# Patient Record
Sex: Female | Born: 1980 | Race: White | Hispanic: No | Marital: Married | Smoking: Never smoker
Health system: Southern US, Community
[De-identification: ages and names within clinical notes are randomized; demographics above are authoritative.]

## PROBLEM LIST (undated history)

## (undated) DIAGNOSIS — K219 Gastro-esophageal reflux disease without esophagitis: Secondary | ICD-10-CM

## (undated) DIAGNOSIS — E162 Hypoglycemia, unspecified: Secondary | ICD-10-CM

## (undated) DIAGNOSIS — R260 Ataxic gait: Secondary | ICD-10-CM

## (undated) DIAGNOSIS — M779 Enthesopathy, unspecified: Secondary | ICD-10-CM

## (undated) DIAGNOSIS — M5020 Other cervical disc displacement, unspecified cervical region: Secondary | ICD-10-CM

## (undated) DIAGNOSIS — G43909 Migraine, unspecified, not intractable, without status migrainosus: Secondary | ICD-10-CM

## (undated) DIAGNOSIS — N200 Calculus of kidney: Secondary | ICD-10-CM

## (undated) HISTORY — DX: Gastro-esophageal reflux disease without esophagitis: K21.9

## (undated) HISTORY — PX: WISDOM TOOTH EXTRACTION: SHX21

## (undated) HISTORY — PX: DILATION AND CURETTAGE OF UTERUS: SHX78

## (undated) HISTORY — DX: Hypoglycemia, unspecified: E16.2

## (undated) HISTORY — DX: Migraine, unspecified, not intractable, without status migrainosus: G43.909

## (undated) HISTORY — PX: ANKLE SURGERY: SHX546

---

## 2011-03-26 ENCOUNTER — Other Ambulatory Visit: Payer: Self-pay | Admitting: Family Medicine

## 2011-03-27 ENCOUNTER — Ambulatory Visit
Admission: RE | Admit: 2011-03-27 | Discharge: 2011-03-27 | Disposition: A | Discharge status: Home / Self Care | Payer: Self-pay | Source: Ambulatory Visit | Attending: Family Medicine | Admitting: Family Medicine

## 2011-03-27 IMAGING — US US ABDOMEN COMPLETE
1 series · 14 of 25 positions shown · non-contrast
Comparison: None.

CLINICAL DATA: Epigastric pain with nausea.

COMPLETE ABDOMINAL ULTRASOUND

[Series 1: us abdomen complete · 0.28mm/px · 14 of 73 slices shown]
[im 1/73]
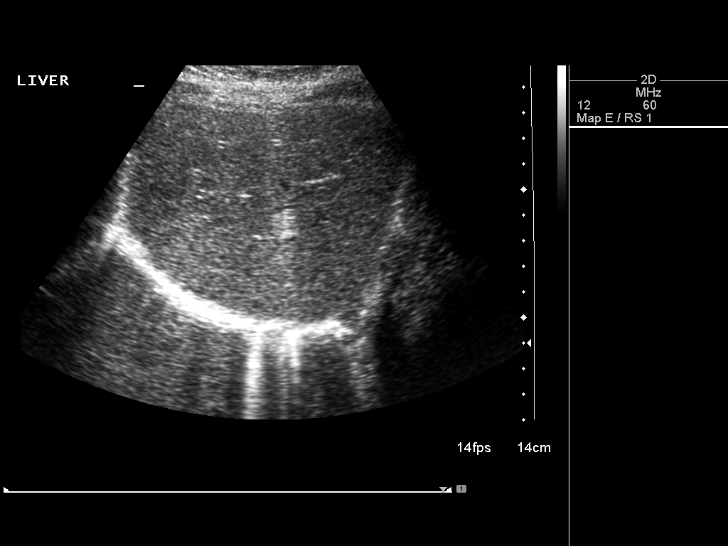
[im 7/73]
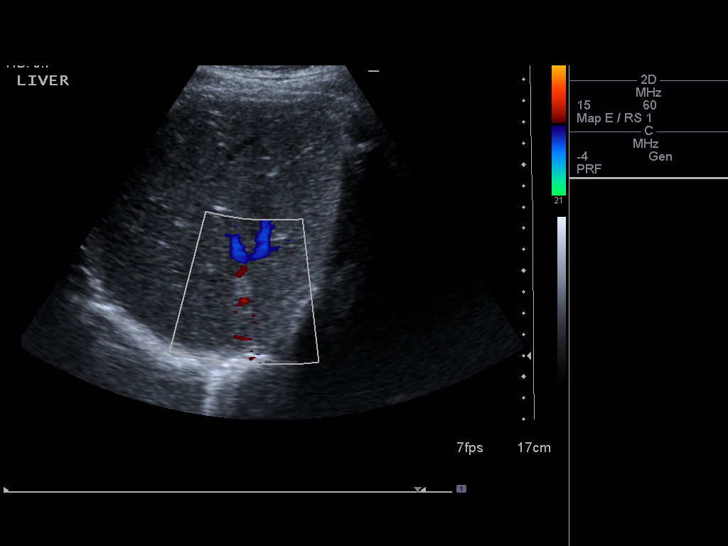
[im 13/73]
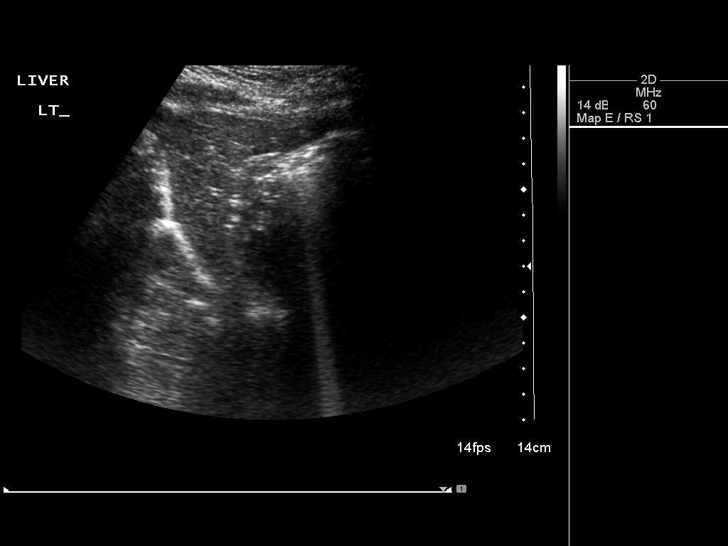
[im 19/73]
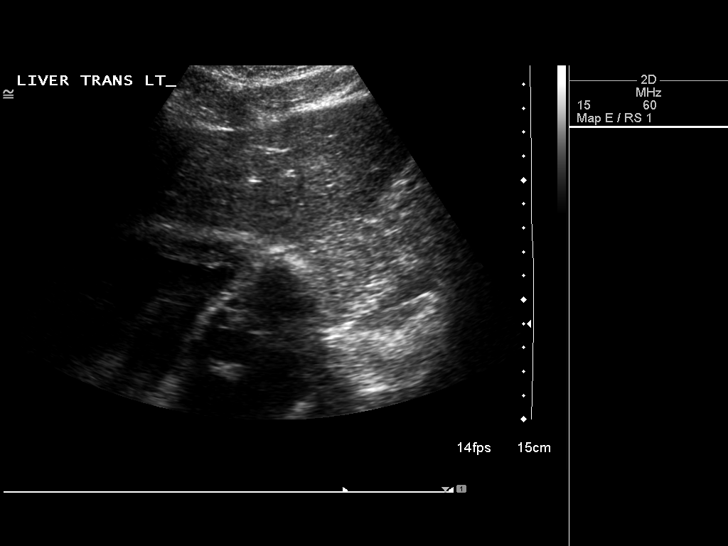
[im 25/73]
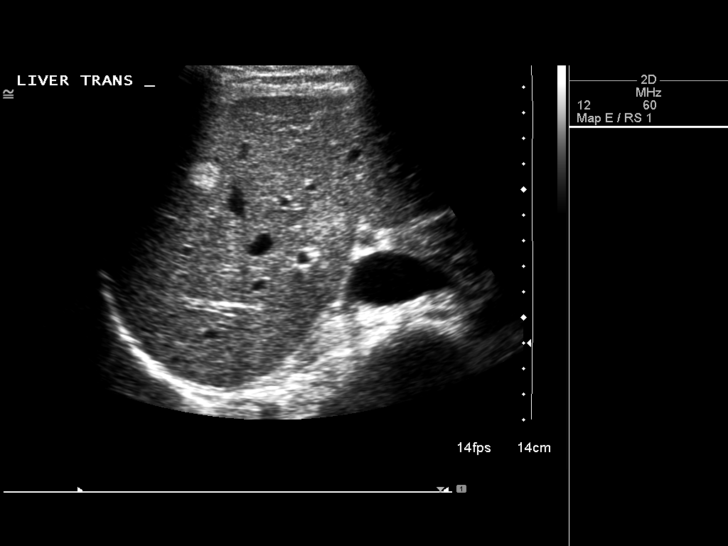
[im 28/73]
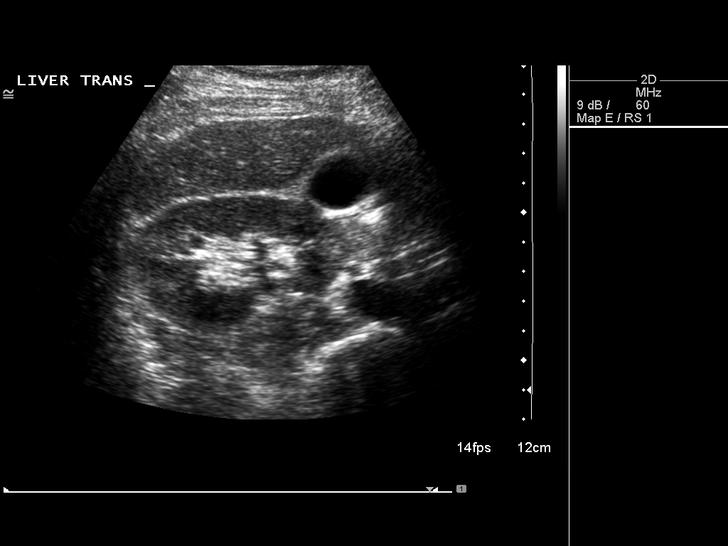
[im 34/73]
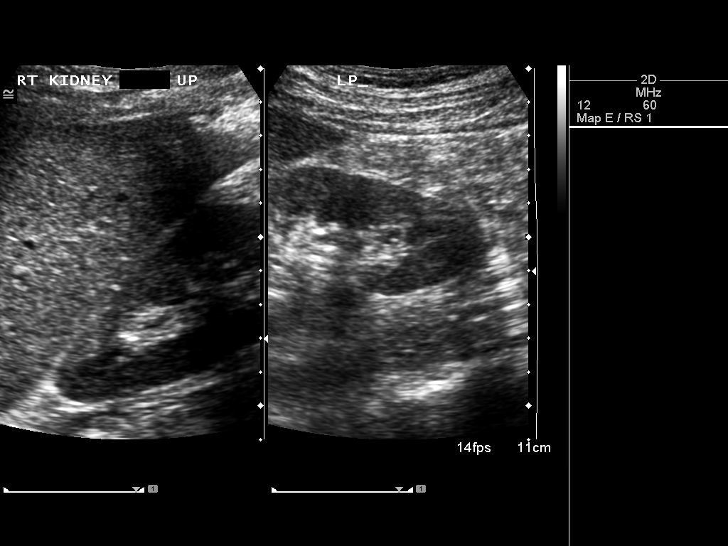
[im 40/73]
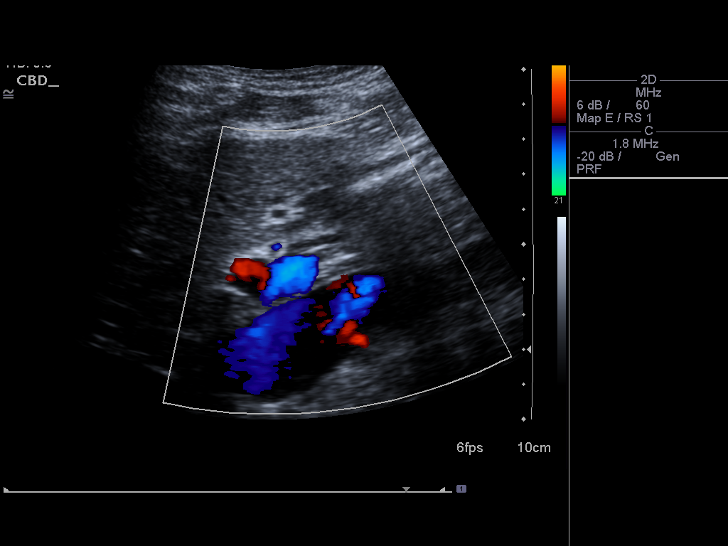
[im 46/73]
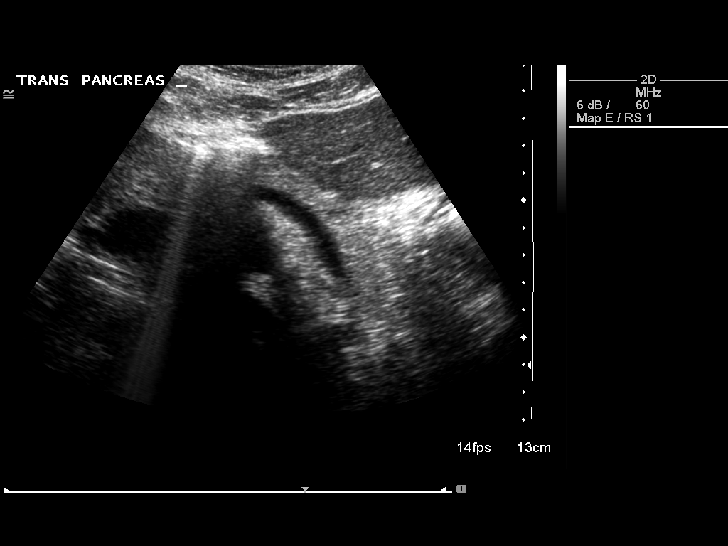
[im 49/73]
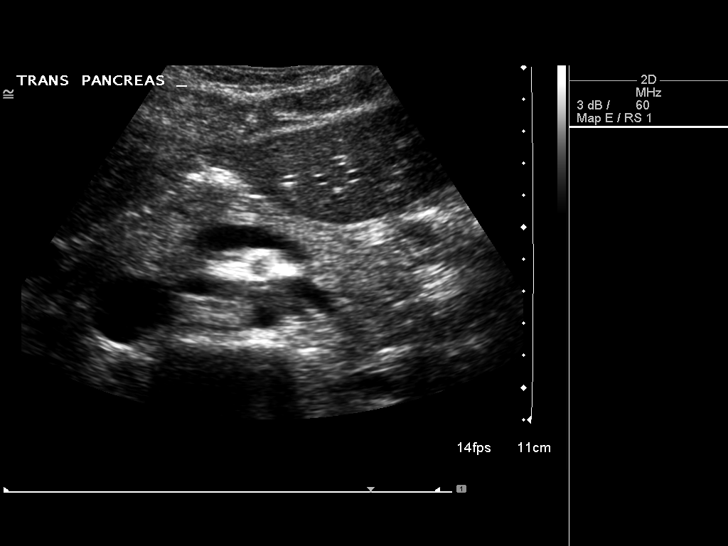
[im 55/73]
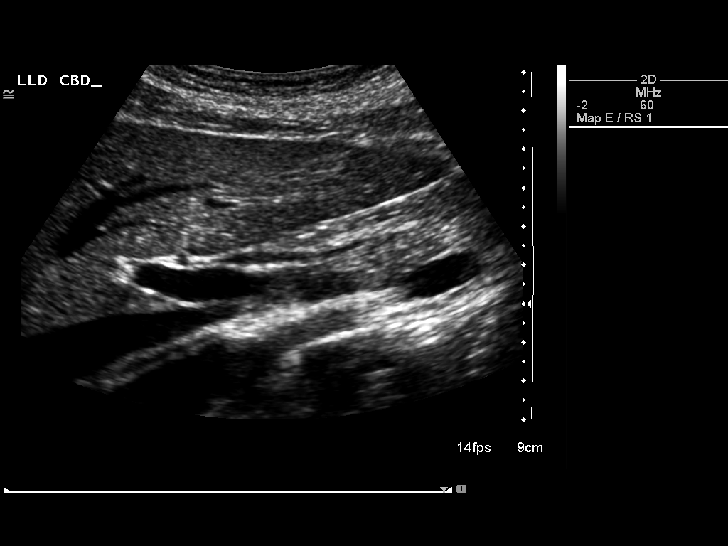
[im 61/73]
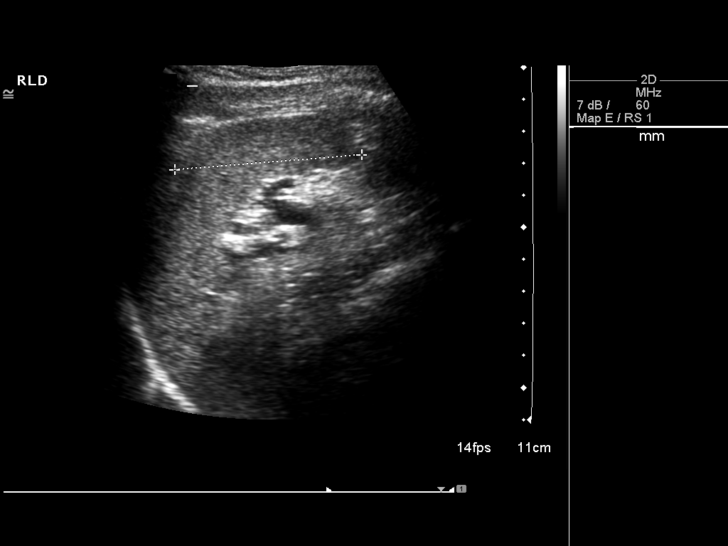
[im 67/73]
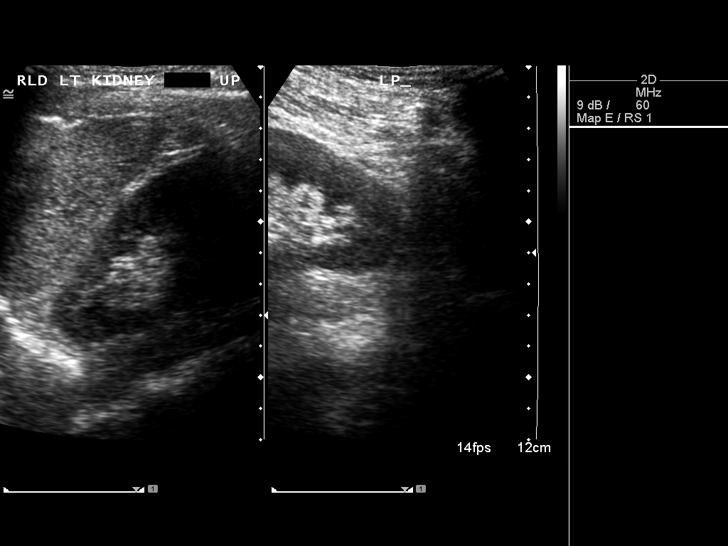
[im 73/73]
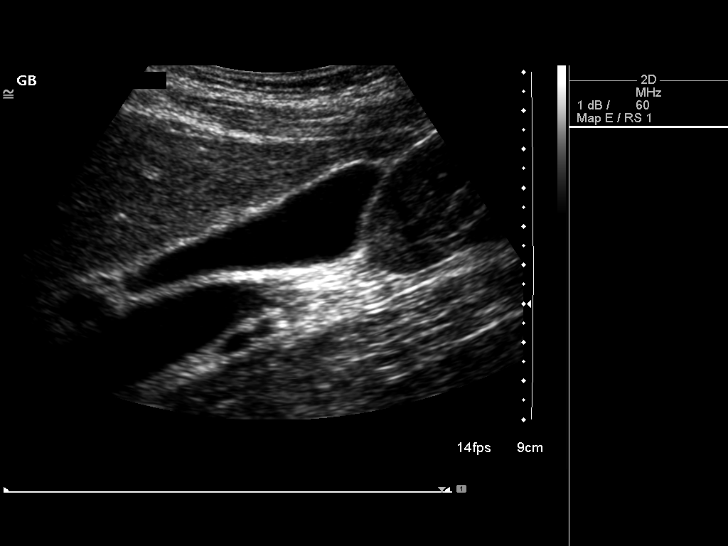

[14 of 25 positions shown; findings below may reference images not displayed]

FINDINGS: Gallbladder:  Negative.

Common bile duct:  Measures 3 mm, within normal limits.

Liver:  Uniform parenchymal echo texture with the exception of two
hyperechoic lesions in the right hepatic lobe, measuring up to
x 2.0 x 2.1 cm.  In the absence of known malignancy, these are
likely hemangiomas.

IVC:  Visualized.

Pancreas:  Negative.

Spleen:  Measures 5.6 cm, negative.

Right Kidney:  Measures 11.2 cm with uniform parenchymal echo
texture.  No hydronephrosis.

Left Kidney:  Measures 11.9 cm with uniform parenchymal echo
texture.  No hydronephrosis.

Abdominal aorta:  No aneurysm identified.
IMPRESSION: No acute findings.

## 2013-08-17 ENCOUNTER — Emergency Department (HOSPITAL_COMMUNITY): Payer: Managed Care, Other (non HMO)

## 2013-08-17 ENCOUNTER — Emergency Department (HOSPITAL_COMMUNITY)
Admission: EM | Admit: 2013-08-17 | Discharge: 2013-08-18 | Disposition: A | Discharge status: Home / Self Care | Payer: Managed Care, Other (non HMO) | Attending: Emergency Medicine | Admitting: Emergency Medicine

## 2013-08-17 ENCOUNTER — Encounter (HOSPITAL_COMMUNITY): Payer: Self-pay | Admitting: Emergency Medicine

## 2013-08-17 DIAGNOSIS — K529 Noninfective gastroenteritis and colitis, unspecified: Secondary | ICD-10-CM

## 2013-08-17 DIAGNOSIS — Z87442 Personal history of urinary calculi: Secondary | ICD-10-CM | POA: Insufficient documentation

## 2013-08-17 DIAGNOSIS — Z3202 Encounter for pregnancy test, result negative: Secondary | ICD-10-CM | POA: Insufficient documentation

## 2013-08-17 DIAGNOSIS — K5289 Other specified noninfective gastroenteritis and colitis: Secondary | ICD-10-CM | POA: Insufficient documentation

## 2013-08-17 DIAGNOSIS — Z79899 Other long term (current) drug therapy: Secondary | ICD-10-CM | POA: Insufficient documentation

## 2013-08-17 HISTORY — DX: Calculus of kidney: N20.0

## 2013-08-17 LAB — HEPATIC FUNCTION PANEL
ALT: 18 U/L (ref 0–35)
AST: 24 U/L (ref 0–37)
Albumin: 4 g/dL (ref 3.5–5.2)
Alkaline Phosphatase: 64 U/L (ref 39–117)
Bilirubin, Direct: 0.2 mg/dL (ref 0.0–0.3)
Indirect Bilirubin: 0.6 mg/dL (ref 0.3–0.9)
Total Bilirubin: 0.8 mg/dL (ref 0.3–1.2)
Total Protein: 7.1 g/dL (ref 6.0–8.3)

## 2013-08-17 LAB — CBC WITH DIFFERENTIAL/PLATELET
Basophils Absolute: 0 10*3/uL (ref 0.0–0.1)
Basophils Relative: 0 % (ref 0–1)
Eosinophils Relative: 0 % (ref 0–5)
HCT: 41 % (ref 36.0–46.0)
MCHC: 34.4 g/dL (ref 30.0–36.0)
MCV: 96.5 fL (ref 78.0–100.0)
Monocytes Absolute: 0.5 10*3/uL (ref 0.1–1.0)
Monocytes Relative: 4 % (ref 3–12)
RDW: 12.3 % (ref 11.5–15.5)

## 2013-08-17 LAB — URINALYSIS, ROUTINE W REFLEX MICROSCOPIC
Bilirubin Urine: NEGATIVE
Glucose, UA: NEGATIVE mg/dL
Hgb urine dipstick: NEGATIVE
Ketones, ur: >80 mg/dL — AB
Leukocytes, UA: NEGATIVE
Protein, ur: NEGATIVE mg/dL
Specific Gravity, Urine: 1.024 (ref 1.005–1.030)

## 2013-08-17 LAB — BASIC METABOLIC PANEL
BUN: 12 mg/dL (ref 6–23)
CO2: 24 mEq/L (ref 19–32)
Calcium: 9.3 mg/dL (ref 8.4–10.5)
Creatinine, Ser: 0.61 mg/dL (ref 0.50–1.10)
GFR calc Af Amer: >90 mL/min (ref >90)

## 2013-08-17 LAB — WET PREP, GENITAL: Trich, Wet Prep: NONE SEEN

## 2013-08-17 LAB — LIPASE, BLOOD: Lipase: 15 U/L (ref 11–59)

## 2013-08-17 LAB — POCT PREGNANCY, URINE: Preg Test, Ur: NEGATIVE

## 2013-08-17 IMAGING — US US ABDOMEN COMPLETE
1 series · 14 of 25 positions shown · non-contrast
Comparison: [DATE]

CLINICAL DATA: Abdominal pain

EXAM:
ULTRASOUND ABDOMEN COMPLETE

[Series 1: us abdomen complete · 0.24mm/px · 14 of 85 slices shown]
[im 1/85]
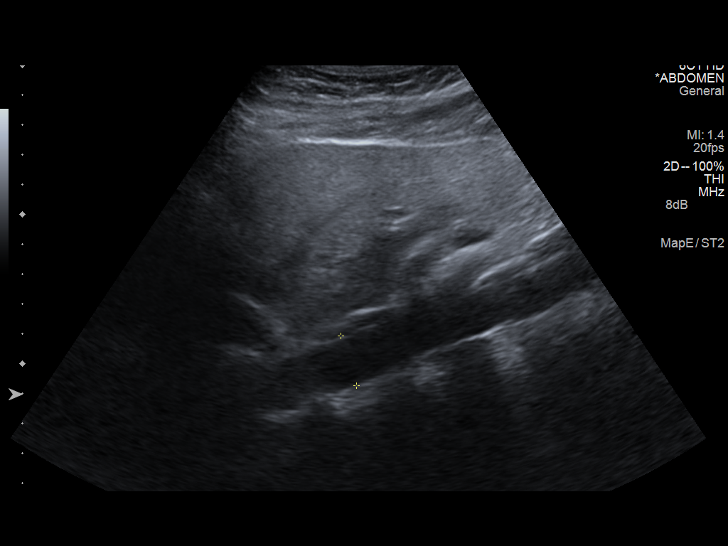
[im 8/85]
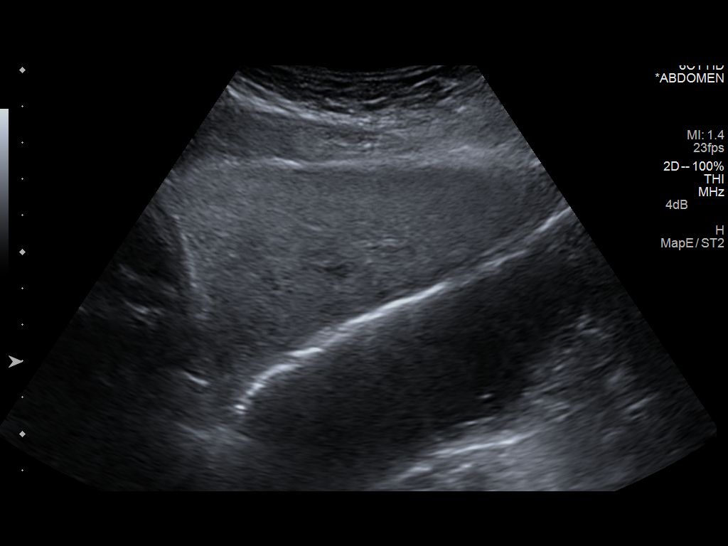
[im 15/85]
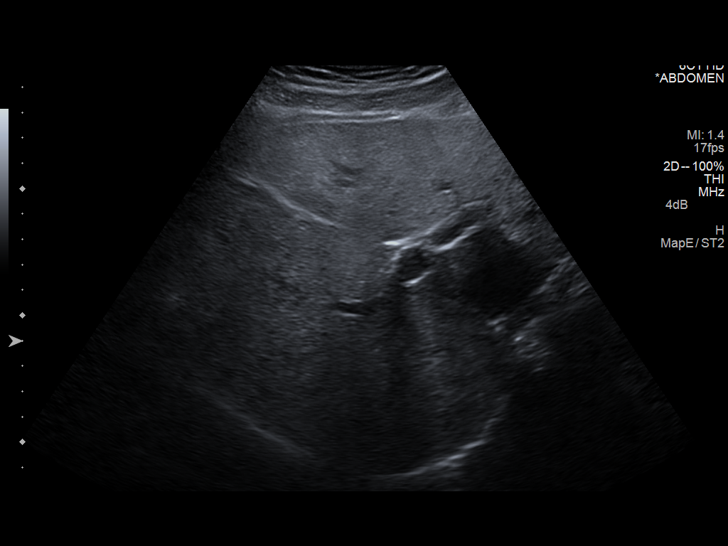
[im 22/85]
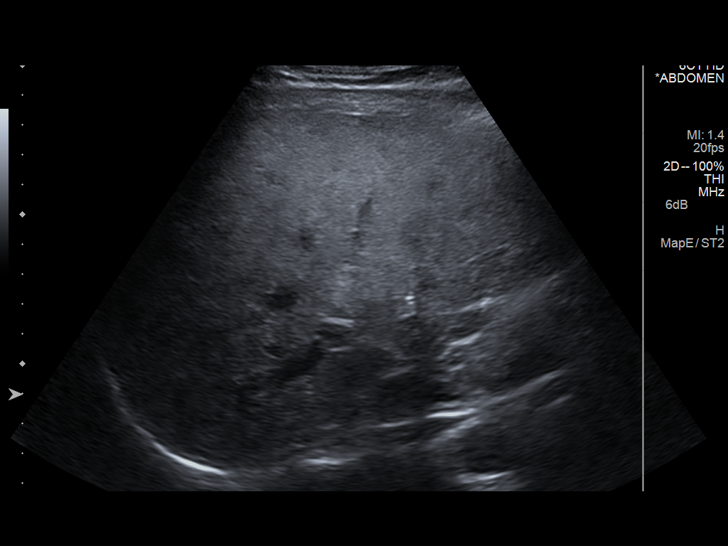
[im 29/85]
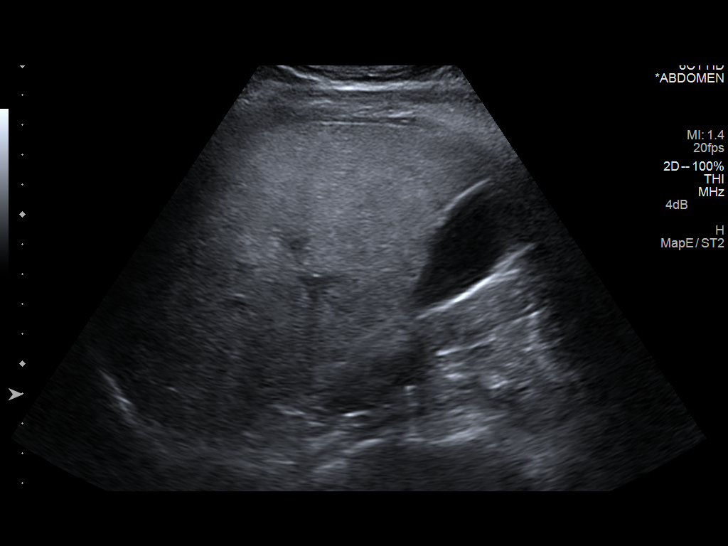
[im 32/85]
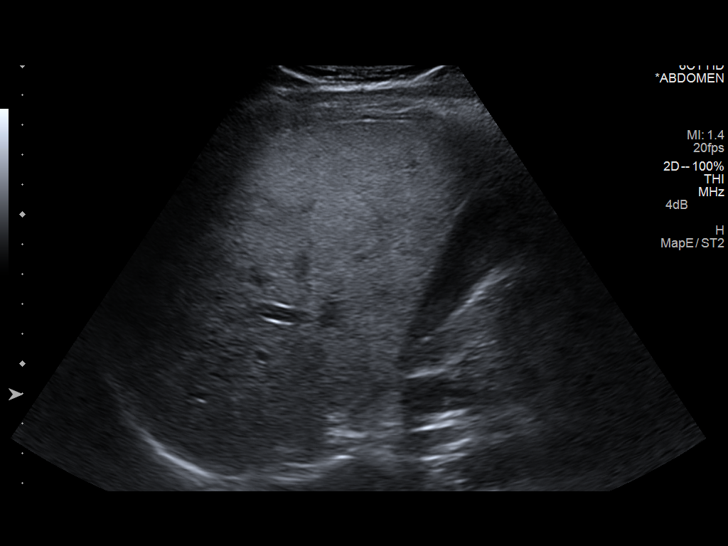
[im 39/85]
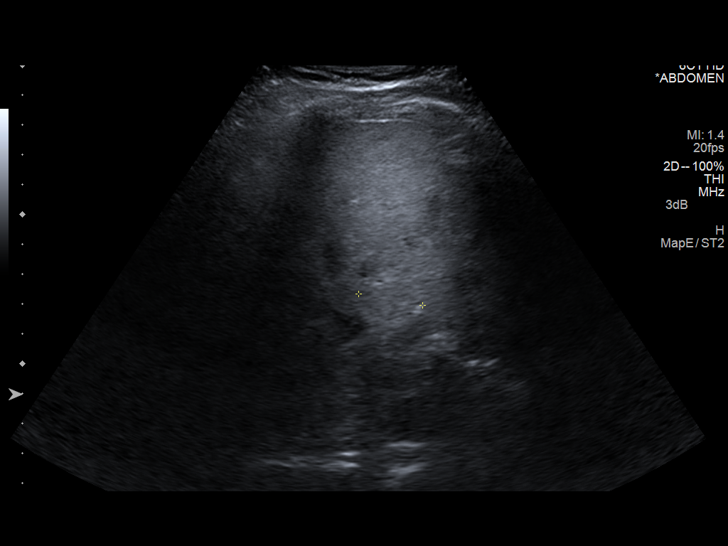
[im 46/85]
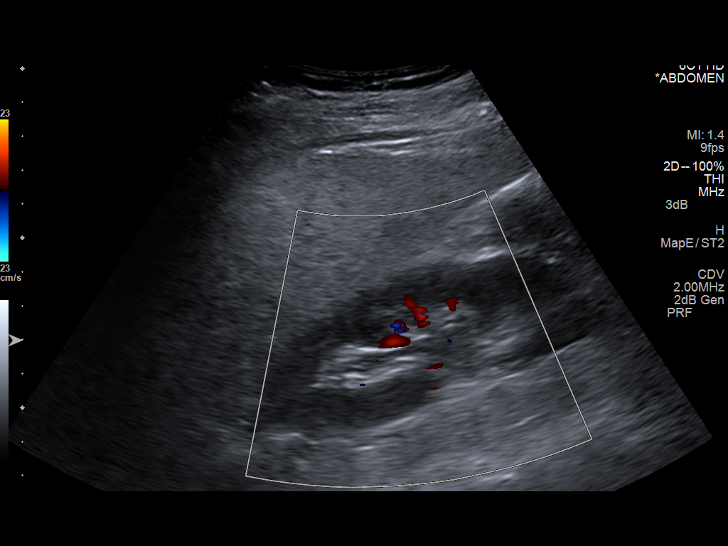
[im 53/85]
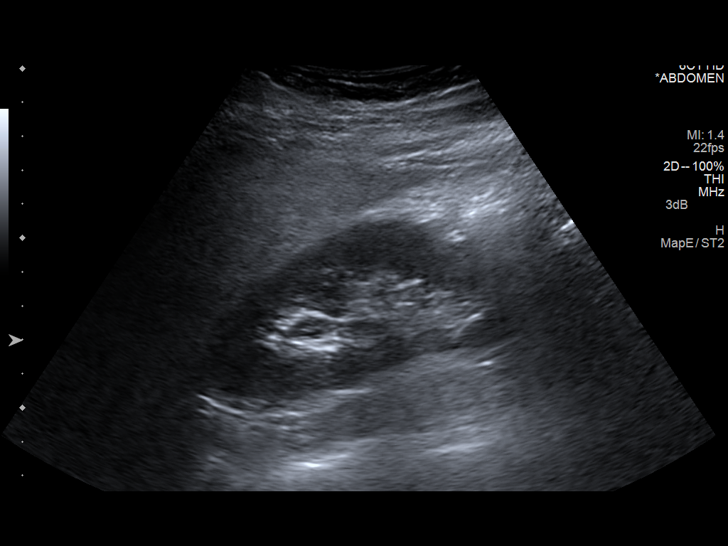
[im 57/85]
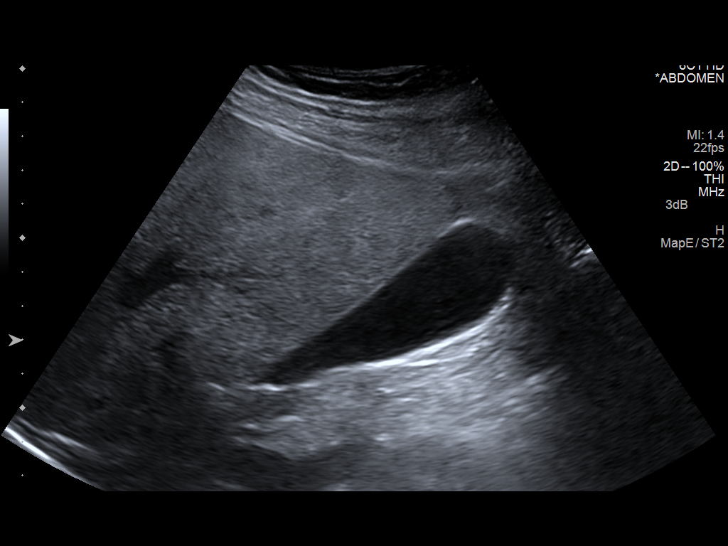
[im 64/85]
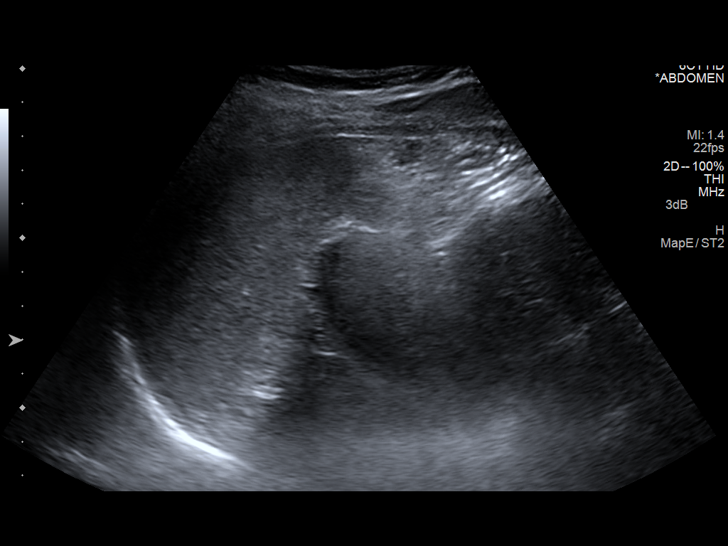
[im 71/85]
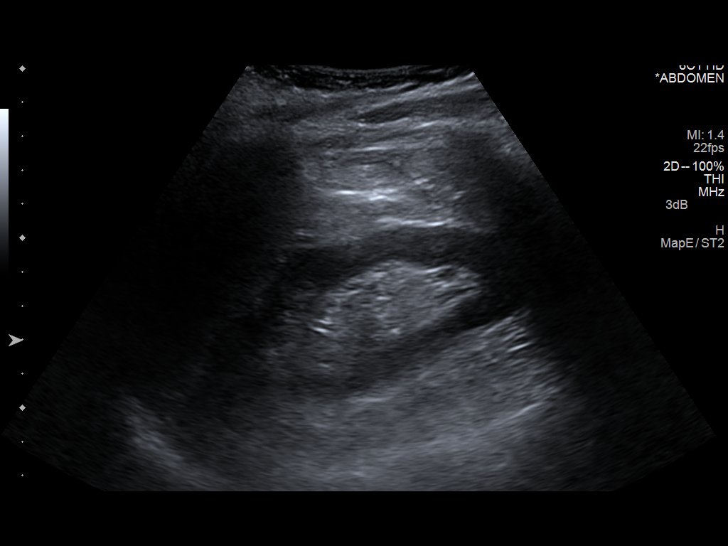
[im 78/85]
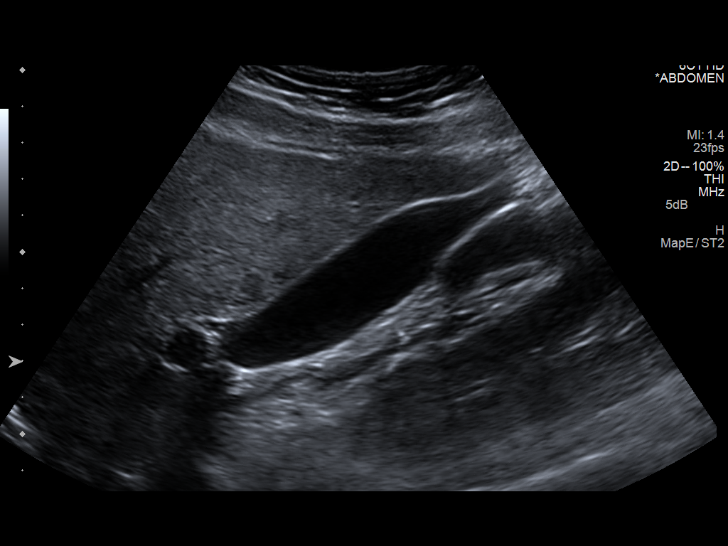
[im 85/85]
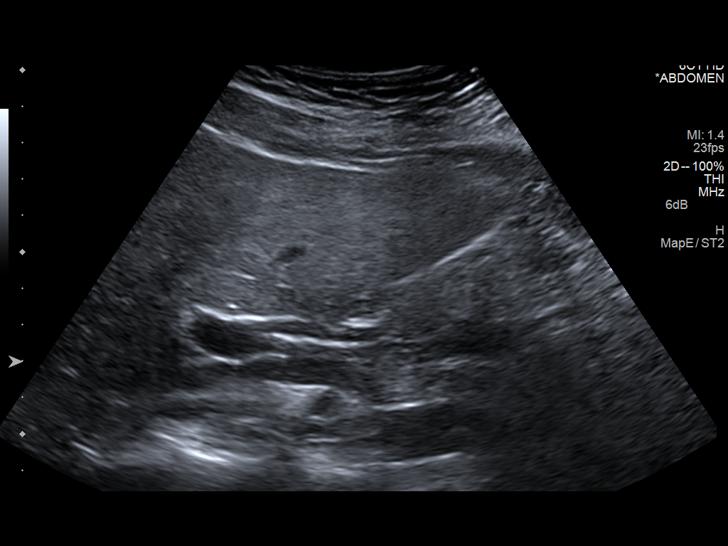

[14 of 25 positions shown; findings below may reference images not displayed]

FINDINGS: Gallbladder

No gallstones or wall thickening visualized. No sonographic Murphy
sign noted.

Common bile duct

Diameter: 4 mm

Liver

There is somewhat heterogeneous attenuation diffusely with a rounded
hyperechoic area measuring about 2 cm centrally in the right lobe.
This area appears similar to the prior study without significant
interval change.

IVC

No abnormality visualized.

Pancreas

Visualized portion unremarkable.

Spleen

Size and appearance within normal limits.

Right Kidney

Length: 10.6 cm Echogenicity within normal limits. No mass or
hydronephrosis visualized.

Left Kidney

Length: 10.8 cm Echogenicity within normal limits. No mass or
hydronephrosis visualized.

Abdominal aorta

No aneurysm visualized.
IMPRESSION: No acute abnormalities. Stable hyperechoic area of right liver
likely a hemangioma given the stability.

## 2013-08-17 IMAGING — CT CT ABD-PELV W/ CM
2 of 3 series · 12 of 36 positions shown, 17 images · IV contrast (OMNIPAQUE 300)
Comparison: Ultrasound [DATE]

CLINICAL DATA: Lower pelvic cramping.

EXAM:
CT ABDOMEN AND PELVIS WITH CONTRAST
TECHNIQUE: Multidetector CT imaging of the abdomen and pelvis was performed
using the standard protocol following bolus administration of
intravenous contrast.
CONTRAST:  100mL OMNIPAQUE IOHEXOL 300 MG/ML  SOLN

[Series 2: abd/pel with · axial · 0.83mm/px · z∈[+1158,+1544]mm · 11 of 89 slices shown, 15 images]
[im 8/89  soft-tissue]
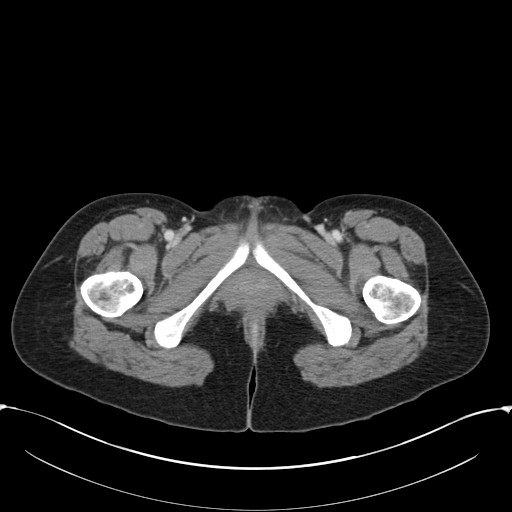
[im 8/89  bone]
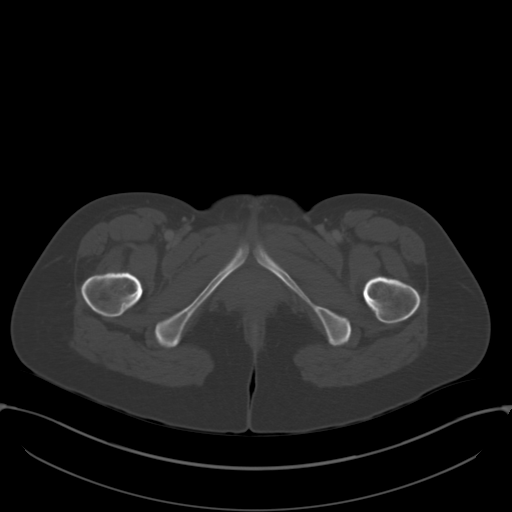
[im 16/89  soft-tissue]
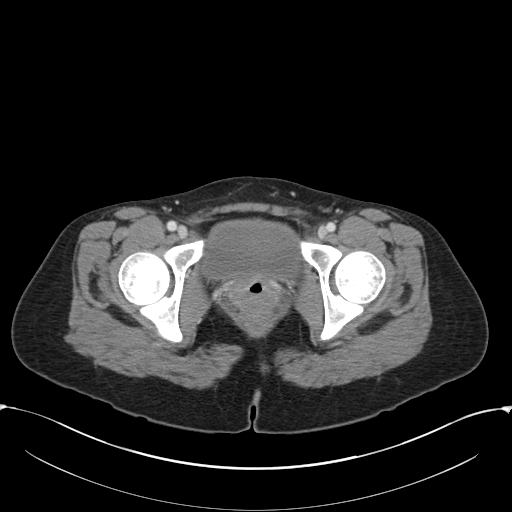
[im 27/89  soft-tissue]
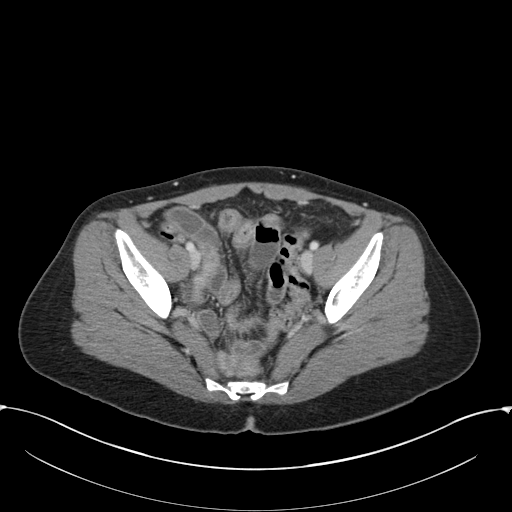
[im 35/89  soft-tissue]
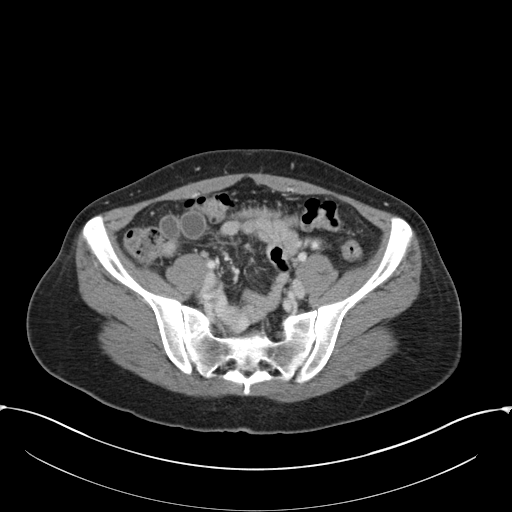
[im 46/89  soft-tissue]
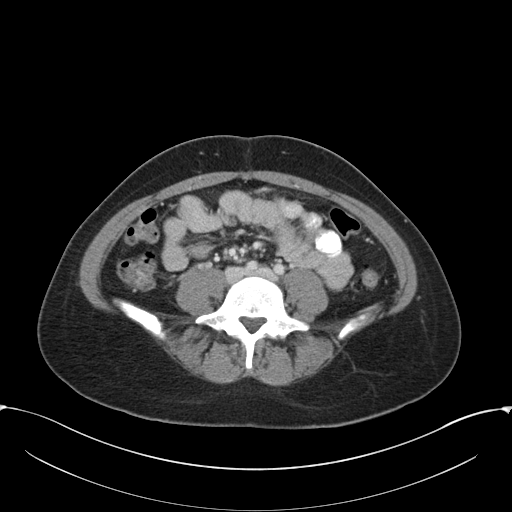
[im 54/89  soft-tissue]
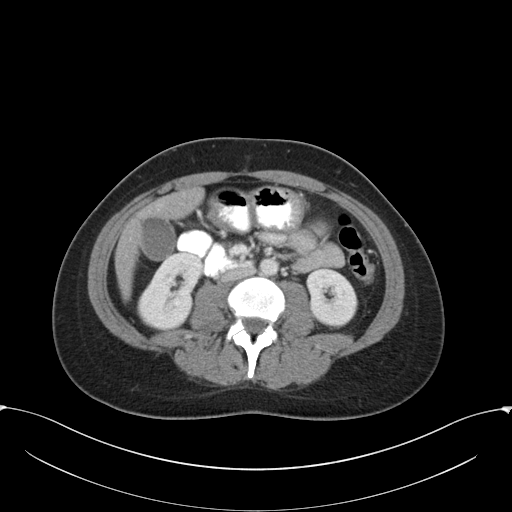
[im 62/89  soft-tissue]
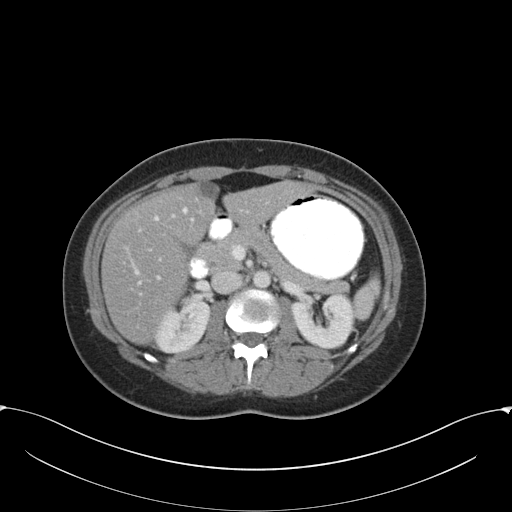
[im 73/89  soft-tissue]
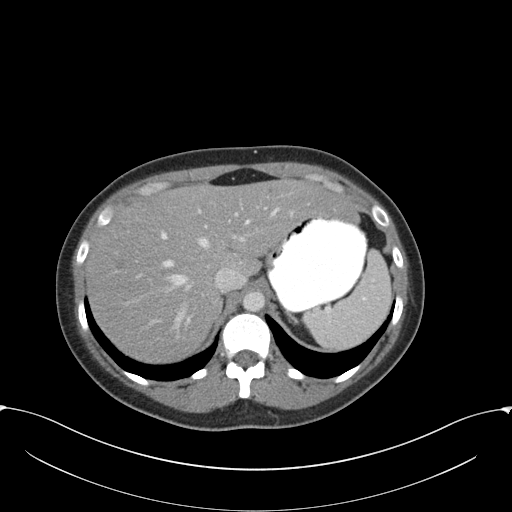
[im 73/89  lung]
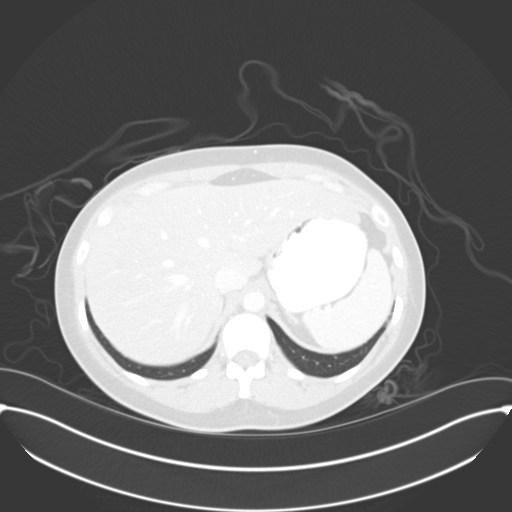
[im 77/89  lung]
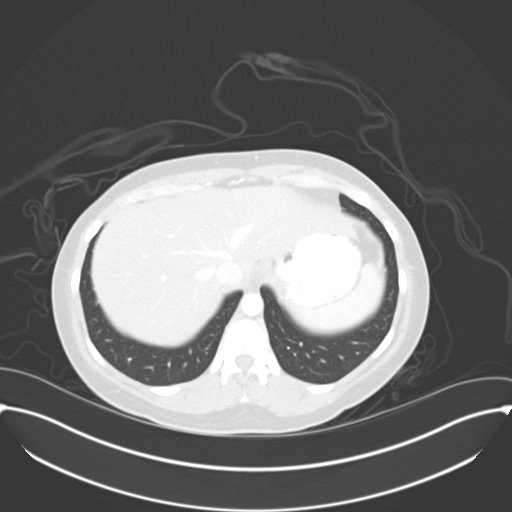
[im 81/89  soft-tissue]
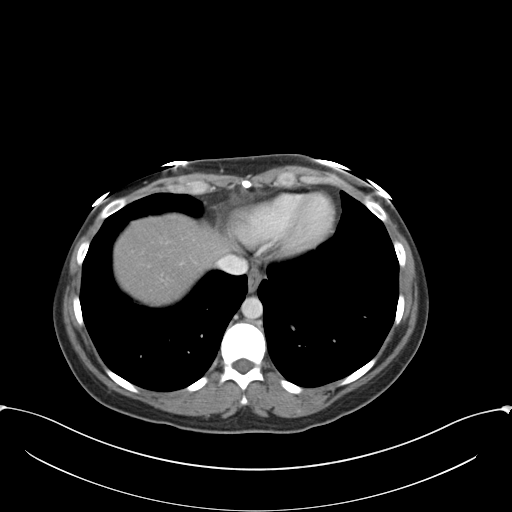
[im 81/89  lung]
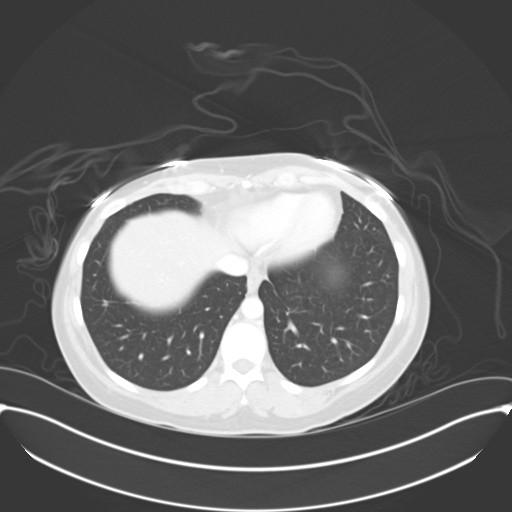
[im 81/89  bone]
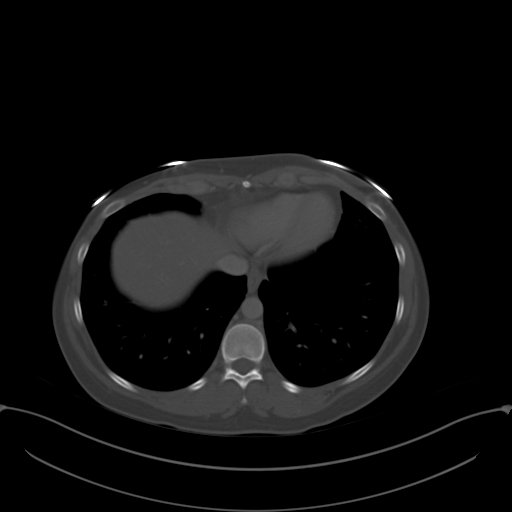
[im 85/89  lung]
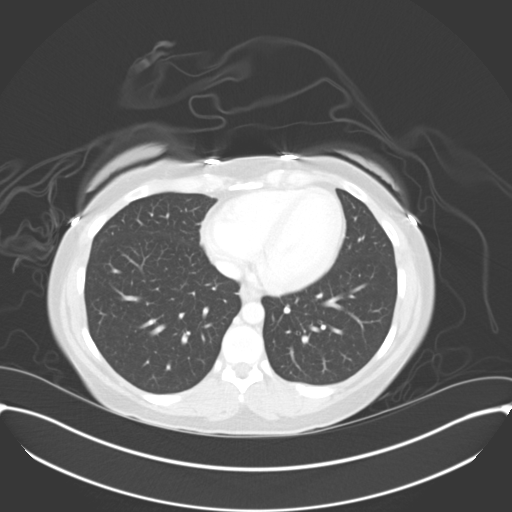

[Series 4: sagittal a/ p · sagittal · 0.54mm/px · 1 of 127 slices shown, 2 images]
[im 43/127  soft-tissue]
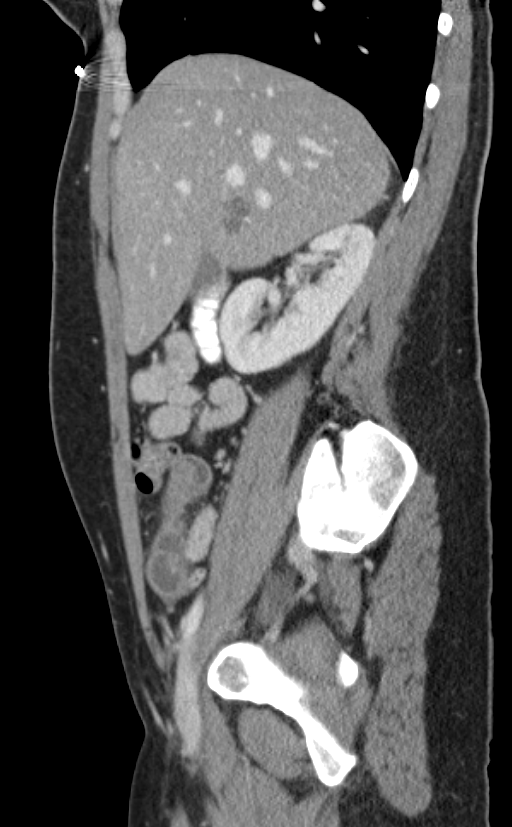
[im 43/127  bone]
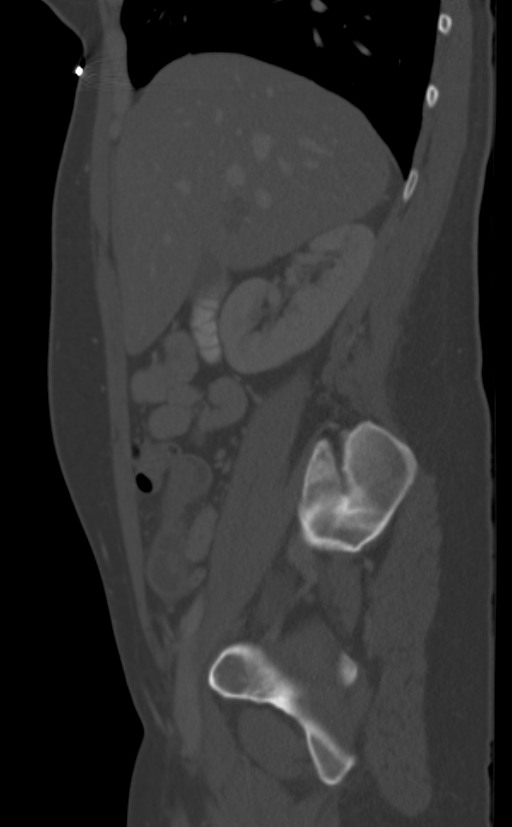

[12 of 36 positions shown; findings below may reference images not displayed]

FINDINGS: Lung bases are clear.  Negative for free air.

Low-attenuation of liver is suggestive for hepatic steatosis. There
is a 2.3 cm low-density structure within the medial right hepatic
lobe. There appears to have internal vascularity. Structure is
echogenic on the recent ultrasound and most likely represents a
cavernous hemangioma. Focal low-density area along the medial left
hepatic lobe may represent focal fat. There are additional subtle
low-density areas in the liver that are nonspecific.

Portal venous system is patent. Normal appearance of the
gallbladder. Normal appearance of the spleen. Normal appearance of
the adrenal glands, kidneys and pancreas. No significant
lymphadenopathy in the abdomen or pelvis. Small amount of free fluid
in the pelvis. The appendix appears normal. There are some
fluid-filled loops of small bowel in the pelvis with wall
enhancement. In addition, few of the small bowel loops demonstrate
mild wall thickening. Findings raise concern for small bowel
enteritis. No gross abnormality to the uterus. Difficult to evaluate
the adnexal structures or ovaries. There may be a small right corpus
luteal cyst. No acute bone abnormality.
IMPRESSION: The small bowel structures in the pelvis are slightly hyperemic and
a few loops demonstrate mild wall thickening. There is also a small
amount of free fluid in the pelvis. Findings could represent
enteritis.

Low-density of the liver is suggestive for hepatic steatosis. There
are low-density structures in the liver which are probably
incidental findings in a patient of this age. Largest structure
measures up to 2.3 cm and suspect this represents a cavernous
hemangioma based on the previous ultrasound findings.

## 2013-08-17 MED ORDER — OXYCODONE-ACETAMINOPHEN 5-325 MG PO TABS
1.0000 | ORAL_TABLET | Freq: Once | ORAL | Status: AC
  Administered 2013-08-17: 1 via ORAL
  Filled 2013-08-17: qty 1

## 2013-08-17 MED ORDER — ONDANSETRON HCL 4 MG/2ML IJ SOLN
4.0000 mg | Freq: Once | INTRAMUSCULAR | Status: AC
  Administered 2013-08-17: 4 mg via INTRAVENOUS
  Filled 2013-08-17: qty 2

## 2013-08-17 MED ORDER — ONDANSETRON HCL 4 MG/2ML IJ SOLN
4.0000 mg | Freq: Once | INTRAMUSCULAR | Status: AC
Start: 2013-08-17 — End: 2013-08-17
  Administered 2013-08-17: 4 mg via INTRAVENOUS
  Filled 2013-08-17: qty 2

## 2013-08-17 MED ORDER — IOHEXOL 300 MG/ML  SOLN
50.0000 mL | Freq: Once | INTRAMUSCULAR | Status: AC | PRN
  Administered 2013-08-17: 50 mL via ORAL

## 2013-08-17 MED ORDER — MORPHINE SULFATE 4 MG/ML IJ SOLN
4.0000 mg | Freq: Once | INTRAMUSCULAR | Status: AC
  Administered 2013-08-17: 4 mg via INTRAVENOUS
  Filled 2013-08-17: qty 1

## 2013-08-17 MED ORDER — IOHEXOL 300 MG/ML  SOLN
100.0000 mL | Freq: Once | INTRAMUSCULAR | Status: AC | PRN
  Administered 2013-08-17: 100 mL via INTRAVENOUS

## 2013-08-17 MED ORDER — HYDROCODONE-ACETAMINOPHEN 5-325 MG PO TABS: 1.0000 | ORAL_TABLET | ORAL | Status: DC | PRN

## 2013-08-17 MED ORDER — SODIUM CHLORIDE 0.9 % IV BOLUS (SEPSIS)
1000.0000 mL | Freq: Once | INTRAVENOUS | Status: AC
  Administered 2013-08-17: 1000 mL via INTRAVENOUS

## 2013-08-17 MED ORDER — ONDANSETRON HCL 4 MG PO TABS: 4.0000 mg | ORAL_TABLET | Freq: Four times a day (QID) | ORAL | Status: DC

## 2013-08-17 NOTE — ED Notes (Signed)
Per pt, woke up with abdominal pain/spasms-went to walk in clinic and they sent her here for eval

## 2013-08-17 NOTE — ED Notes (Signed)
US at bedside

## 2013-08-17 NOTE — ED Provider Notes (Signed)
CSN: [id number]     Arrival date & time 08/17/13  1508 History   First MD Initiated Contact with Patient 08/17/13 1641     Chief Complaint  Patient presents with  . Abdominal Pain   (Consider location/radiation/quality/duration/timing/severity/associated sxs/prior Treatment) HPI  32 year old female with history of kidney stones presents complaining of abdominal pain. Patient reports acute onset of sharp pain to the upper abdomen which wakes up this morning followed by episodes of muscle spasm. Pain radiates across her abdomen, has been persistent, causing her have nausea and has vomited 4 or 5 times. Vomitus is nonbloody, nonbilious. Nothing seems to make the pain better, abdominal spasm seems to make it worse. No specific treatment tried. Continues to endorse nausea. She was seen at the walk-in clinic today and was noted to have a low-grade fever. She was sent to ER for further evaluation. Patient denies headache, sneeze, cough, chest pain, shortness of breath , back pain, shoulder pain, dysuria, hematuria, vaginal bleeding, vaginal discharge, or rash. Her last menstrual period was September 21. Patient reports she has history of kidney stones but this felt different. Patient admits to taking ibuprofen on a regular basis for neck pain. She also admits to drinking 1-2 glasses of wine 4-5 times a week. She denies any history of GERD. Denies any abdominal surgery. Her last bowel movement was this morning and was unremarkable.  Past Medical History  Diagnosis Date  . Kidney stones    Past Surgical History  Procedure Laterality Date  . Ankle surgery     No family history on file. History  Substance Use Topics  . Smoking status: Never Smoker   . Smokeless tobacco: Not on file  . Alcohol Use: Yes   OB History   Grav Para Term Preterm Abortions TAB SAB Ect Mult Living                 Review of Systems  All other systems reviewed and are negative.    Allergies  Review of patient's  allergies indicates no known allergies.  Home Medications   Current Outpatient Rx  Name  Route  Sig  Dispense  Refill  . FLUoxetine (PROZAC) 20 MG capsule   Oral   Take 20 mg by mouth daily.         Marland Kitchen lidocaine (LIDODERM) 5 %   Transdermal   Place 1 patch onto the skin daily. Remove & Discard patch within 12 hours or as directed by MD          BP 134/88  Pulse 102  Temp(Src) 99.4 F (37.4 C) (Oral)  Resp 18  SpO2 100%  LMP 07/25/2013 Physical Exam  Nursing note and vitals reviewed. Constitutional: She appears well-developed and well-nourished. No distress.  HENT:  Head: Normocephalic and atraumatic.  Eyes: Conjunctivae are normal.  Neck: Normal range of motion. Neck supple.  Cardiovascular: Normal rate and regular rhythm.   Pulmonary/Chest: Effort normal and breath sounds normal. She exhibits no tenderness.  Abdominal: Soft. There is tenderness (diffuse abdominal tenderness on palpation without focal point tenderness. No guarding or rebound tenderness. No hernia noted. No overlying skin changes.).  Genitourinary: Vagina normal and uterus normal. There is no rash or lesion on the right labia. There is no rash or lesion on the left labia. Cervix exhibits no motion tenderness and no discharge. Right adnexum displays no mass and no tenderness. Left adnexum displays no mass and no tenderness. No erythema, tenderness or bleeding around the vagina. No vaginal discharge found.  Chaperone present:  Lymphadenopathy:       Right: No inguinal adenopathy present.       Left: No inguinal adenopathy present.    ED Course  Procedures (including critical care time)  5:09 PM Patient with abdominal pain. Pain is nonfocal. Workup initiated.  No significant discomfort on pelvic exam.    10:00 PM Ultrasound shows no acute finding. There is a hyperechoic area of the right liver likely a hemangioma, and not related to the patient's present symptoms. Abdominal and pelvis CT scan were  performed showing evidence of enteritis. Otherwise the appendix was seen and normal in size. Patient does endorse abdominal pain. Ketones is greater than 80 urine however she has received fluids here, she request for fluid to drain. We'll continue to monitor patient and can provide symptom control. I anticipate patient can be discharged. Patient voiced understanding and agrees with plan.  11:24 PM Patient reports feeling better, able to tolerates by mouth. The patient stable for discharge. Her symptoms likely viral. I do not think an antibiotic is necessary at this time. Return precautions discussed. Will discharge patient with antinausea medication and pain medication. No recent travel concerning for ebola virus.    Labs Review Labs Reviewed  WET PREP, GENITAL - Abnormal; Notable for the following:    Yeast Wet Prep HPF POC FEW (*)    Clue Cells Wet Prep HPF POC FEW (*)    WBC, Wet Prep HPF POC FEW (*)    All other components within normal limits  CBC WITH DIFFERENTIAL - Abnormal; Notable for the following:    WBC 11.9 (*)    Neutrophils Relative % 92 (*)    Neutro Abs 10.9 (*)    Lymphocytes Relative 4 (*)    Lymphs Abs 0.5 (*)    All other components within normal limits  URINALYSIS, ROUTINE W REFLEX MICROSCOPIC - Abnormal; Notable for the following:    Ketones, ur >80 (*)    All other components within normal limits  GC/CHLAMYDIA PROBE AMP  BASIC METABOLIC PANEL  LIPASE, BLOOD  HEPATIC FUNCTION PANEL  POCT PREGNANCY, URINE   Imaging Review US Abdomen Complete  08/17/2013   CLINICAL DATA:  Abdominal pain  EXAM: ULTRASOUND ABDOMEN COMPLETE  COMPARISON:  03/27/2011  FINDINGS: Gallbladder  No gallstones or wall thickening visualized. No sonographic Murphy sign noted.  Common bile duct  Diameter: 4 mm  Liver  There is somewhat heterogeneous attenuation diffusely with a rounded hyperechoic area measuring about 2 cm centrally in the right lobe. This area appears similar to the prior  study without significant interval change.  IVC  No abnormality visualized.  Pancreas  Visualized portion unremarkable.  Spleen  Size and appearance within normal limits.  Right Kidney  Length: 10.6 cm Echogenicity within normal limits. No mass or hydronephrosis visualized.  Left Kidney  Length: 10.8 cm Echogenicity within normal limits. No mass or hydronephrosis visualized.  Abdominal aorta  No aneurysm visualized.  IMPRESSION: No acute abnormalities. Stable hyperechoic area of right liver likely a hemangioma given the stability.   Electronically Signed   By: Esperanza Heir M.D.   On: 08/17/2013 19:17   Ct Abdomen Pelvis W Contrast  08/17/2013   CLINICAL DATA:  Lower pelvic cramping.  EXAM: CT ABDOMEN AND PELVIS WITH CONTRAST  TECHNIQUE: Multidetector CT imaging of the abdomen and pelvis was performed using the standard protocol following bolus administration of intravenous contrast.  CONTRAST:  OMNIPAQUE IOHEXOL 300 MG/ML  SOLN  COMPARISON:  Ultrasound  08/17/2013  FINDINGS: Lung bases are clear.  Negative for free air.  Low-attenuation of liver is suggestive for hepatic steatosis. There is a 2.3 cm low-density structure within the medial right hepatic lobe. There appears to have internal vascularity. 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 Focal low-density area along the medial left hepatic lobe may represent focal fat. There are additional subtle low-density areas in the liver that are nonspecific.  Portal venous system is patent. Normal appearance of the gallbladder. Normal appearance of the spleen. Normal appearance of the adrenal glands, kidneys and pancreas. No significant lymphadenopathy in the abdomen or pelvis. Small amount of free fluid in the pelvis. The appendix appears normal. There are some fluid-filled loops of small bowel in the pelvis with wall enhancement. In addition, few of the small bowel loops demonstrate mild wall  thickening. Findings raise concern for small bowel enteritis. No gross abnormality to the uterus. Difficult to evaluate the adnexal structures or ovaries. There may be a small right corpus luteal cyst. No acute bone abnormality.  IMPRESSION: The small bowel structures in the pelvis are slightly hyperemic and a few loops demonstrate mild wall thickening. There is also a small amount of free fluid in the pelvis. Findings could represent enteritis.  Low-density of the liver is suggestive for hepatic steatosis. There are low-density structures in the liver which are probably incidental findings in a patient of this age. 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   Electronically Signed   By: Richarda Overlie M.D.   On: 08/17/2013 21:16    EKG Interpretation   None       MDM   1. Enteritis    BP 134/88  Pulse 102  Temp(Src) 99.4 F (37.4 C) (Oral)  Resp 18  SpO2 100%  LMP 07/25/2013  I have reviewed nursing notes and vital signs. I personally reviewed the imaging tests through PACS system  I reviewed available ER/hospitalization records thought the EMR   =  Fayrene Helper, PA-C 08/17/13 2336

## 2013-08-18 LAB — GC/CHLAMYDIA PROBE AMP: GC Probe RNA: NEGATIVE

## 2013-08-18 NOTE — ED Provider Notes (Signed)
Medical screening examination/treatment/procedure(s) were performed by non-physician practitioner and as supervising physician I was immediately available for consultation/collaboration.   Junius Argyle, MD 08/18/13 1309

## 2014-10-09 ENCOUNTER — Emergency Department (HOSPITAL_COMMUNITY)
Admission: EM | Admit: 2014-10-09 | Discharge: 2014-10-09 | Disposition: A | Discharge status: Home / Self Care | Payer: No Typology Code available for payment source | Attending: Emergency Medicine | Admitting: Emergency Medicine

## 2014-10-09 ENCOUNTER — Encounter (HOSPITAL_COMMUNITY): Payer: Self-pay | Admitting: Emergency Medicine

## 2014-10-09 DIAGNOSIS — Y92410 Unspecified street and highway as the place of occurrence of the external cause: Secondary | ICD-10-CM | POA: Insufficient documentation

## 2014-10-09 DIAGNOSIS — Z79899 Other long term (current) drug therapy: Secondary | ICD-10-CM | POA: Diagnosis not present

## 2014-10-09 DIAGNOSIS — S0990XA Unspecified injury of head, initial encounter: Secondary | ICD-10-CM | POA: Diagnosis not present

## 2014-10-09 DIAGNOSIS — S161XXA Strain of muscle, fascia and tendon at neck level, initial encounter: Secondary | ICD-10-CM | POA: Diagnosis not present

## 2014-10-09 DIAGNOSIS — Y9389 Activity, other specified: Secondary | ICD-10-CM | POA: Insufficient documentation

## 2014-10-09 DIAGNOSIS — Y998 Other external cause status: Secondary | ICD-10-CM | POA: Insufficient documentation

## 2014-10-09 DIAGNOSIS — S3992XA Unspecified injury of lower back, initial encounter: Secondary | ICD-10-CM | POA: Insufficient documentation

## 2014-10-09 DIAGNOSIS — M5441 Lumbago with sciatica, right side: Secondary | ICD-10-CM

## 2014-10-09 DIAGNOSIS — Z8739 Personal history of other diseases of the musculoskeletal system and connective tissue: Secondary | ICD-10-CM | POA: Insufficient documentation

## 2014-10-09 DIAGNOSIS — Z87442 Personal history of urinary calculi: Secondary | ICD-10-CM | POA: Diagnosis not present

## 2014-10-09 DIAGNOSIS — S199XXA Unspecified injury of neck, initial encounter: Secondary | ICD-10-CM | POA: Diagnosis present

## 2014-10-09 HISTORY — DX: Enthesopathy, unspecified: M77.9

## 2014-10-09 HISTORY — DX: Other cervical disc displacement, unspecified cervical region: M50.20

## 2014-10-09 MED ORDER — HYDROCODONE-ACETAMINOPHEN 5-325 MG PO TABS: 2.0000 | ORAL_TABLET | ORAL | Status: DC | PRN

## 2014-10-09 MED ORDER — CYCLOBENZAPRINE HCL 10 MG PO TABS: 10.0000 mg | ORAL_TABLET | Freq: Two times a day (BID) | ORAL | Status: DC | PRN
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
## 2016-03-08 DIAGNOSIS — G4700 Insomnia, unspecified: Secondary | ICD-10-CM | POA: Insufficient documentation

## 2016-03-08 DIAGNOSIS — M542 Cervicalgia: Secondary | ICD-10-CM | POA: Insufficient documentation

## 2016-03-08 DIAGNOSIS — G4750 Parasomnia, unspecified: Secondary | ICD-10-CM | POA: Insufficient documentation

## 2016-11-29 DIAGNOSIS — M5030 Other cervical disc degeneration, unspecified cervical region: Secondary | ICD-10-CM | POA: Insufficient documentation

## 2016-11-29 DIAGNOSIS — E162 Hypoglycemia, unspecified: Secondary | ICD-10-CM | POA: Insufficient documentation

## 2016-11-29 DIAGNOSIS — N200 Calculus of kidney: Secondary | ICD-10-CM | POA: Insufficient documentation

## 2016-11-29 DIAGNOSIS — J329 Chronic sinusitis, unspecified: Secondary | ICD-10-CM | POA: Insufficient documentation

## 2017-10-01 DIAGNOSIS — Z3169 Encounter for other general counseling and advice on procreation: Secondary | ICD-10-CM | POA: Insufficient documentation

## 2017-10-08 LAB — HM PAP SMEAR: HM Pap smear: NEGATIVE

## 2018-02-06 ENCOUNTER — Encounter (HOSPITAL_COMMUNITY): Payer: Self-pay | Admitting: Emergency Medicine

## 2018-02-06 ENCOUNTER — Emergency Department (HOSPITAL_COMMUNITY)
Admission: EM | Admit: 2018-02-06 | Discharge: 2018-02-07 | Disposition: A | Discharge status: Home / Self Care | Payer: 59 | Attending: Emergency Medicine | Admitting: Emergency Medicine

## 2018-02-06 DIAGNOSIS — Z79899 Other long term (current) drug therapy: Secondary | ICD-10-CM | POA: Diagnosis not present

## 2018-02-06 DIAGNOSIS — W540XXA Bitten by dog, initial encounter: Secondary | ICD-10-CM | POA: Diagnosis not present

## 2018-02-06 DIAGNOSIS — Z23 Encounter for immunization: Secondary | ICD-10-CM | POA: Insufficient documentation

## 2018-02-06 DIAGNOSIS — Y929 Unspecified place or not applicable: Secondary | ICD-10-CM | POA: Diagnosis not present

## 2018-02-06 DIAGNOSIS — Y939 Activity, unspecified: Secondary | ICD-10-CM | POA: Diagnosis not present

## 2018-02-06 DIAGNOSIS — T148XXA Other injury of unspecified body region, initial encounter: Secondary | ICD-10-CM

## 2018-02-06 DIAGNOSIS — S51831A Puncture wound without foreign body of right forearm, initial encounter: Secondary | ICD-10-CM | POA: Diagnosis present

## 2018-02-06 DIAGNOSIS — Y999 Unspecified external cause status: Secondary | ICD-10-CM | POA: Insufficient documentation

## 2018-02-06 NOTE — ED Triage Notes (Signed)
Patient reports her dogs were "play fighting" and arm "was in the wrong place." Two puncture wounds to right forearm. Bleeding controlled. Last tetanus >10 years ago.

## 2018-02-07 ENCOUNTER — Emergency Department (HOSPITAL_COMMUNITY): Payer: 59

## 2018-02-07 IMAGING — DX DG FOREARM 2V*R*
2 series · 2 of 2 positions shown · non-contrast
Comparison: None.

CLINICAL DATA: 36 y/o  F; dog bite of the proximal right forearm.

EXAM:
RIGHT FOREARM - 2 VIEW

[forearm ap]
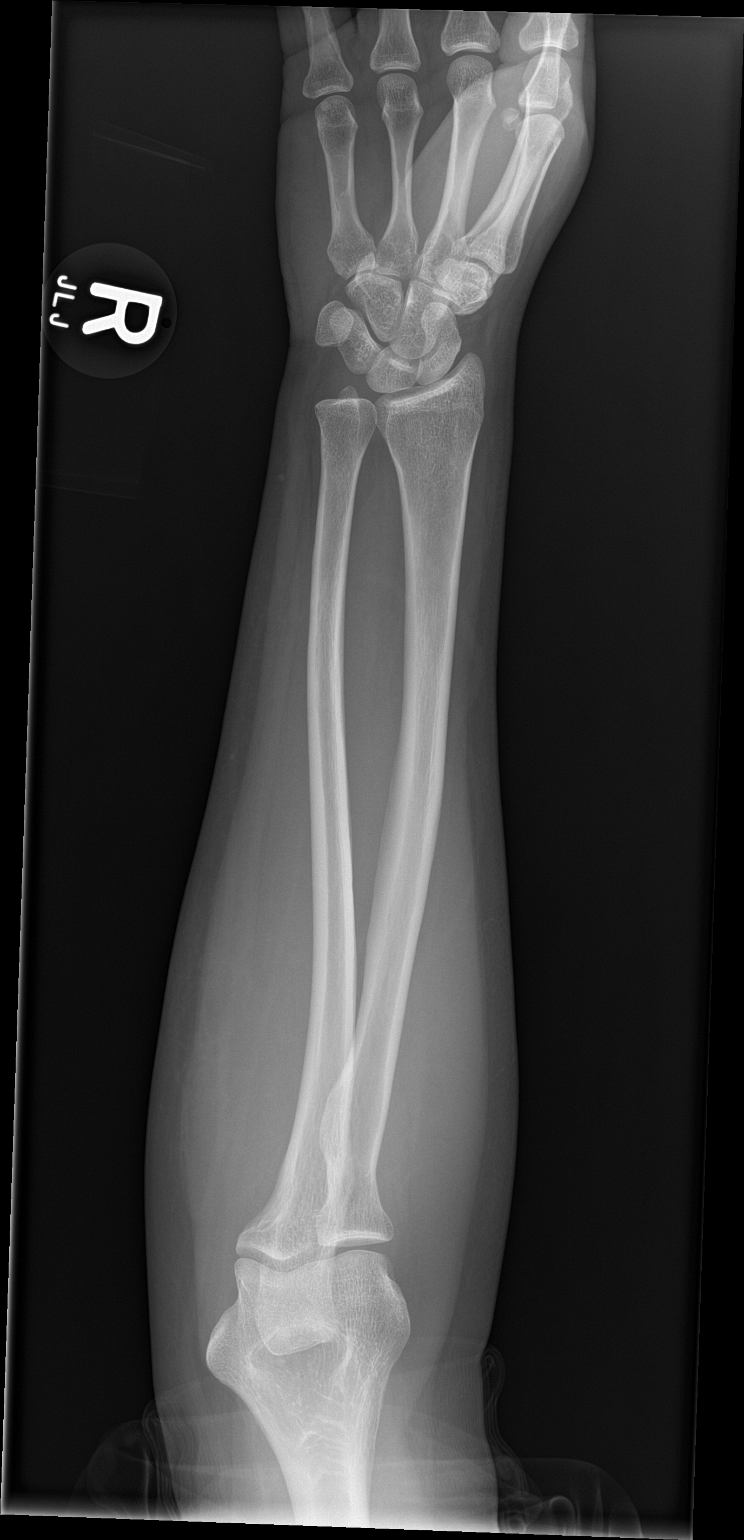

[forearm lat]
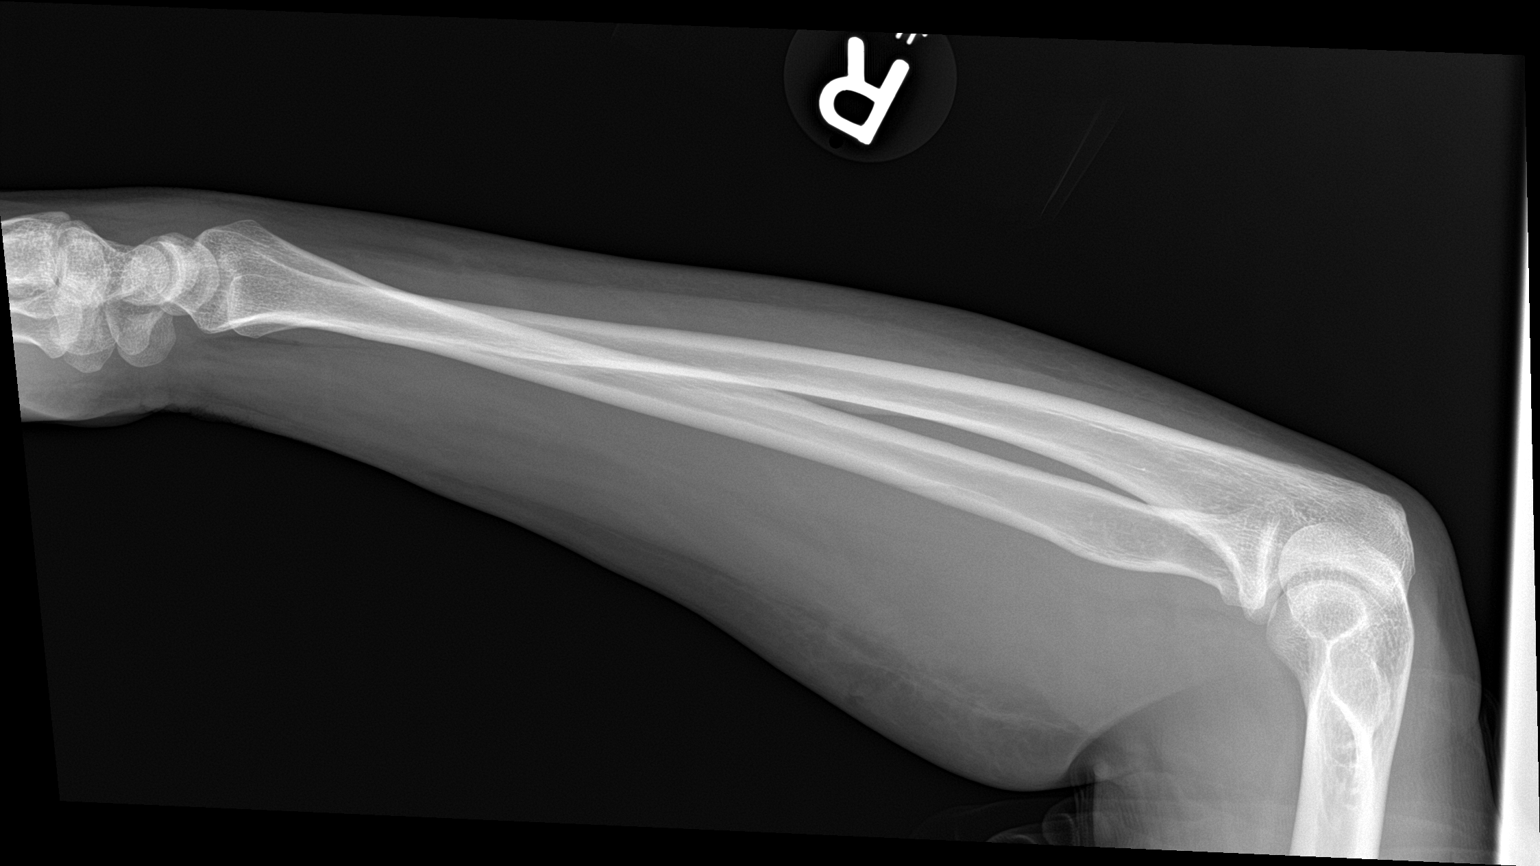

[2 of 2 positions shown; findings below may reference images not displayed]

FINDINGS: There is no evidence of fracture or other focal bone lesions. Edema
of the anterior proximal forearm soft tissues.
IMPRESSION: Edema in the anterior proximal forearm soft tissues. No acute
fracture or dislocation identified.

By: ARISSA M.D.

## 2018-02-07 MED ORDER — AMOXICILLIN-POT CLAVULANATE 875-125 MG PO TABS: 1.0000 | ORAL_TABLET | Freq: Two times a day (BID) | ORAL | 0 refills | Status: DC

## 2018-02-07 MED ORDER — TETANUS-DIPHTH-ACELL PERTUSSIS 5-2.5-18.5 LF-MCG/0.5 IM SUSP
0.5000 mL | Freq: Once | INTRAMUSCULAR | Status: AC
  Administered 2018-02-07: 0.5 mL via INTRAMUSCULAR
  Filled 2018-02-07: qty 0.5

## 2018-02-07 MED ORDER — BACITRACIN ZINC 500 UNIT/GM EX OINT
TOPICAL_OINTMENT | CUTANEOUS | Status: AC
  Filled 2018-02-07: qty 0.9

## 2018-02-07 MED ORDER — AMOXICILLIN-POT CLAVULANATE 875-125 MG PO TABS
1.0000 | ORAL_TABLET | Freq: Once | ORAL | Status: AC
  Administered 2018-02-07: 1 via ORAL
  Filled 2018-02-07: qty 1

## 2018-02-07 MED ORDER — LIDOCAINE-EPINEPHRINE (PF) 2 %-1:200000 IJ SOLN
10.0000 mL | Freq: Once | INTRAMUSCULAR | Status: AC
  Administered 2018-02-07: 10 mL
  Filled 2018-02-07: qty 20

## 2018-02-07 NOTE — ED Notes (Signed)
Pt declined discharge vital signs. She states, "I think I'm fine."

## 2018-02-07 NOTE — Discharge Instructions (Addendum)
You take antibiotics as prescribed.  Watch for worsening signs of infection.  Recheck in 2 days of the wound.  I would also recommend having the stitches out in 5-7 days.  If you develop any worsening signs of infection including streaking, drainage from the site return the ED immediately for further evaluation.  Motrin and Tylenol for pain.  May apply ice to help with the swelling.

## 2018-02-07 NOTE — ED Provider Notes (Addendum)
[facility]-EMERGENCY DEPT Provider Note   CSN: [id number] Arrival date & time: 02/06/18  2113     History   Chief Complaint Chief Complaint  Patient presents with  . Puncture Wound    HPI Jane Hendricks is a 37 y.o. female.  HPI 37 year old female with no pertinent past medical history presents to the ED for concern for dog bite with laceration.  Patient states that her dogs were playing fighting this evening and that she put her hand between them to break the fight up and her dog bit her right forearm.  Patient reports puncture wound to the right forearm with associated laceration.  Patient states that this happened less than 2 hours prior to arrival.  Patient states her last tetanus shot was greater than 10 years ago.  Does report that her dogs are up-to-date on vaccinations.  Patient denies any associated paresthesias or weakness.  Denies any other associated symptoms at this time.  Does report some pain with palpation over the area.  Patient is right-handed. Past Medical History:  Diagnosis Date  . Bone spur of other site    C5-C6  . Herniated disc, cervical   . Kidney stones     There are no active problems to display for this patient.   Past Surgical History:  Procedure Laterality Date  . ANKLE SURGERY    . DILATION AND CURETTAGE OF UTERUS    . WISDOM TOOTH EXTRACTION       OB History   None      Home Medications    Prior to Admission medications   Medication Sig Start Date End Date Taking? Authorizing Provider  amoxicillin-clavulanate (AUGMENTIN) 875-125 MG tablet Take 1 tablet by mouth every 12 (twelve) hours. 02/07/18   Rise MuLeaphart, Winnell Bento T, PA-C  cyclobenzaprine (FLEXERIL) 10 MG tablet Take 1 tablet (10 mg total) by mouth 2 (two) times daily as needed for muscle spasms. 10/09/14   Elson AreasSofia, Leslie K, PA-C  FLUoxetine (PROZAC) 20 MG capsule Take 20 mg by mouth daily. 08/11/13   [provider]  HYDROcodone-acetaminophen  (NORCO/VICODIN) 5-325 MG per tablet Take 2 tablets by mouth every 4 (four) hours as needed. 10/09/14   Elson AreasSofia, Leslie K, PA-C  lidocaine (LIDODERM) 5 % Place 1 patch onto the skin daily. Remove & Discard patch within 12 hours or as directed by MD    [provider]  ondansetron (ZOFRAN) 4 MG tablet Take 1 tablet (4 mg total) by mouth every 6 (six) hours. 08/17/13   Fayrene Helperran, Bowie, PA-C    Family History No family history on file.  Social History Social History   Tobacco Use  . Smoking status: Never Smoker  Substance Use Topics  . Alcohol use: Yes    Comment: occasional  . Drug use: No     Allergies   Patient has no known allergies.   Review of Systems Review of Systems  All other systems reviewed and are negative.    Physical Exam Updated Vital Signs BP (!) 145/102 (BP Location: Left Arm)   Pulse 93   Temp 98.9 F (37.2 C) (Oral)   Resp 18   Wt 69.8 kg (153 lb 14.4 oz)   LMP 01/06/2018   SpO2 97%   BMI 26.01 kg/m   Physical Exam  Constitutional: She appears well-developed and well-nourished. No distress.  HENT:  Head: Normocephalic and atraumatic.  Eyes: Right eye exhibits no discharge. Left eye exhibits no discharge. No scleral icterus.  Neck: Normal range of  motion.  Cardiovascular: Intact distal pulses.  Pulmonary/Chest: No respiratory distress.  Musculoskeletal: Normal range of motion.  Radial pulses are 2+ bilaterally.  Brisk cap refill.  Sensation intact in all dermatomes.  Neurological: She is alert.  Skin: Skin is warm. Capillary refill takes less than 2 seconds. No pallor.  Patient has 1 puncture wound to the right forearm with associated  2 cm laceration over the right forearm.  Adipose tissue is exposed.  The wound is gaping.  Bleeding is minimal.  Mild ecchymosis and edema noted to the area.  Psychiatric: Her behavior is normal. Judgment and thought content normal.  Nursing note and vitals reviewed.      ED Treatments / Results   Labs (all labs ordered are listed, but only abnormal results are displayed) Labs Reviewed - No data to display  EKG None  Radiology Dg Forearm Right  Result Date: 02/07/2018 CLINICAL DATA:  75 y/o  F; dog bite of the proximal right forearm. EXAM: RIGHT FOREARM - 2 VIEW COMPARISON:  None. FINDINGS: There is no evidence of fracture or other focal bone lesions. Edema of the anterior proximal forearm soft tissues. IMPRESSION: Edema in the anterior proximal forearm soft tissues. No acute fracture or dislocation identified. Electronically Signed   By: Mitzi Hansen M.D.   On: 02/07/2018 01:11    Procedures .Marland KitchenLaceration Repair Date/Time: 02/07/2018 9:50 PM Performed by: Rise Mu, PA-C Authorized by: Rise Mu, PA-C   Consent:    Consent obtained:  Verbal   Consent given by:  Patient   Risks discussed:  Infection, need for additional repair, nerve damage, poor wound healing, poor cosmetic result, pain, retained foreign body, tendon damage and vascular damage   Alternatives discussed:  No treatment and delayed treatment Anesthesia (see MAR for exact dosages):    Anesthesia method:  Local infiltration   Local anesthetic:  Lidocaine 1% WITH epi Laceration details:    Location:  Shoulder/arm   Shoulder/arm location:  R lower arm   Length (cm):  2 Repair type:    Repair type:  Simple Pre-procedure details:    Preparation:  Patient was prepped and draped in usual sterile fashion and imaging obtained to evaluate for foreign bodies Exploration:    Hemostasis achieved with:  Direct pressure and epinephrine   Wound extent: areolar tissue violated     Wound extent: no foreign bodies/material noted, no nerve damage noted and no underlying fracture noted     Contaminated: yes   Treatment:    Area cleansed with:  Betadine and saline   Amount of cleaning:  Extensive   Irrigation solution:  Sterile saline   Irrigation volume:  200   Irrigation method:  Pressure  wash   Visualized foreign bodies/material removed: no   Skin repair:    Repair method:  Sutures   Suture size:  3-0   Suture material:  Prolene   Suture technique:  Simple interrupted   Number of sutures:  2 Approximation:    Approximation:  Loose Post-procedure details:    Dressing:  Non-adherent dressing   Patient tolerance of procedure:  Tolerated well, no immediate complications   (including critical care time)  Medications Ordered in ED Medications  lidocaine-EPINEPHrine (XYLOCAINE W/EPI) 2 %-1:200000 (PF) injection 10 mL (10 mLs Infiltration Given by Other 02/07/18 0233)  amoxicillin-clavulanate (AUGMENTIN) 875-125 MG per tablet 1 tablet (1 tablet Oral Given 02/07/18 0042)  Tdap (BOOSTRIX) injection 0.5 mL (0.5 mLs Intramuscular Given 02/07/18 0042)     Initial Impression /  Assessment and Plan / ED Course  I have reviewed the triage vital signs and the nursing notes.  Pertinent labs & imaging results that were available during my care of the patient were reviewed by me and considered in my medical decision making (see chart for details).     Patient is neurovascularly intact.  Tetanus shot was updated in the ED.  She states that her dogs rabies vaccinations are up-to-date.  Patient presents to the ED with laceration and dog bite to the right forearm.  Imaging was obtained that showed some mild edema of the soft tissue of the forearm but no retained foreign bodies or subcu gas.  The wound was extensively cleaned with irrigation and sterile saline.  The laceration was gaping open approximately 2 cm.  Adipose tissue was exposed.  Discussed patient risk of closure for secondary closure.  Given that the wound is gaping with exposed adipose tissue felt that this will need to be reapproximated with loose sutures.  I did discuss that this is a risk for set up for infection.  The wound was very thoroughly irrigated.  It was cleaned extensively.  2 sutures were placed to approximate the wound  very loosely that still allowed for opening.  The puncture wound was left open.  Patient was given Augmentin will be started on Augmentin at home.  I discussed that patient needs follow-up in 2 days for recheck with primary care.  Stitches will need to be removed in 5-7 days.  I discussed return precautions sooner if symptoms worsen.  Also encourage patient to apply ice and take Motrin and Tylenol for pain.  Pt is hemodynamically stable, in NAD, & able to ambulate in the ED. Evaluation does not show pathology that would require ongoing emergent intervention or inpatient treatment. I explained the diagnosis to the patient. Pain has been managed & has no complaints prior to dc. Pt is comfortable with above plan and is stable for discharge at this time. All questions were answered prior to disposition. Strict return precautions for f/u to the ED were discussed. Encouraged follow up with PCP.   Final Clinical Impressions(s) / ED Diagnoses   Final diagnoses:  Puncture wound  Animal bite    ED Discharge Orders        Ordered    amoxicillin-clavulanate (AUGMENTIN) 875-125 MG tablet  Every 12 hours     02/07/18 0225       Rise Mu, PA-C 02/07/18 2150    Rise Mu, PA-C 02/07/18 2152    Shaune Pollack, MD 02/08/18 2217

## 2019-04-27 LAB — BASIC METABOLIC PANEL
BUN: 6 (ref 4–21)
Creatinine: 0.6 (ref 0.5–1.1)
Glucose: 73
Potassium: 4.2 (ref 3.4–5.3)
Sodium: 139 (ref 137–147)

## 2019-04-27 LAB — VITAMIN B12: Vitamin B-12: 274

## 2019-04-27 LAB — COMPREHENSIVE METABOLIC PANEL
Albumin: 4.2 (ref 3.5–5.0)
Calcium: 9 (ref 8.7–10.7)

## 2019-04-27 LAB — CBC AND DIFFERENTIAL
HCT: 36 (ref 36–46)
Hemoglobin: 12 (ref 12.0–16.0)
Platelets: 169 (ref 150–399)
WBC: 3.7

## 2019-04-27 LAB — HEPATIC FUNCTION PANEL
ALT: 74 — AB (ref 7–35)
AST: 211 — AB (ref 13–35)
Alkaline Phosphatase: 74 (ref 25–125)
Bilirubin, Total: 0.6

## 2019-04-27 LAB — CBC: RBC: 3.23 — AB (ref 3.87–5.11)

## 2019-04-27 LAB — TSH: TSH: 1.74 (ref <=5.90)

## 2019-04-27 LAB — VITAMIN D 25 HYDROXY (VIT D DEFICIENCY, FRACTURES): Vit D, 25-Hydroxy: 26

## 2019-04-28 DIAGNOSIS — N926 Irregular menstruation, unspecified: Secondary | ICD-10-CM | POA: Insufficient documentation

## 2019-04-28 DIAGNOSIS — G629 Polyneuropathy, unspecified: Secondary | ICD-10-CM | POA: Insufficient documentation

## 2019-12-12 DIAGNOSIS — S129XXA Fracture of neck, unspecified, initial encounter: Secondary | ICD-10-CM

## 2019-12-12 HISTORY — DX: Fracture of neck, unspecified, initial encounter: S12.9XXA

## 2019-12-13 ENCOUNTER — Emergency Department (HOSPITAL_COMMUNITY): Payer: 59

## 2019-12-13 ENCOUNTER — Emergency Department (HOSPITAL_COMMUNITY)
Admission: EM | Admit: 2019-12-13 | Discharge: 2019-12-13 | Disposition: A | Discharge status: Home / Self Care | Payer: 59 | Attending: Emergency Medicine | Admitting: Emergency Medicine

## 2019-12-13 ENCOUNTER — Other Ambulatory Visit: Payer: Self-pay

## 2019-12-13 ENCOUNTER — Encounter (HOSPITAL_COMMUNITY): Payer: Self-pay | Admitting: Emergency Medicine

## 2019-12-13 DIAGNOSIS — E041 Nontoxic single thyroid nodule: Secondary | ICD-10-CM | POA: Diagnosis not present

## 2019-12-13 DIAGNOSIS — Y939 Activity, unspecified: Secondary | ICD-10-CM | POA: Insufficient documentation

## 2019-12-13 DIAGNOSIS — S060X1A Concussion with loss of consciousness of 30 minutes or less, initial encounter: Secondary | ICD-10-CM

## 2019-12-13 DIAGNOSIS — Y999 Unspecified external cause status: Secondary | ICD-10-CM | POA: Diagnosis not present

## 2019-12-13 DIAGNOSIS — Y929 Unspecified place or not applicable: Secondary | ICD-10-CM | POA: Diagnosis not present

## 2019-12-13 DIAGNOSIS — R519 Headache, unspecified: Secondary | ICD-10-CM | POA: Diagnosis present

## 2019-12-13 DIAGNOSIS — Z79899 Other long term (current) drug therapy: Secondary | ICD-10-CM | POA: Diagnosis not present
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
## 2019-12-13 MED ORDER — ACETAMINOPHEN 500 MG PO TABS
1000.0000 mg | ORAL_TABLET | Freq: Once | ORAL | Status: AC
  Administered 2019-12-13: 1000 mg via ORAL
  Filled 2019-12-13: qty 2

## 2019-12-13 NOTE — ED Notes (Signed)
Pt was discharged from the ED. Pt read and understood discharge paperwork. Pt had vital signs completed. E-signature not available. Pt conscious, breathing, and A&Ox4. No distress noted. Pt speaking in complete sentences. Pt ambulated out of the ED with a smooth and steady gait.  
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
## 2019-12-13 NOTE — Discharge Instructions (Addendum)
Please contact either the neurologist or the doctor listed to be seen in follow-up for concussion.  Your CT scan had an incidental finding including a thyroid nodule.  Please let your primary care doctor know about this.  He will likely need a nonemergent ultrasound of your thyroid for further evaluation.  Please take Tylenol or ibuprofen for pain.  Please get plenty of rest.  Avoid screen time.
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
## 2019-12-14 ENCOUNTER — Telehealth: Payer: Self-pay

## 2019-12-14 NOTE — Telephone Encounter (Signed)
Patient scheduled tomorrow.
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
## 2019-12-15 ENCOUNTER — Other Ambulatory Visit: Payer: Self-pay

## 2019-12-15 ENCOUNTER — Ambulatory Visit: Payer: 59 | Admitting: Family Medicine

## 2019-12-15 ENCOUNTER — Encounter: Payer: Self-pay | Admitting: Family Medicine

## 2019-12-15 ENCOUNTER — Telehealth: Payer: Self-pay

## 2019-12-15 VITALS — BP 112/88 | HR 85 | Ht 64.5 in | Wt 129.6 lb

## 2019-12-15 DIAGNOSIS — S060X9A Concussion with loss of consciousness of unspecified duration, initial encounter: Secondary | ICD-10-CM

## 2019-12-15 DIAGNOSIS — G3281 Cerebellar ataxia in diseases classified elsewhere: Secondary | ICD-10-CM | POA: Diagnosis not present

## 2019-12-15 DIAGNOSIS — R258 Other abnormal involuntary movements: Secondary | ICD-10-CM | POA: Diagnosis not present

## 2019-12-15 DIAGNOSIS — R270 Ataxia, unspecified: Secondary | ICD-10-CM

## 2019-12-15 MED ORDER — AMBULATORY NON FORMULARY MEDICATION: 0 refills | Status: AC

## 2019-12-15 MED ORDER — HYDROCODONE-ACETAMINOPHEN 5-325 MG PO TABS: 1.0000 | ORAL_TABLET | Freq: Four times a day (QID) | ORAL | 0 refills | Status: DC | PRN

## 2019-12-15 MED ORDER — PREDNISONE 50 MG PO TABS
50.0000 mg | ORAL_TABLET | Freq: Every day | ORAL | 0 refills | Status: DC
Start: 2019-12-15 — End: 2020-01-03

## 2019-12-15 MED ORDER — CLONAZEPAM 0.5 MG PO TABS: 0.5000 mg | ORAL_TABLET | Freq: Two times a day (BID) | ORAL | 1 refills | Status: DC | PRN

## 2019-12-15 NOTE — Patient Instructions (Addendum)
Thank you for coming in today. Take the prednisone daily for 5 days.  Take klonopin as needed for anxiety or tremor.  Use hydrocodone for severe pain Do not take with klonopin.  Plan for MRI soon.  Recheck with me early next week following MRI.  Return sooner if needed.   Get a walker from [facility].    Ataxia  Ataxia is a condition that causes unsteadiness when walking and standing, poor coordination of body movements, and difficulty keeping a straight (upright) posture. It occurs because of a problem with the part of the brain that controls coordination and stability (cerebellum). Ataxia can develop later in life (acquired ataxia), during your 20s or 30s or even into your 60s or later. This type of ataxia develops when another medical condition, such as a stroke, damages the cerebellum. Ataxia also may be present early in life (non-acquired ataxia). There are two main types of non-acquired ataxia:  Congenital. This type is present at birth.  Hereditary. This type is passed from parent to child. The most common form of hereditary non-acquired ataxia is Friedreich ataxia. What are the causes? Acquired ataxia may be caused by:  Changes in the nervous system (neurodegenerative changes).  Changes throughout the body (systemic disorders).  A lot of exposure to: ? Certain medicines such as phenytoin and lithium. ? Solvents. These are cleaning fluids such as paint thinner, nail polish remover, carpet cleaner, and degreasers.  Alcohol abuse (alcoholism).  Medical conditions, such as: ? Celiac disease. ? Hypothyroidism. ? A lack (deficiency) of vitamin E, vitamin B12, or thiamine. ? Brain tumors. ? Multiple sclerosis. ? Cerebral palsy. ? Stroke. ? Paraneoplastic syndromes. ? Viral infections. ? Head injury. ? Malnutrition. Congenital and hereditary ataxia are caused by problems that are present in genes before birth. What are the signs or symptoms? Signs and symptoms  of ataxia vary depending on the cause. They may include:  Being unsteady.  Walking with the legs wide apart (wide stance) to keep one's balance.  Uncontrolled shaking (tremor).  Poorly coordinated body movements.  Difficulty maintaining an upright posture.  Fatigue.  Changes in speech.  Changes in vision.  Involuntary eye movements (nystagmus).  Difficulty swallowing.  Difficulty writing.  Muscle tightening that you cannot control (muscle spasms). How is this diagnosed? Ataxia may be diagnosed based on:  Your personal and family medical history.  A physical exam.  Imaging tests, such as a CT scan or MRI.  Spinal tap (lumbar puncture). This procedure involves using a needle to take a sample of the fluid around your brain and spinal cord.  Genetic testing. How is this treated? The underlying condition that causes your ataxia needs to be treated. If the cause is a brain tumor, you may need surgery. Treatment also focuses on helping you live with ataxia and improving your quality of life (supportive treatments). This may involve:  Learning ways to improve coordination and move around more carefully (physical therapy).  Learning ways to improve your ability to do daily tasks, such as bathing and feeding yourself (occupational therapy).  Using devices to help you move around, eat, or communicate (assistive devices), such as a walker, modified eating utensils, and communication aids.  Learning ways to improve speech and swallowing (speech therapy). Follow these instructions at home: Preventing falls  Lie down right away if you become very unsteady, dizzy, or nauseous, or if you feel like you are going to faint. Do not get up until all of those feelings pass.  Keep  your home well-lit. Use night-lights as needed.  Remove tripping hazards, such as rugs, cords, and clutter.  Install grab bars by the toilet and in the tub and shower.  Use assistive devices such as a  cane, walker, or wheelchair as needed to keep your balance. General instructions  Do not drink alcohol.  Ask your health care provider what activities are safe for you, and what activities you should avoid.  Take over-the-counter and prescription medicines only as told by your health care provider. Get help right away if you:  Have unsteadiness that suddenly worsens.  Have any of these: ? Severe headaches. ? Chest pain. ? Abdominal pain. ? Weakness or numbness on one side of your body. ? Vision problems. ? Difficulty speaking. ? An irregular heartbeat. ? A very fast pulse.  Feel confused. Summary  Ataxia is a condition that causes unsteadiness when walking and standing, poor coordination of body movements, and difficulty keeping a straight (upright) posture.  Ataxia occurs because of a problem with the part of the brain that controls coordination and stability (cerebellum).  The underlying condition that causes your ataxia needs to be treated. Treatment also focuses on helping you live with ataxia and improving your quality of life (supportive treatments).  Lie down right away if you become very unsteady, dizzy, or nauseous, or if you feel like you are going to faint. This information is not intended to replace advice given to you by your health care provider. Make sure you discuss any questions you have with your health care provider. Document Revised: 10/03/2017 Document Reviewed: 08/22/2017 Elsevier Patient Education  2020 [employer].

## 2019-12-15 NOTE — Telephone Encounter (Signed)
Patients mother called stating that patients MRI is scheduled for this Saturday at [facility] and are wondering if you were wanting this done earlier than that or is this the earliest that they could get. Would like a call back
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
## 2019-12-16 ENCOUNTER — Telehealth: Payer: Self-pay | Admitting: Family Medicine

## 2019-12-16 MED ORDER — ONDANSETRON 4 MG PO TBDP: 4.0000 mg | ORAL_TABLET | Freq: Three times a day (TID) | ORAL | 0 refills | Status: DC | PRN

## 2019-12-16 NOTE — Telephone Encounter (Signed)
Returned mother Anne's call and informed her that the Saturday MRI in [place] is the earliest that [facility] will be able to get her MRI.  Mother shares additional information as follows: 1) States that after [facility] left our office yesterday that her gait improved for a period of time and then she watched TV for approximately 90 min.  Mom says that Jamyrah seemed to "perk up" while watching TV but then noticed that her gait was worse once she stood up from watching TV and walked to her bedroom.  Thurston Hole is wondering if watching TV was too much.  We discuss that perhaps watching for 90 min straight combined w/ all the mental activity she had earlier during her appt might have all been too much so suggestion would be to decrease TV watching into smaller doses perhaps 30 min at a time. 2) Wanted to verify the prednisone dose of 50 mg qd for 5 days was correct and that she didn't need any type of taper?  I verified what was prescribed in Epic.  Please advise if different. 3) Mom wanting to know if there is anything specific movement-wise that Marjean Donna shouldn't do and I advised her to use pain and symptomatology as a guide so if a certain movement or activity is increasing pain or increasing symptoms, then stop and avoid that activity. 4) Mom asked how much of a separation is needed between hydrocodone and klonipin and Dr. Denyse Amass advises a few hours. 5) Would Dr. Denyse Amass be able to prescribe something for nausea to [facility] pharmacy.  Please advise as needed.

## 2019-12-16 NOTE — Telephone Encounter (Signed)
Pt called and requested anti-nausea medicine sent to [facility].

## 2019-12-16 NOTE — Telephone Encounter (Signed)
Agree with plan.  Prescribed zofran.  Basics. Just try to take it easy and rest.

## 2019-12-18 ENCOUNTER — Ambulatory Visit (INDEPENDENT_AMBULATORY_CARE_PROVIDER_SITE_OTHER): Payer: 59

## 2019-12-18 ENCOUNTER — Other Ambulatory Visit: Payer: Self-pay

## 2019-12-18 DIAGNOSIS — R270 Ataxia, unspecified: Secondary | ICD-10-CM

## 2019-12-18 DIAGNOSIS — S060X9A Concussion with loss of consciousness of unspecified duration, initial encounter: Secondary | ICD-10-CM

## 2019-12-18 DIAGNOSIS — M47892 Other spondylosis, cervical region: Secondary | ICD-10-CM | POA: Diagnosis not present

## 2019-12-18 DIAGNOSIS — M4802 Spinal stenosis, cervical region: Secondary | ICD-10-CM | POA: Diagnosis not present

## 2019-12-18 DIAGNOSIS — G3281 Cerebellar ataxia in diseases classified elsewhere: Secondary | ICD-10-CM | POA: Diagnosis not present
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
## 2019-12-20 ENCOUNTER — Encounter: Payer: Self-pay | Admitting: Family Medicine

## 2019-12-20 ENCOUNTER — Ambulatory Visit: Payer: 59 | Admitting: Family Medicine

## 2019-12-20 ENCOUNTER — Other Ambulatory Visit: Payer: Self-pay

## 2019-12-20 VITALS — BP 130/90 | HR 83 | Ht 64.5 in | Wt 136.4 lb

## 2019-12-20 DIAGNOSIS — G3281 Cerebellar ataxia in diseases classified elsewhere: Secondary | ICD-10-CM | POA: Diagnosis not present

## 2019-12-20 DIAGNOSIS — S060X9A Concussion with loss of consciousness of unspecified duration, initial encounter: Secondary | ICD-10-CM

## 2019-12-20 MED ORDER — HYDROCODONE-ACETAMINOPHEN 5-325 MG PO TABS: 1.0000 | ORAL_TABLET | Freq: Four times a day (QID) | ORAL | 0 refills | Status: DC | PRN

## 2019-12-20 MED ORDER — TRAZODONE HCL 50 MG PO TABS: 50.0000 mg | ORAL_TABLET | Freq: Every day | ORAL | 1 refills | Status: DC

## 2019-12-20 NOTE — Progress Notes (Signed)
MRI cervical spine does not show cord compression or spinal cord injury.  There are some mild nerves being pinched possibly affecting the upper extremities but not severe spinal cord injury that I was concerned about.

## 2019-12-20 NOTE — Progress Notes (Signed)
MRI brain does not show any severe changes.  We will discuss in more detail at follow-up.
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
## 2019-12-20 NOTE — Patient Instructions (Addendum)
Thank you for coming in today. Schedule PT for neurorehab and balance.  Recheck with me in 2 weeks.  Let me know if you have any problems.  Use the walker for safety.  Use traozodone for insomnia.

## 2019-12-22 ENCOUNTER — Ambulatory Visit: Payer: 59 | Attending: Family Medicine | Admitting: Physical Therapy

## 2019-12-22 ENCOUNTER — Other Ambulatory Visit: Payer: Self-pay

## 2019-12-22 DIAGNOSIS — R42 Dizziness and giddiness: Secondary | ICD-10-CM | POA: Diagnosis present

## 2019-12-22 DIAGNOSIS — R2689 Other abnormalities of gait and mobility: Secondary | ICD-10-CM | POA: Diagnosis present

## 2019-12-22 DIAGNOSIS — M6281 Muscle weakness (generalized): Secondary | ICD-10-CM

## 2019-12-22 DIAGNOSIS — R260 Ataxic gait: Secondary | ICD-10-CM

## 2019-12-22 DIAGNOSIS — R2681 Unsteadiness on feet: Secondary | ICD-10-CM

## 2019-12-22 DIAGNOSIS — R29818 Other symptoms and signs involving the nervous system: Secondary | ICD-10-CM | POA: Diagnosis present
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
## 2019-12-27 ENCOUNTER — Encounter: Payer: Self-pay | Admitting: Physical Therapy

## 2019-12-27 ENCOUNTER — Other Ambulatory Visit: Payer: Self-pay

## 2019-12-27 ENCOUNTER — Ambulatory Visit: Payer: 59 | Admitting: Physical Therapy

## 2019-12-27 DIAGNOSIS — R42 Dizziness and giddiness: Secondary | ICD-10-CM

## 2019-12-27 DIAGNOSIS — R2689 Other abnormalities of gait and mobility: Secondary | ICD-10-CM

## 2019-12-27 DIAGNOSIS — M6281 Muscle weakness (generalized): Secondary | ICD-10-CM

## 2019-12-27 DIAGNOSIS — R260 Ataxic gait: Secondary | ICD-10-CM

## 2019-12-27 DIAGNOSIS — R2681 Unsteadiness on feet: Secondary | ICD-10-CM

## 2019-12-27 DIAGNOSIS — R29818 Other symptoms and signs involving the nervous system: Secondary | ICD-10-CM

## 2019-12-27 NOTE — Therapy (Signed)
[facility] [address] [place], [place], [address] Phone: [phone]   Fax:  [phone]  Physical Therapy Treatment  Patient Details  Name: Jane Hendricks MRN: [id number] Date of Birth: [birth year] Referring Provider (PT): Clementeen Graham, MD   Encounter Date: 12/27/2019  PT End of Session - 12/27/19 2054    Visit Number  2    Number of Visits  33    Date for PT Re-Evaluation  03/21/20    Authorization Type  [employer] - $40 copay - VL: 60 (PT/OT/ST combined)    Authorization - Visit Number  2    Authorization - Number of Visits  60    PT Start Time  1629    PT Stop Time  1715    PT Time Calculation (min)  46 min    Activity Tolerance  Patient tolerated treatment well    Behavior During Therapy  [facility] for tasks assessed/performed       Past Medical History:  Diagnosis Date  . Bone spur of other site    C5-C6  . Herniated disc, cervical   . Kidney stones     Past Surgical History:  Procedure Laterality Date  . ANKLE SURGERY    . DILATION AND CURETTAGE OF UTERUS    . WISDOM TOOTH EXTRACTION      There were no vitals filed for this visit.  Subjective Assessment - 12/27/19 1635    Subjective  Dizziness settled down after eval while riding in the car and has not had another episode since.  Worked this morning and had a headache but no dizziness.  Some stiffness in neck today.    Patient is accompained by:  Family member   both parents   Pertinent History  PMH: hx of herniated disc in cervical spine    Diagnostic tests  MRI 2/13 cervical spine: No marrow edema to suggest acute cervical spine fracture. Cervical spondylosis as outlined. No more than mild spinal canalstenosis at any level. Multilevel neural foraminal narrowinggreatest on the left at C4-C5 and bilaterally at C5-C6(moderate/advanced at these sites).MRI brain: Normal MRI appearance of the brain for age. No evidence of acuteintracranial abnormality     Patient Stated Goals  be able to walk and drive again to be more independent    Currently in Pain?  Yes    Pain Score  5     Pain Location  Head             Vestibular Assessment - 12/27/19 1638      Symptom Behavior   Subjective history of current problem  denies changes in hearing; right after accident pt had audiological hallucinations, denies changes in vision.  Does have photophobia.      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      Oculomotor Exam-Fixation Suppressed    Left Head Impulse  negative    Right Head Impulse  positive  Vestibulo-Ocular Reflex   VOR to Slow Head Movement  Normal    VOR Cancellation  Normal      Positional Testing   Dix-Hallpike  Dix-Hallpike Right;Dix-Hallpike Left    Horizontal Canal Testing  Horizontal Canal Right;Horizontal Canal Left      Dix-Hallpike Right   Dix-Hallpike Right Duration  0    Dix-Hallpike Right Symptoms  No nystagmus      Dix-Hallpike Left   Dix-Hallpike Left Duration  0    Dix-Hallpike Left Symptoms  No nystagmus      Horizontal Canal Right   Horizontal Canal Right Duration  0    Horizontal Canal Right Symptoms  Normal      Horizontal Canal Left   Horizontal Canal Left Duration  0    Horizontal Canal Left Symptoms  Normal      Balancemaster   Balancemaster Comment  MCTSIB: 30 seconds condition 1 with increased use of ankle strategy; conditions 2,3,4 - 3-4 seconds with min-mod A to maintain balance - pt falls posteriorly      Positional Sensitivities   Sit to Supine  No dizziness    Supine to Left  Side  Lightheadedness    Supine to Right Side  Lightheadedness    Supine to Sitting  Lightheadedness    Right Hallpike  No dizziness    Up from Right Hallpike  No dizziness    Up from Left Hallpike  No dizziness    Nose to Right Knee  No dizziness    Right Knee to Sitting  Lightedness    Nose to Left Knee  No dizziness    Left Knee to Sitting  Lightheadedness    Head Turning x 5  Mild dizziness    Head Nodding x 5  Mild dizziness    Pivot Right in Standing  Mild dizziness    Pivot Left in Standing  Mild dizziness    Rolling Right  Lightheadedness    Rolling Left  Lightheadedness                       PT Education - 12/27/19 2053    Education Details  vestibular findings, plan for next session    Person(s) Educated  Patient;Parent(s)    Methods  Explanation    Comprehension  Verbalized understanding       PT Short Term Goals - 12/27/19 2104      PT SHORT TERM GOAL #1   Title  Pt will be independent with initial HEP in order to build upon functional gains made in therapy. ALL STGS DUE 02/03/20    Time  6   due to delay in scheduling   Period  Weeks    Status  New    Target Date  02/03/20      PT SHORT TERM GOAL #2   Title  Pt will undergo further vestibular assessment - goals to be written as appropriate.    Time  6    Period  Weeks    Status  Achieved      PT SHORT TERM GOAL #3   Title  Pt will undergo assessment of TUG to determine fall risk  - goal to be written as appropriate.    Time  6    Period  Weeks    Status  New      PT SHORT TERM GOAL #4   Title  Pt will improve gait speed with RW to at least 2.3 ft/sec in order  to decr fall risk and improve community mobility.    Baseline  1.78 ft/sec with RW.    Time  6    Period  Weeks    Status  New      PT SHORT TERM GOAL #5   Title  Pt will improve B cervical rotation AROM to at least 40 degrees with mild pain in order to improve functional mobility.    Time  6    Period  Weeks    Status  New         PT Long Term Goals - 12/27/19 2105      PT LONG TERM GOAL #1   Title  Pt will be independent with final HEP in order to build upon functional gains made in therapy. ALL LTGS DUE 03/21/20    Time  12    Period  Weeks    Status  New      PT LONG TERM GOAL #2   Title  Pt will ambulate at least 300' with LRAD with supervision over level and unlevel surfaces in order to improve community mobility.    Time  12    Period  Weeks    Status  New      PT LONG TERM GOAL #3   Title  Pt will improve BERG score to at least a 30/56 in order to determine decr fall risk.    Baseline  14/56 on 12/23/19    Time  12    Period  Weeks    Status  New      PT LONG TERM GOAL #4   Title  Pt will improve B cervical rotation AROM to 65 degrees for functional ROM for driving.    Baseline  R rotation 43 degrees, L rotation 25 degrees    Time  12    Period  Weeks    Status  New      PT LONG TERM GOAL #5   Title  TUG goal to be written as appropriate to determine fall risk.    Time  12    Period  Weeks    Status  New      PT LONG TERM GOAL #6   Title  Pt will improve gait speed with LRAD to at least 2.7 ft/sec in order to decr fall risk and improve community mobility.    Time  12    Period  Weeks    Status  New      PT LONG TERM GOAL #7   Title  Pt will demonstrate decreased motion sensitivity as indicated by ability to perform bending down to the floor, performing head turns/nods and body turns without any dizziness; pt will also improve use of vestibular system as indicated by ability to maintain 30 seconds on each condition of the MCTSIB.    Baseline  lightheaded-mild dizziness on MSQ; 4 seconds on conditions 2,3,4; 30 seconds on condition 1 but with increased use of ankle strategy    Time  12    Period  Weeks    Status  New    Target Date  03/21/20            Plan - 12/27/19 2059    Clinical Impression Statement  Treatment session today focused on assessment of vestibular system  following concussion and following episode of severe dizziness last session.  Pt did not present with BPPV.  Pt did demonstrate slightly delayed VOR when tested with HIT, motion sensitivity, disequilibrium and  decreased integration of visual, somatosensory and vestibular systems for balance.  Pt unable to maintain her balance on MCTSIB when eyes were closed and when on compliant surface.  Will initiate vestibular and balance HEP next session.    Personal Factors and Comorbidities  Past/Current Experience;Profession    Examination-Activity Limitations  Bend;Carry;Dressing;Locomotion Level;Squat;Stairs;Lift;Stand;Transfers;Hygiene/Grooming;Bathing    Examination-Participation Restrictions  Cleaning;Community Activity;Driving;Shop;Laundry    Stability/Clinical Decision Making  Evolving/Moderate complexity    Rehab Potential  Good    PT Frequency  Other (comment)   3x/week x 8, 2x/week x 4   PT Duration  12 weeks    PT Treatment/Interventions  ADLs/Self Care Home Management;Canalith Repostioning;DME Instruction;Gait training;Stair training;Functional mobility training;Neuromuscular re-education;Balance training;Therapeutic exercise;Therapeutic activities;Patient/family education;Dry needling;Passive range of motion;Energy conservation;Vestibular;Visual/perceptual remediation/compensation    PT Next Visit Plan  Initiate vestibular HEP; perform TUG for fall risk, gentle stretches/ROM for cervical spine for HEP; balance on compliant surfaces, vision removed.  Gait with decreased UE support    Consulted and Agree with Plan of Care  Patient;Family member/caregiver       Patient will benefit from skilled therapeutic intervention in order to improve the following deficits and impairments:  Abnormal gait, Decreased activity tolerance, Decreased balance, Decreased coordination, Difficulty walking, Decreased strength, Dizziness, Hypomobility, Increased muscle spasms, Pain, Decreased mobility  Visit  Diagnosis: Unsteadiness on feet  Ataxic gait  Other abnormalities of gait and mobility  Muscle weakness (generalized)  Other symptoms and signs involving the nervous system  Dizziness and giddiness     Problem List There are no problems to display for this patient.   Rico Junker, PT, DPT 12/27/19    9:13 PM    [facility] [address] [place], [place], [address] Phone: [phone]   Fax:  [phone]  Name: Jane Hendricks MRN: [id number] Date of Birth: [birth year]

## 2019-12-30 ENCOUNTER — Encounter: Payer: Self-pay | Admitting: Physical Therapy

## 2019-12-30 ENCOUNTER — Other Ambulatory Visit: Payer: Self-pay

## 2019-12-30 ENCOUNTER — Ambulatory Visit: Payer: 59 | Admitting: Physical Therapy

## 2019-12-30 DIAGNOSIS — R29818 Other symptoms and signs involving the nervous system: Secondary | ICD-10-CM

## 2019-12-30 DIAGNOSIS — R2681 Unsteadiness on feet: Secondary | ICD-10-CM

## 2019-12-30 DIAGNOSIS — M6281 Muscle weakness (generalized): Secondary | ICD-10-CM

## 2019-12-30 DIAGNOSIS — R42 Dizziness and giddiness: Secondary | ICD-10-CM

## 2019-12-30 DIAGNOSIS — R260 Ataxic gait: Secondary | ICD-10-CM

## 2019-12-30 DIAGNOSIS — R2689 Other abnormalities of gait and mobility: Secondary | ICD-10-CM

## 2019-12-30 NOTE — Therapy (Signed)
[facility] [address] [place], [place], [address] Phone: [phone]   Fax:  [phone]  Physical Therapy Treatment  Patient Details  Name: Jane Hendricks MRN: [id number] Date of Birth: 07-Aug-1981 Referring Provider (PT): Clementeen Graham, MD   Encounter Date: 12/30/2019  PT End of Session - 12/30/19 1257    Visit Number  3    Number of Visits  33    Date for PT Re-Evaluation  03/21/20    Authorization Type  [employer] - $40 copay - VL: 60 (PT/OT/ST combined)    Authorization - Visit Number  3    Authorization - Number of Visits  60    PT Start Time  0845    PT Stop Time  0935    PT Time Calculation (min)  50 min    Activity Tolerance  Patient tolerated treatment well    Behavior During Therapy  [facility] for tasks assessed/performed       Past Medical History:  Diagnosis Date  . Bone spur of other site    C5-C6  . Herniated disc, cervical   . Kidney stones     Past Surgical History:  Procedure Laterality Date  . ANKLE SURGERY    . DILATION AND CURETTAGE OF UTERUS    . WISDOM TOOTH EXTRACTION      There were no vitals filed for this visit.  Subjective Assessment - 12/30/19 0851    Subjective  No issues after last session, no significant episodes of dizziness.  Work has been okay - has help from a student, computer work has been okay.  Feels exhausted after 7 hours of work.  Had questions about goals and time frame.  Woke up this morning with severe neck pain - had to take tylenol.    Patient is accompained by:  Family member   both parents   Pertinent History  PMH: hx of herniated disc in cervical spine    Diagnostic tests  MRI 2/13 cervical spine: No marrow edema to suggest acute cervical spine fracture. Cervical spondylosis as outlined. No more than mild spinal canalstenosis at any level. Multilevel neural foraminal narrowinggreatest on the left at C4-C5 and bilaterally at C5-C6(moderate/advanced at these  sites).MRI brain: Normal MRI appearance of the brain for age. No evidence of acuteintracranial abnormality    Patient Stated Goals  be able to walk and drive again to be more independent; to play golf and cornhole again    Currently in Pain?  No/denies         [facility] PT Assessment - 12/30/19 0858      Standardized Balance Assessment   Standardized Balance Assessment  Timed Up and Go Test      Timed Up and Go Test   TUG  Normal TUG    Normal TUG (seconds)  20.22    TUG Comments  with RW                    Vestibular Treatment/Exercise - 12/30/19 1253      Vestibular Treatment/Exercise   Vestibular Treatment Provided  Gaze    Gaze Exercises  X1 Viewing Horizontal;X1 Viewing Vertical      X1 Viewing Horizontal   Foot Position  seated     Reps  2    Comments  able to tolerate 30 seconds with mild symptoms      X1 Viewing Vertical   Foot Position  seated    Reps  2    Comments  unable to tolerate 30 seconds - resulted in moderate dizziness and HA; able to tolerate 10 seconds         Balance Exercises - 12/30/19 1253      Balance Exercises: Standing   Standing Eyes Opened  Wide (BOA);Head turns;Solid surface;Other reps (comment)   10 reps, one UE support, increased sway   Sit to Stand  Standard surface;Upper extremity support;Other (comment)    Other Standing Exercises  For sit > stand started with pt placing one hand on RW handle but pt demonstrated increased use of leaning, shoulder elevation and trunk rotation to stand.  Changed to placing one hand forwards on front of walker, keeping UE straight to facilitate increased anterior lean over BOS and cues to push feet into ground to stand.  During sit <> stand pt cued to focus eyes on stable target - top target when performing sit > stand and lower target during stand > sit.  Performed x 5 reps with min A.         Access Code: [email or web address] URL: [email or web address] Date: 12/30/2019 Prepared by:  Bufford Lope  Exercises Seated Gaze Stabilization with Head Rotation - 2 sets - 30 seconds hold - 1x daily - 7x weekly Seated Gaze Stabilization with Head Nod - 2 sets - 10 seconds hold - 1x daily - 7x weekly Sit to Stand with Arm Reach Toward Target - 5 reps - 2 sets - 1x daily - 7x weekly Standing Balance in Corner - 10 reps - 2 sets - 1x daily - 7x weekly   PT Education - 12/30/19 1256    Education Details  falls risk assessment, initiated vestibular/balance HEP    Person(s) Educated  Patient;Spouse    Methods  Explanation;Demonstration;Handout    Comprehension  Verbalized understanding;Returned demonstration       PT Short Term Goals - 12/30/19 1257      PT SHORT TERM GOAL #1   Title  Pt will be independent with initial HEP in order to build upon functional gains made in therapy. ALL STGS DUE 02/03/20    Time  6   due to delay in scheduling   Period  Weeks    Status  New    Target Date  02/03/20      PT SHORT TERM GOAL #2   Title  Pt will undergo further vestibular assessment - goals to be written as appropriate.    Time  6    Period  Weeks    Status  Achieved      PT SHORT TERM GOAL #3   Title  Pt will undergo assessment of TUG to determine fall risk  - goal to be written as appropriate.    Time  6    Period  Weeks    Status  Achieved      PT SHORT TERM GOAL #4   Title  Pt will improve gait speed with RW to at least 2.3 ft/sec in order to decr fall risk and improve community mobility.    Baseline  1.78 ft/sec with RW.    Time  6    Period  Weeks    Status  New      PT SHORT TERM GOAL #5   Title  Pt will improve B cervical rotation AROM to at least 40 degrees with mild pain in order to improve functional mobility.    Time  6    Period  Weeks    Status  New  PT Long Term Goals - 12/30/19 1258      PT LONG TERM GOAL #1   Title  Pt will be independent with final HEP in order to build upon functional gains made in therapy. ALL LTGS DUE 03/21/20    Time   12    Period  Weeks    Status  New      PT LONG TERM GOAL #2   Title  Pt will ambulate at least 300' with LRAD with supervision over level and unlevel surfaces in order to improve community mobility.    Time  12    Period  Weeks    Status  New      PT LONG TERM GOAL #3   Title  Pt will improve BERG score to at least a 30/56 in order to determine decr fall risk.    Baseline  14/56 on 12/23/19    Time  12    Period  Weeks    Status  New      PT LONG TERM GOAL #4   Title  Pt will improve B cervical rotation AROM to 65 degrees for functional ROM for driving.    Baseline  R rotation 43 degrees, L rotation 25 degrees    Time  12    Period  Weeks    Status  New      PT LONG TERM GOAL #5   Title  Pt will decrease TUG time to </= 13.5 seconds without use of AD    Baseline  20.22 seconds with RW    Time  12    Period  Weeks    Status  Revised      PT LONG TERM GOAL #6   Title  Pt will improve gait speed with LRAD to at least 2.7 ft/sec in order to decr fall risk and improve community mobility.    Time  12    Period  Weeks    Status  New      PT LONG TERM GOAL #7   Title  Pt will demonstrate decreased motion sensitivity as indicated by ability to perform bending down to the floor, performing head turns/nods and body turns without any dizziness; pt will also improve use of vestibular system as indicated by ability to maintain 30 seconds on each condition of the MCTSIB.    Baseline  lightheaded-mild dizziness on MSQ; 4 seconds on conditions 2,3,4; 30 seconds on condition 1 but with increased use of ankle strategy    Time  12    Period  Weeks    Status  New      PT LONG TERM GOAL #8   Title  Pt will demonstrate ability to stand without support x 10 minutes to play corn hole and demonstrate ability to hit whiffle golf balls in grass x 10 reps with supervision    Time  12    Period  Weeks    Status  New            Plan - 12/30/19 1258    Clinical Impression Statement   Continued falls risk assessment with pt performing TUG with RW in 20 seconds indicating high falls risk.  Initiated vestibular and balance HEP focusing on gaze stability, use of gaze stability and decreased UE support for sit > stand and standing balance and use of balance reactions during head movements.  Pt demonstrated mild-moderate symptoms but symptoms settled by end of session and pt was able to ambulate out making  intermittent head and body turns to speak to therapist or husband and pt able to release RW for a few seconds and maintain balance without UE support.  Will continue to progress towards LTG.    Personal Factors and Comorbidities  Past/Current Experience;Profession    Examination-Activity Limitations  Bend;Carry;Dressing;Locomotion Level;Squat;Stairs;Lift;Stand;Transfers;Hygiene/Grooming;Bathing    Examination-Participation Restrictions  Cleaning;Community Activity;Driving;Shop;Laundry    Stability/Clinical Decision Making  Evolving/Moderate complexity    Rehab Potential  Good    PT Frequency  Other (comment)   3x/week x 8, 2x/week x 4   PT Duration  12 weeks    PT Treatment/Interventions  ADLs/Self Care Home Management;Canalith Repostioning;DME Instruction;Gait training;Stair training;Functional mobility training;Neuromuscular re-education;Balance training;Therapeutic exercise;Therapeutic activities;Patient/family education;Dry needling;Passive range of motion;Energy conservation;Vestibular;Visual/perceptual remediation/compensation    PT Next Visit Plan  Check HEP - able to increase horizontal x1 viewing to 60 seconds, vertical to 30 seconds?  Progress sit > stand to UE on thighs from elevated surface?  Progress corner balance to no UE support?  gentle stretches/ROM for cervical spine for HEP; balance on compliant surfaces, vision removed.  Gait with decreased UE support.  Wants to be able to play golf and cornhole    Consulted and Agree with Plan of Care  Patient;Family member/caregiver        Patient will benefit from skilled therapeutic intervention in order to improve the following deficits and impairments:  Abnormal gait, Decreased activity tolerance, Decreased balance, Decreased coordination, Difficulty walking, Decreased strength, Dizziness, Hypomobility, Increased muscle spasms, Pain, Decreased mobility  Visit Diagnosis: Unsteadiness on feet  Ataxic gait  Other abnormalities of gait and mobility  Muscle weakness (generalized)  Other symptoms and signs involving the nervous system  Dizziness and giddiness     Problem List There are no problems to display for this patient.   Dierdre Highman, PT, DPT 12/30/19    1:04 PM    [facility] [address] [place], [place], [address] Phone: [phone]   Fax:  [phone]  Name: Jane Hendricks MRN: [id number] Date of Birth: Feb 22, 1981

## 2019-12-30 NOTE — Patient Instructions (Signed)
Access Code: [email or web address] URL: [email or web address] Date: 12/30/2019 Prepared by: Bufford Lope  Exercises Seated Gaze Stabilization with Head Rotation - 2 sets - 30 seconds hold - 1x daily - 7x weekly Seated Gaze Stabilization with Head Nod - 2 sets - 10 seconds hold - 1x daily - 7x weekly Sit to Stand with Arm Reach Toward Target - 5 reps - 2 sets - 1x daily - 7x weekly Standing Balance in Corner - 10 reps - 2 sets - 1x daily - 7x weekly

## 2020-01-03 ENCOUNTER — Ambulatory Visit: Payer: 59 | Admitting: Family Medicine

## 2020-01-03 ENCOUNTER — Other Ambulatory Visit: Payer: Self-pay

## 2020-01-03 VITALS — Ht 64.5 in

## 2020-01-03 DIAGNOSIS — S060X9A Concussion with loss of consciousness of unspecified duration, initial encounter: Secondary | ICD-10-CM | POA: Insufficient documentation

## 2020-01-03 DIAGNOSIS — E041 Nontoxic single thyroid nodule: Secondary | ICD-10-CM

## 2020-01-03 DIAGNOSIS — S060X9D Concussion with loss of consciousness of unspecified duration, subsequent encounter: Secondary | ICD-10-CM

## 2020-01-03 DIAGNOSIS — M542 Cervicalgia: Secondary | ICD-10-CM

## 2020-01-03 DIAGNOSIS — F5101 Primary insomnia: Secondary | ICD-10-CM

## 2020-01-03 MED ORDER — FLUOXETINE HCL 20 MG PO CAPS: 40.0000 mg | ORAL_CAPSULE | Freq: Every day | ORAL | 1 refills | Status: DC

## 2020-01-03 MED ORDER — TRAZODONE HCL 50 MG PO TABS: 50.0000 mg | ORAL_TABLET | Freq: Every day | ORAL | 1 refills | Status: DC

## 2020-01-03 MED ORDER — HYDROCODONE-ACETAMINOPHEN 5-325 MG PO TABS: 1.0000 | ORAL_TABLET | Freq: Four times a day (QID) | ORAL | 0 refills | Status: DC | PRN

## 2020-01-03 NOTE — Patient Instructions (Addendum)
Thank you for coming in today.  We will add some PT specifically for your cervical spine.  Increase prozac to 40mg  daily. Use hydrocodone sparingly.  OK to continue to advance work as tolerated. I am happy to write notes or forms if needed.  OK to drive when PT gives you the go-ahead and you feel safe driving.  Practice a bit first.  Plan for thyroid. You should her about that soon.   Lets plan on recheck 1st week of April.   Fluoxetine capsules or tablets (Depression/Mood Disorders) What is this medicine? FLUOXETINE (floo OX e teen) belongs to a class of drugs known as selective serotonin reuptake inhibitors (SSRIs). It helps to treat mood problems such as depression, obsessive compulsive disorder, and panic attacks. It can also treat certain eating disorders. This medicine may be used for other purposes; ask your health care provider or pharmacist if you have questions. COMMON BRAND NAME(S): Prozac What should I tell my health care provider before I take this medicine? They need to know if you have any of these conditions:  bipolar disorder or a family history of bipolar disorder  bleeding disorders  glaucoma  heart disease  liver disease  low levels of sodium in the blood  seizures  suicidal thoughts, plans, or attempt; a previous suicide attempt by you or a family member  take MAOIs like Carbex, Eldepryl, Marplan, Nardil, and Parnate  take medicines that treat or prevent blood clots  thyroid disease  an unusual or allergic reaction to fluoxetine, other medicines, foods, dyes, or preservatives  pregnant or trying to get pregnant  breast-feeding How should I use this medicine? Take this medicine by mouth with a glass of water. Follow the directions on the prescription label. You can take this medicine with or without food. Take your medicine at regular intervals. Do not take it more often than directed. Do not stop taking this medicine suddenly except upon the  advice of your doctor. Stopping this medicine too quickly may cause serious side effects or your condition may worsen. A special MedGuide will be given to you by the pharmacist with each prescription and refill. Be sure to read this information carefully each time. Talk to your pediatrician regarding the use of this medicine in children. While this drug may be prescribed for children as young as 7 years for selected conditions, precautions do apply. Overdosage: If you think you have taken too much of this medicine contact a poison control center or emergency room at once. NOTE: This medicine is only for you. Do not share this medicine with others. What if I miss a dose? If you miss a dose, skip the missed dose and go back to your regular dosing schedule. Do not take double or extra doses. What may interact with this medicine? Do not take this medicine with any of the following medications:  other medicines containing fluoxetine, like Sarafem or Symbyax  cisapride  dronedarone  linezolid  MAOIs like Carbex, Eldepryl, Marplan, Nardil, and Parnate  methylene blue (injected into a vein)  pimozide  thioridazine This medicine may also interact with the following medications:  alcohol  amphetamines  aspirin and aspirin-like medicines  carbamazepine  certain medicines for depression, anxiety, or psychotic disturbances  certain medicines for migraine headaches like almotriptan, eletriptan, frovatriptan, naratriptan, rizatriptan, sumatriptan, zolmitriptan  digoxin  diuretics  fentanyl  flecainide  furazolidone  isoniazid  lithium  medicines for sleep  medicines that treat or prevent blood clots like warfarin, enoxaparin, and  dalteparin  NSAIDs, medicines for pain and inflammation, like ibuprofen or naproxen  other medicines that prolong the QT interval (an abnormal heart rhythm)  phenytoin  procarbazine  propafenone  rasagiline  ritonavir  supplements like  St. John's wort, kava kava, valerian  tramadol  tryptophan  vinblastine This list may not describe all possible interactions. Give your health care provider a list of all the medicines, herbs, non-prescription drugs, or dietary supplements you use. Also tell them if you smoke, drink alcohol, or use illegal drugs. Some items may interact with your medicine. What should I watch for while using this medicine? Tell your doctor if your symptoms do not get better or if they get worse. Visit your doctor or health care professional for regular checks on your progress. Because it may take several weeks to see the full effects of this medicine, it is important to continue your treatment as prescribed by your doctor. Patients and their families should watch out for new or worsening thoughts of suicide or depression. Also watch out for sudden changes in feelings such as feeling anxious, agitated, panicky, irritable, hostile, aggressive, impulsive, severely restless, overly excited and hyperactive, or not being able to sleep. If this happens, especially at the beginning of treatment or after a change in dose, call your health care professional. Dennis Bast may get drowsy or dizzy. Do not drive, use machinery, or do anything that needs mental alertness until you know how this medicine affects you. Do not stand or sit up quickly, especially if you are an older patient. This reduces the risk of dizzy or fainting spells. Alcohol may interfere with the effect of this medicine. Avoid alcoholic drinks. Your mouth may get dry. Chewing sugarless gum or sucking hard candy, and drinking plenty of water may help. Contact your doctor if the problem does not go away or is severe. This medicine may affect blood sugar levels. If you have diabetes, check with your doctor or health care professional before you change your diet or the dose of your diabetic medicine. What side effects may I notice from receiving this medicine? Side effects  that you should report to your doctor or health care professional as soon as possible:  allergic reactions like skin rash, itching or hives, swelling of the face, lips, or tongue  anxious  black, tarry stools  breathing problems  changes in vision  confusion  elevated mood, decreased need for sleep, racing thoughts, impulsive behavior  eye pain  fast, irregular heartbeat  feeling faint or lightheaded, falls  feeling agitated, angry, or irritable  hallucination, loss of contact with reality  loss of balance or coordination  loss of memory  painful or prolonged erections  restlessness, pacing, inability to keep still  seizures  stiff muscles  suicidal thoughts or other mood changes  trouble sleeping  unusual bleeding or bruising  unusually weak or tired  vomiting Side effects that usually do not require medical attention (report to your doctor or health care professional if they continue or are bothersome):  change in appetite or weight  change in sex drive or performance  diarrhea  dry mouth  headache  increased sweating  nausea  tremors This list may not describe all possible side effects. Call your doctor for medical advice about side effects. You may report side effects to FDA at 1-800-FDA-1088. Where should I keep my medicine? Keep out of the reach of children. Store at room temperature between 15 and 30 degrees C (59 and 86 degrees F). Throw  away any unused medicine after the expiration date. NOTE: This sheet is a summary. It may not cover all possible information. If you have questions about this medicine, talk to your doctor, pharmacist, or health care provider.  2020 [employer]vier/Gold Standard (2018-06-11 11:56:53)
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
## 2020-01-04 ENCOUNTER — Other Ambulatory Visit: Payer: Self-pay

## 2020-01-04 ENCOUNTER — Ambulatory Visit: Payer: 59 | Attending: Family Medicine | Admitting: Physical Therapy

## 2020-01-04 DIAGNOSIS — R260 Ataxic gait: Secondary | ICD-10-CM

## 2020-01-04 DIAGNOSIS — R2689 Other abnormalities of gait and mobility: Secondary | ICD-10-CM

## 2020-01-04 DIAGNOSIS — M6281 Muscle weakness (generalized): Secondary | ICD-10-CM

## 2020-01-04 DIAGNOSIS — R42 Dizziness and giddiness: Secondary | ICD-10-CM

## 2020-01-04 DIAGNOSIS — R2681 Unsteadiness on feet: Secondary | ICD-10-CM

## 2020-01-04 DIAGNOSIS — R29818 Other symptoms and signs involving the nervous system: Secondary | ICD-10-CM

## 2020-01-05 ENCOUNTER — Telehealth: Payer: Self-pay | Admitting: Family Medicine

## 2020-01-05 NOTE — Telephone Encounter (Signed)
Called patient informed of [place] phone number

## 2020-01-05 NOTE — Telephone Encounter (Signed)
Patient called stating that she has not received a call to schedule her ultrasound. She was told to follow up with [place] if they had not called her by Tuesday.  She can be reached on her work number today [phone]).  Please advise.

## 2020-01-05 NOTE — Therapy (Signed)
[facility] [address] [place], [place], [address] Phone: [phone]   Fax:  [phone]  Physical Therapy Treatment  Patient Details  Name: Jane Hendricks MRN: [id number] Date of Birth: 1981-01-27 Referring Provider (PT): Lynne Leader, MD   Encounter Date: 01/04/2020  PT End of Session - 01/05/20 1354    Visit Number  4    Number of Visits  33    Date for PT Re-Evaluation  03/21/20    Authorization Type  [employer] - $40 copay - VL: 60 (PT/OT/ST combined)    Authorization - Visit Number  4    Authorization - Number of Visits  60    PT Start Time  3825    PT Stop Time  1831    PT Time Calculation (min)  44 min    Activity Tolerance  Patient tolerated treatment well    Behavior During Therapy  [facility] for tasks assessed/performed       Past Medical History:  Diagnosis Date  . Bone spur of other site    C5-C6  . Herniated disc, cervical   . Kidney stones     Past Surgical History:  Procedure Laterality Date  . ANKLE SURGERY    . DILATION AND CURETTAGE OF UTERUS    . WISDOM TOOTH EXTRACTION      There were no vitals filed for this visit.  Subjective Assessment - 01/04/20 1755    Subjective  Felt ok after last session, was really sore when she got home. Has been doing her exercises at home. Saw Dr. Georgina Snell yesterday - requesting to get dry needling and wanted pt to get ortho PT for neck pain, but pt already being seen here for both. Goes back to see him on the 6th of April.    Patient is accompained by:  Family member   both parents   Pertinent History  PMH: hx of herniated disc in cervical spine    Diagnostic tests  MRI 2/13 cervical spine: No marrow edema to suggest acute cervical spine fracture. Cervical spondylosis as outlined. No more than mild spinal canalstenosis at any level. Multilevel neural foraminal narrowinggreatest on the left at C4-C5 and bilaterally at C5-C6(moderate/advanced at these sites).MRI brain:  Normal MRI appearance of the brain for age. No evidence of acuteintracranial abnormality    Patient Stated Goals  be able to walk and drive again to be more independent; to play golf and cornhole again    Currently in Pain?  Yes    Pain Score  4     Pain Location  Neck    Pain Descriptors / Indicators  Aching;Sore;Stabbing   stabbing posterior R ear   Aggravating Factors   stabbing up higher on neck, tender near upper traps    Pain Relieving Factors  medication                        Vestibular Treatment/Exercise - 01/04/20 1807      Vestibular Treatment/Exercise   Vestibular Treatment Provided  Gaze      X1 Viewing Horizontal   Foot Position  seated    Reps  3    Comments  30 seconds, mild symptoms, 3/10 dizziness, cues for grounding and keeping feet flat on the ground      X1 Viewing Vertical   Foot Position  seated    Reps  2    Comments  incr in pain in back of neck and both sides,  4/10 dizziness, cues for grounding and keeping feet flat on the ground           01/04/20 0001  Transfers  Transfers Sit to Stand;Stand to Sit  Sit to Stand 5: Supervision;With upper extremity assist;Without upper extremity assist  Sit to Stand Details Verbal cues for sequencing;Verbal cues for technique  Sit to Stand Details (indicate cue type and reason) cues for technique and scooting towards edge of mat and keeping feet closer to body  Stand to Sit 5: Supervision;With upper extremity assist  Comments sit <> stands from higher mat table at a height of 24.5 inches 1 x 8 reps with BUE support from mat table, 1 x 8 reps with placing BUE on thighs and coming to stand  Ambulation/Gait  Ambulation/Gait Yes  Ambulation/Gait Assistance 5: Supervision  Ambulation/Gait Assistance Details pt ambulated back into clinic with RW  Ambulation Distance (Feet) 100 Feet  Assistive device Rolling walker  Gait Pattern Step-through pattern;Decreased dorsiflexion - right;Decreased dorsiflexion  - left;Right foot flat;Left foot flat;Ataxic;Trunk flexed;Narrow base of support;Poor foot clearance - left;Poor foot clearance - right  Ambulation Surface Indoor;Level  Therapeutic Activites   Therapeutic Activities Other Therapeutic Activities  Other Therapeutic Activities discussed pt's recent visit to see Dr. Denyse Amass for her concussion, Dr. Denyse Amass recommending dry needling for neck musculature, therapist educating pt that it is apart of her plan of care and one of our physical therapists here is certified in dry needling, pt has received dry needling in the past for neck pain as well.  Neck Exercises: Seated  Other Seated Exercise attempted gentle seated upper trap stretch, however pt unable to tolerate due to pain  Neck Exercises: Supine  Cervical Isometrics Right rotation;Left rotation  Cervical Isometrics Limitations 1 x 5 seconds B - cues for technique, no increased pain when performing   Other Supine Exercise gentle PROM into L cervical rotation with gentle stretch to R neck musculature, pt reporting no incr in pain and a stretch, 5 x 20 second bouts   Other Supine Exercise performed gentle supine distraction, 3 x 10-15 seconds, pt reporting incr pain behind R ear and feeling sensation down her back       PT Education - 01/05/20 1353    Education Details  continue with HEP, sit to stand technique    Person(s) Educated  Patient    Methods  Explanation;Demonstration    Comprehension  Verbalized understanding;Returned demonstration       PT Short Term Goals - 12/30/19 1257      PT SHORT TERM GOAL #1   Title  Pt will be independent with initial HEP in order to build upon functional gains made in therapy. ALL STGS DUE 02/03/20    Time  6   due to delay in scheduling   Period  Weeks    Status  New    Target Date  02/03/20      PT SHORT TERM GOAL #2   Title  Pt will undergo further vestibular assessment - goals to be written as appropriate.    Time  6    Period  Weeks    Status   Achieved      PT SHORT TERM GOAL #3   Title  Pt will undergo assessment of TUG to determine fall risk  - goal to be written as appropriate.    Time  6    Period  Weeks    Status  Achieved      PT SHORT TERM GOAL #  4   Title  Pt will improve gait speed with RW to at least 2.3 ft/sec in order to decr fall risk and improve community mobility.    Baseline  1.78 ft/sec with RW.    Time  6    Period  Weeks    Status  New      PT SHORT TERM GOAL #5   Title  Pt will improve B cervical rotation AROM to at least 40 degrees with mild pain in order to improve functional mobility.    Time  6    Period  Weeks    Status  New        PT Long Term Goals - 12/30/19 1258      PT LONG TERM GOAL #1   Title  Pt will be independent with final HEP in order to build upon functional gains made in therapy. ALL LTGS DUE 03/21/20    Time  12    Period  Weeks    Status  New      PT LONG TERM GOAL #2   Title  Pt will ambulate at least 300' with LRAD with supervision over level and unlevel surfaces in order to improve community mobility.    Time  12    Period  Weeks    Status  New      PT LONG TERM GOAL #3   Title  Pt will improve BERG score to at least a 30/56 in order to determine decr fall risk.    Baseline  14/56 on 12/23/19    Time  12    Period  Weeks    Status  New      PT LONG TERM GOAL #4   Title  Pt will improve B cervical rotation AROM to 65 degrees for functional ROM for driving.    Baseline  R rotation 43 degrees, L rotation 25 degrees    Time  12    Period  Weeks    Status  New      PT LONG TERM GOAL #5   Title  Pt will decrease TUG time to </= 13.5 seconds without use of AD    Baseline  20.22 seconds with RW    Time  12    Period  Weeks    Status  Revised      PT LONG TERM GOAL #6   Title  Pt will improve gait speed with LRAD to at least 2.7 ft/sec in order to decr fall risk and improve community mobility.    Time  12    Period  Weeks    Status  New      PT LONG TERM GOAL  #7   Title  Pt will demonstrate decreased motion sensitivity as indicated by ability to perform bending down to the floor, performing head turns/nods and body turns without any dizziness; pt will also improve use of vestibular system as indicated by ability to maintain 30 seconds on each condition of the MCTSIB.    Baseline  lightheaded-mild dizziness on MSQ; 4 seconds on conditions 2,3,4; 30 seconds on condition 1 but with increased use of ankle strategy    Time  12    Period  Weeks    Status  New      PT LONG TERM GOAL #8   Title  Pt will demonstrate ability to stand without support x 10 minutes to play corn hole and demonstrate ability to hit whiffle golf balls in grass x 10  reps with supervision    Time  12    Period  Weeks    Status  New            Plan - 01/05/20 2058    Clinical Impression Statement  Reviewed seated VOR x1 exercise from pt's HEP - unable to progress time in seated in today's session. Pt continues with mild symptoms for horizontal x1 for 30 seconds and unable to tolerate for >10 seconds with vertical x1. Able to progress sit <> stands today from an elevated mat surface with BUE support on knees, needing verbal and demonstrative cues for proper technique. Pt able to tolerate gentle PROM and cervical isometrics without an incr in pain. Will continue to progress towards LTGs.    Personal Factors and Comorbidities  Past/Current Experience;Profession    Examination-Activity Limitations  Bend;Carry;Dressing;Locomotion Level;Squat;Stairs;Lift;Stand;Transfers;Hygiene/Grooming;Bathing    Examination-Participation Restrictions  Cleaning;Community Activity;Driving;Shop;Laundry    Stability/Clinical Decision Making  Evolving/Moderate complexity    Rehab Potential  Good    PT Frequency  Other (comment)   3x/week x 8, 2x/week x 4   PT Duration  12 weeks    PT Treatment/Interventions  ADLs/Self Care Home Management;Canalith Repostioning;DME Instruction;Gait training;Stair  training;Functional mobility training;Neuromuscular re-education;Balance training;Therapeutic exercise;Therapeutic activities;Patient/family education;Dry needling;Passive range of motion;Energy conservation;Vestibular;Visual/perceptual remediation/compensation    PT Next Visit Plan  able to increase horizontal x1 viewing to 60 seconds, vertical to 30 seconds?  Progress sit > stand to UE on thighs from elevated surface?  Progress corner balance to no UE support?  gentle stretches/ROM for cervical spine for HEP; balance on compliant surfaces, vision removed.  Gait with decreased UE support.  Wants to be able to play golf and cornhole    Consulted and Agree with Plan of Care  Patient;Family member/caregiver       Patient will benefit from skilled therapeutic intervention in order to improve the following deficits and impairments:  Abnormal gait, Decreased activity tolerance, Decreased balance, Decreased coordination, Difficulty walking, Decreased strength, Dizziness, Hypomobility, Increased muscle spasms, Pain, Decreased mobility  Visit Diagnosis: Unsteadiness on feet  Ataxic gait  Other abnormalities of gait and mobility  Muscle weakness (generalized)  Other symptoms and signs involving the nervous system  Dizziness and giddiness     Problem List Patient Active Problem List   Diagnosis Date Noted  . Concussion with loss of consciousness 01/03/2020  . Thyroid nodule 01/03/2020  . Irregular menses 04/28/2019  . Neuropathy 04/28/2019  . Infertility counseling 10/01/2017  . DDD (degenerative disc disease), cervical 11/29/2016  . Hypoglycemia 11/29/2016  . Kidney stone 11/29/2016  . Sinusitis 11/29/2016  . Insomnia 03/08/2016  . Neck pain 03/08/2016  . 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 [place], [place], [address] Phone: [phone]   Fax:   [phone]  Name: Jane Hendricks MRN: [id number] Date of Birth: [birth year]

## 2020-01-05 NOTE — Telephone Encounter (Signed)
She can contact [facility] at [phone] to schedule this appt.

## 2020-01-06 ENCOUNTER — Other Ambulatory Visit: Payer: Self-pay

## 2020-01-06 ENCOUNTER — Ambulatory Visit: Payer: 59 | Admitting: Physical Therapy

## 2020-01-06 DIAGNOSIS — R29818 Other symptoms and signs involving the nervous system: Secondary | ICD-10-CM

## 2020-01-06 DIAGNOSIS — R2681 Unsteadiness on feet: Secondary | ICD-10-CM

## 2020-01-06 DIAGNOSIS — R260 Ataxic gait: Secondary | ICD-10-CM

## 2020-01-06 DIAGNOSIS — M6281 Muscle weakness (generalized): Secondary | ICD-10-CM

## 2020-01-06 DIAGNOSIS — R2689 Other abnormalities of gait and mobility: Secondary | ICD-10-CM

## 2020-01-06 DIAGNOSIS — R42 Dizziness and giddiness: Secondary | ICD-10-CM

## 2020-01-06 NOTE — Therapy (Signed)
[facility] [address] [place], [place], [address] Phone: [phone]   Fax:  [phone]  Physical Therapy Treatment  Patient Details  Name: Jane Hendricks MRN: [id number] Date of Birth: 05-Feb-1981 Referring Provider (PT): Clementeen Graham, MD   Encounter Date: 01/06/2020  PT End of Session - 01/06/20 1225    Visit Number  5    Number of Visits  33    Date for PT Re-Evaluation  03/21/20    Authorization Type  [employer] - $40 copay - VL: 60 (PT/OT/ST combined)    Authorization - Visit Number  5    Authorization - Number of Visits  60    PT Start Time  0845    PT Stop Time  0931    PT Time Calculation (min)  46 min    Equipment Utilized During Treatment  Gait belt    Activity Tolerance  Patient tolerated treatment well    Behavior During Therapy  WFL for tasks assessed/performed       Past Medical History:  Diagnosis Date  . Bone spur of other site    C5-C6  . Herniated disc, cervical   . Kidney stones     Past Surgical History:  Procedure Laterality Date  . ANKLE SURGERY    . DILATION AND CURETTAGE OF UTERUS    . WISDOM TOOTH EXTRACTION      There were no vitals filed for this visit.  Subjective Assessment - 01/06/20 0847    Subjective  Has been trying to the sit <> stands at home - it was too easy from her bed as it was too high, and the other chairs in her house are too low. Other than when she does her exercises, she hasn't had any dizziness. Almost fell twice yesterday - caught herself on the wall yesterday and then another episode was when she was moving too fast.    Patient is accompained by:  Family member   both parents   Pertinent History  PMH: hx of herniated disc in cervical spine    Diagnostic tests  MRI 2/13 cervical spine: No marrow edema to suggest acute cervical spine fracture. Cervical spondylosis as outlined. No more than mild spinal canalstenosis at any level. Multilevel neural foraminal  narrowinggreatest on the left at C4-C5 and bilaterally at C5-C6(moderate/advanced at these sites).MRI brain: Normal MRI appearance of the brain for age. No evidence of acuteintracranial abnormality    Patient Stated Goals  be able to walk and drive again to be more independent; to play golf and cornhole again    Currently in Pain?  Yes    Pain Score  4    pain is closer to a 6/10 when walking   Pain Location  Neck   and upper back                     [facility] Adult PT Treatment/Exercise - 01/06/20 0001      Transfers   Transfers  Sit to Stand;Stand to Sit    Sit to Stand  5: Supervision    Sit to Stand Details  Verbal cues for sequencing;Verbal cues for technique    Stand to Sit  5: Supervision    Stand to Sit Details (indicate cue type and reason)  Visual cues/gestures for sequencing;Verbal cues for sequencing;Verbal cues for technique    Comments  Pt demonstrating sit <> stand from HEP with placing R hand on front of walker, keeping arm straight, with  pt demonstrating lean to R and trunk rotation after 2-3 reps, tried alternating between having L hand on front of the walker, but pt unable to perform. Instead tried sit <> stands from lower mat table with using BUE to push from side of the mat - pt able to perform 2 x 5 reps with no difficulty with equal weight shift and anterior weight shift, replaced with previous one hand technique to HEP. Performed 2 x 5 sit <> stands from chair with 2 pillows (for a height of 22 inches) with BUE support pushing from thighs, pt able to perform with no difficulty. Took one pillow away and pt could not perform. Added to HEP about sitting on a standard chair with pillows for increased height and using BUE to push from thighs.       Neck Exercises: Seated   Other Seated Exercise  in chair with back support: gentle cervical rotation AROM x5 reps both sides, cued to perform slowly and stop before pain. cervical flexion AROM, to tolerance, performing  slowly and cues for breathing x5 reps - added to HEP          Balance Exercises - 01/06/20 1627      Balance Exercises: Standing   Other Standing Exercises  Standing in corner with no UE support with wide BOS: attempted static standing balance, pt only able to stand for approx. 10-15 seconds before having posterior LOB and bending knees to fall back into the wall. With BUE support and continued wide BOS practiced anterior weight shifting x10 reps slowing bringing weight into toes and then heels, x10 reps slow weight shift R and L. performed static standing balance again - pt now able to demonstrate longer holds, with better weight shift onto toes/balls of foot to maintain balance. Added to HEP to perform weight shifting before corner balance with no UE support         Access Code: [email or web address]  URL: [email or web address]  Date: 01/06/2020  Prepared by: Janann August   Exercises bolded are new/revised additions to HEP:   Exercises Seated Gaze Stabilization with Head Rotation - 2 sets - 30 seconds hold - 1x daily - 7x weekly Seated Gaze Stabilization with Head Nod - 2 sets - 10 seconds hold - 1x daily - 7x weekly Standing Balance in Corner - 10 reps - 2 sets - 1x daily - 7x weekly - prior to performing 10 reps A/P weight shifting, 10 reps M/L weight shifting  Sit to Stand with Hands on Knees - 5 reps - 2 sets - 2x daily - 7x weekly - from higher surface, seated on 2 pillows, using BUE onto thighs  Sit to Stand with Arm Support - 5 reps - 2 sets - 2x daily - 7x weekly -pushing off from mat table at lower surfaces  Seated Cervical Rotation AROM - 4-5 reps - 4 sets - 1x daily - 7x weekly Seated Cervical Sidebending AROM - 4-5 reps - 4 sets - 1x daily - 7x weekly   PT Education - 01/06/20 1225    Education Details  new additons to HEP, sit <> stand technique    Person(s) Educated  Patient    Methods  Explanation;Demonstration;Handout    Comprehension  Verbalized  understanding;Returned demonstration       PT Short Term Goals - 12/30/19 1257      PT SHORT TERM GOAL #1   Title  Pt will be independent with initial HEP in order to build upon functional gains made in  therapy. ALL STGS DUE 02/03/20    Time  6   due to delay in scheduling   Period  Weeks    Status  New    Target Date  02/03/20      PT SHORT TERM GOAL #2   Title  Pt will undergo further vestibular assessment - goals to be written as appropriate.    Time  6    Period  Weeks    Status  Achieved      PT SHORT TERM GOAL #3   Title  Pt will undergo assessment of TUG to determine fall risk  - goal to be written as appropriate.    Time  6    Period  Weeks    Status  Achieved      PT SHORT TERM GOAL #4   Title  Pt will improve gait speed with RW to at least 2.3 ft/sec in order to decr fall risk and improve community mobility.    Baseline  1.78 ft/sec with RW.    Time  6    Period  Weeks    Status  New      PT SHORT TERM GOAL #5   Title  Pt will improve B cervical rotation AROM to at least 40 degrees with mild pain in order to improve functional mobility.    Time  6    Period  Weeks    Status  New        PT Long Term Goals - 12/30/19 1258      PT LONG TERM GOAL #1   Title  Pt will be independent with final HEP in order to build upon functional gains made in therapy. ALL LTGS DUE 03/21/20    Time  12    Period  Weeks    Status  New      PT LONG TERM GOAL #2   Title  Pt will ambulate at least 300' with LRAD with supervision over level and unlevel surfaces in order to improve community mobility.    Time  12    Period  Weeks    Status  New      PT LONG TERM GOAL #3   Title  Pt will improve BERG score to at least a 30/56 in order to determine decr fall risk.    Baseline  14/56 on 12/23/19    Time  12    Period  Weeks    Status  New      PT LONG TERM GOAL #4   Title  Pt will improve B cervical rotation AROM to 65 degrees for functional ROM for driving.    Baseline  R  rotation 43 degrees, L rotation 25 degrees    Time  12    Period  Weeks    Status  New      PT LONG TERM GOAL #5   Title  Pt will decrease TUG time to </= 13.5 seconds without use of AD    Baseline  20.22 seconds with RW    Time  12    Period  Weeks    Status  Revised      PT LONG TERM GOAL #6   Title  Pt will improve gait speed with LRAD to at least 2.7 ft/sec in order to decr fall risk and improve community mobility.    Time  12    Period  Weeks    Status  New      PT LONG  TERM GOAL #7   Title  Pt will demonstrate decreased motion sensitivity as indicated by ability to perform bending down to the floor, performing head turns/nods and body turns without any dizziness; pt will also improve use of vestibular system as indicated by ability to maintain 30 seconds on each condition of the MCTSIB.    Baseline  lightheaded-mild dizziness on MSQ; 4 seconds on conditions 2,3,4; 30 seconds on condition 1 but with increased use of ankle strategy    Time  12    Period  Weeks    Status  New      PT LONG TERM GOAL #8   Title  Pt will demonstrate ability to stand without support x 10 minutes to play corn hole and demonstrate ability to hit whiffle golf balls in grass x 10 reps with supervision    Time  12    Period  Weeks    Status  New            Plan - 01/06/20 1630    Clinical Impression Statement  Revised pt's sit <> stand exercise for HEP - pt demonstrating with one UE support on front of RW with incr trunk lean and use of R arm to press to stand after 2-3 reps. Revised to perform with BUE push off from mat table with much better control and weight shift and not reliant on UE. Performed corner balance with no UE support and wide BOS with pt initially losing balance posteriorly, performed A/P and M/L weight shifting with BUE support, afterwards trialed corner balance again with no UE support with pt able to utilize ankle strategy more to maintain balance. Will continue to progress  towards LTGs.    Personal Factors and Comorbidities  Past/Current Experience;Profession    Examination-Activity Limitations  Bend;Carry;Dressing;Locomotion Level;Squat;Stairs;Lift;Stand;Transfers;Hygiene/Grooming;Bathing    Examination-Participation Restrictions  Cleaning;Community Activity;Driving;Shop;Laundry    Stability/Clinical Decision Making  Evolving/Moderate complexity    Rehab Potential  Good    PT Frequency  Other (comment)   3x/week x 8, 2x/week x 4   PT Duration  12 weeks    PT Treatment/Interventions  ADLs/Self Care Home Management;Canalith Repostioning;DME Instruction;Gait training;Stair training;Functional mobility training;Neuromuscular re-education;Balance training;Therapeutic exercise;Therapeutic activities;Patient/family education;Dry needling;Passive range of motion;Energy conservation;Vestibular;Visual/perceptual remediation/compensation    PT Next Visit Plan  able to increase horizontal x1 viewing to 60 seconds, vertical to 30 seconds?  Progress sit > stand to UE on thighs from elevated surface.  Progress corner balance-static with no UE support, narrow BOS.  gentle stretches/ROM for cervical spine for HEP; balance on compliant surfaces, vision removed.  Gait with decreased UE support.  Wants to be able to play golf and cornhole    Consulted and Agree with Plan of Care  Patient;Family member/caregiver       Patient will benefit from skilled therapeutic intervention in order to improve the following deficits and impairments:  Abnormal gait, Decreased activity tolerance, Decreased balance, Decreased coordination, Difficulty walking, Decreased strength, Dizziness, Hypomobility, Increased muscle spasms, Pain, Decreased mobility  Visit Diagnosis: Unsteadiness on feet  Ataxic gait  Other abnormalities of gait and mobility  Muscle weakness (generalized)  Other symptoms and signs involving the nervous system  Dizziness and giddiness     Problem List Patient Active  Problem List   Diagnosis Date Noted  . Concussion with loss of consciousness 01/03/2020  . Thyroid nodule 01/03/2020  . Irregular menses 04/28/2019  . Neuropathy 04/28/2019  . Infertility counseling 10/01/2017  . DDD (degenerative disc disease), cervical 11/29/2016  .  Hypoglycemia 11/29/2016  . Kidney stone 11/29/2016  . Sinusitis 11/29/2016  . Insomnia 03/08/2016  . Neck pain 03/08/2016  . 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

## 2020-01-06 NOTE — Patient Instructions (Addendum)
Access Code: [email or web address]  URL: [email or web address]  Date: 01/06/2020  Prepared by: Sherlie Ban   Exercises Seated Gaze Stabilization with Head Rotation - 2 sets - 30 seconds hold - 1x daily - 7x weekly Seated Gaze Stabilization with Head Nod - 2 sets - 10 seconds hold - 1x daily - 7x weekly Standing Balance in Corner - 10 reps - 2 sets - 1x daily - 7x weekly Sit to Stand with Hands on Knees - 5 reps - 2 sets - 2x daily - 7x weekly Sit to Stand with Arm Support - 5 reps - 2 sets - 2x daily - 7x weekly Seated Cervical Rotation AROM - 4-5 reps - 4 sets - 1x daily - 7x weekly Seated Cervical Sidebending AROM - 4-5 reps - 4 sets - 1x daily - 7x weekly

## 2020-01-10 ENCOUNTER — Ambulatory Visit: Payer: 59 | Admitting: Physical Therapy

## 2020-01-10 ENCOUNTER — Telehealth: Payer: Self-pay | Admitting: Family Medicine

## 2020-01-10 ENCOUNTER — Other Ambulatory Visit: Payer: Self-pay

## 2020-01-10 DIAGNOSIS — M6281 Muscle weakness (generalized): Secondary | ICD-10-CM

## 2020-01-10 DIAGNOSIS — R42 Dizziness and giddiness: Secondary | ICD-10-CM

## 2020-01-10 DIAGNOSIS — R29818 Other symptoms and signs involving the nervous system: Secondary | ICD-10-CM

## 2020-01-10 DIAGNOSIS — R2689 Other abnormalities of gait and mobility: Secondary | ICD-10-CM

## 2020-01-10 DIAGNOSIS — R2681 Unsteadiness on feet: Secondary | ICD-10-CM

## 2020-01-10 DIAGNOSIS — R260 Ataxic gait: Secondary | ICD-10-CM

## 2020-01-10 NOTE — Telephone Encounter (Signed)
Patient called stating that she has noticed a lot of regression. Her balance and walking as gotten a lot worse. She fell twice this morning, twice yesterday, and once on Friday.  She said that the pain in her neck and shoulders seems to be worse and she is having a lot of pain in her lower back. She believes this could be coming from her neuro exercises. They have been adjusted for this reason but so far she has not had any relief.  She mentioned that she has been only taking the hydrocodone only on her therapy days but the pain was so bad this weekend she had to take one yesterday but it barley helped. She has also been taking Tylenol but this does not touch the pain at all.   She feels like she is going backwards and doesn't know what to do.  Please advise.  She can be reached on her cell phone [phone]).

## 2020-01-10 NOTE — Telephone Encounter (Signed)
Pt scheduled for tomorrow at 3pm  

## 2020-01-10 NOTE — Therapy (Signed)
[facility] [address] [place], [place], [address] Phone: [phone]   Fax:  [phone]  Physical Therapy Treatment  Patient Details  Name: Jane Hendricks MRN: [id number] Date of Birth: 23-Oct-1981 Referring Provider (PT): Clementeen Graham, MD   Encounter Date: 01/10/2020  PT End of Session - 01/10/20 1927    Visit Number  6    Number of Visits  33    Date for PT Re-Evaluation  03/21/20    Authorization Type  [employer] - $40 copay - VL: 60 (PT/OT/ST combined)    Authorization - Visit Number  6    Authorization - Number of Visits  60    PT Start Time  1318    PT Stop Time  1400    PT Time Calculation (min)  42 min    Equipment Utilized During Treatment  Gait belt    Activity Tolerance  Patient tolerated treatment well    Behavior During Therapy  WFL for tasks assessed/performed       Past Medical History:  Diagnosis Date  . Bone spur of other site    C5-C6  . Herniated disc, cervical   . Kidney stones     Past Surgical History:  Procedure Laterality Date  . ANKLE SURGERY    . DILATION AND CURETTAGE OF UTERUS    . WISDOM TOOTH EXTRACTION      There were no vitals filed for this visit.  Subjective Assessment - 01/10/20 1321    Subjective  Has fallen 4 times since she has been since last thursday. Has not been able to do her exercises because she has been in too much pain. Had a fall at night trying to get back from the bathroom - most have happened in the iddle of the night/evening. Her husband states that she has slow falls - "timbering process". Dizziness has been getting better - pain is feeling more muscular vs. headache.    Patient is accompained by:  Family member   both parents   Pertinent History  PMH: hx of herniated disc in cervical spine    Diagnostic tests  MRI 2/13 cervical spine: No marrow edema to suggest acute cervical spine fracture. Cervical spondylosis as outlined. No more than mild spinal  canalstenosis at any level. Multilevel neural foraminal narrowinggreatest on the left at C4-C5 and bilaterally at C5-C6(moderate/advanced at these sites).MRI brain: Normal MRI appearance of the brain for age. No evidence of acuteintracranial abnormality    Patient Stated Goals  be able to walk and drive again to be more independent; to play golf and cornhole again    Currently in Pain?  Yes    Pain Score  3    low back, neck is at a 4-5/10   Pain Relieving Factors  tylenol, heating pad.                       [facility] Adult PT Treatment/Exercise - 01/10/20 1933      Ambulation/Gait   Ambulation/Gait  Yes    Ambulation/Gait Assistance  5: Supervision    Ambulation/Gait Assistance Details  pt ambulates with more narrow BOS and increased inversion at times    Ambulation Distance (Feet)  100 Feet    Assistive device  Rolling walker    Gait Pattern  Step-through pattern;Decreased dorsiflexion - right;Decreased dorsiflexion - left;Right foot flat;Left foot flat;Ataxic;Trunk flexed;Narrow base of support;Poor foot clearance - left;Poor foot clearance - right    Ambulation Surface  Level;Indoor          Balance Exercises - 01/10/20 1933      Balance Exercises: Standing   Standing Eyes Opened  Wide ([place]);Limitations;Solid surface    Standing Eyes Opened Limitations  in corner - no UE support, during 1st rep pt initially losing balance posteriorly and bends knees, needing min A for balance, performed 2 additional reps for approx. 15 seconds with cues for diaphragmatic breathing, with pt able to perform with more control and cues for weight shifting through feet    Standing Eyes Closed  Wide (BOA);Solid surface;4 reps;10 secs;Limitations  With single UE support - cues for breathing throughout, needing min A as pt loses balance posteriorly, reps approx. 10-15 seconds each. Reported dizziness as 3/10 after performing, waited for symptoms to subside before continuing     Sidestepping   Upper extremity support;Other (comment);Limitations    Sidestepping Limitations  down and back in // bars: x4 reps, cues for weight shifting and taking smaller step lengths, performing first with BUE support, then B fingertip support and then single UE support     Marching  Upper extremity assist 1;Upper extremity assist 2;Limitations    Marching Limitations  marching at first with BUE support progressing to single UE support, cues for slowed and controlled and incr hip/knee flexion 2 x 10 reps     Other Standing Exercises  standing in // bars: staggered stance A/P weight shifting x15 reps each, cues for technique, progressing to BUE support to single UE support and then single UE fingertip support, pt needing close min guard     Other Standing Exercises Comments  with BUE support: 1 x 10 reps B stepping over and weight shifting over black foam beam at lowest height and then stepping leg back over, cues for weight shift, pt with increased difficulty shifting weight towards LLE and stepping RLE back over beam, as reps progressed took increased time and effort to perform         PT Education - 01/10/20 1927    Education Details  continue with current HEP    Person(s) Educated  Patient;Spouse    Methods  Explanation    Comprehension  Verbalized understanding       PT Short Term Goals - 12/30/19 1257      PT SHORT TERM GOAL #1   Title  Pt will be independent with initial HEP in order to build upon functional gains made in therapy. ALL STGS DUE 02/03/20    Time  6   due to delay in scheduling   Period  Weeks    Status  New    Target Date  02/03/20      PT SHORT TERM GOAL #2   Title  Pt will undergo further vestibular assessment - goals to be written as appropriate.    Time  6    Period  Weeks    Status  Achieved      PT SHORT TERM GOAL #3   Title  Pt will undergo assessment of TUG to determine fall risk  - goal to be written as appropriate.    Time  6    Period  Weeks    Status   Achieved      PT SHORT TERM GOAL #4   Title  Pt will improve gait speed with RW to at least 2.3 ft/sec in order to decr fall risk and improve community mobility.    Baseline  1.78 ft/sec with RW.  Time  6    Period  Weeks    Status  New      PT SHORT TERM GOAL #5   Title  Pt will improve B cervical rotation AROM to at least 40 degrees with mild pain in order to improve functional mobility.    Time  6    Period  Weeks    Status  New        PT Long Term Goals - 12/30/19 1258      PT LONG TERM GOAL #1   Title  Pt will be independent with final HEP in order to build upon functional gains made in therapy. ALL LTGS DUE 03/21/20    Time  12    Period  Weeks    Status  New      PT LONG TERM GOAL #2   Title  Pt will ambulate at least 300' with LRAD with supervision over level and unlevel surfaces in order to improve community mobility.    Time  12    Period  Weeks    Status  New      PT LONG TERM GOAL #3   Title  Pt will improve BERG score to at least a 30/56 in order to determine decr fall risk.    Baseline  14/56 on 12/23/19    Time  12    Period  Weeks    Status  New      PT LONG TERM GOAL #4   Title  Pt will improve B cervical rotation AROM to 65 degrees for functional ROM for driving.    Baseline  R rotation 43 degrees, L rotation 25 degrees    Time  12    Period  Weeks    Status  New      PT LONG TERM GOAL #5   Title  Pt will decrease TUG time to </= 13.5 seconds without use of AD    Baseline  20.22 seconds with RW    Time  12    Period  Weeks    Status  Revised      PT LONG TERM GOAL #6   Title  Pt will improve gait speed with LRAD to at least 2.7 ft/sec in order to decr fall risk and improve community mobility.    Time  12    Period  Weeks    Status  New      PT LONG TERM GOAL #7   Title  Pt will demonstrate decreased motion sensitivity as indicated by ability to perform bending down to the floor, performing head turns/nods and body turns without any  dizziness; pt will also improve use of vestibular system as indicated by ability to maintain 30 seconds on each condition of the MCTSIB.    Baseline  lightheaded-mild dizziness on MSQ; 4 seconds on conditions 2,3,4; 30 seconds on condition 1 but with increased use of ankle strategy    Time  12    Period  Weeks    Status  New      PT LONG TERM GOAL #8   Title  Pt will demonstrate ability to stand without support x 10 minutes to play corn hole and demonstrate ability to hit whiffle golf balls in grass x 10 reps with supervision    Time  12    Period  Weeks    Status  New            Plan - 01/10/20 1931  Clinical Impression Statement  Focus of today's skilled session was standing balance activities in // bars and in corner - working on decreased UE support for balance. Pt with mild reports of dizziness today (3/10) after performing static balance with eyes closed in corner. When in corner with no UE support and wide BOS and eyes open, pt with difficulty maintaining balance >10-15 seconds, tends to shift weight posteriorly on heels and bend knees, needing min A to help maintain balance. When pt stands at [facility] with no UE support (when checking in at check in desk or at edge of mat table) for approx. 30 seconds, pt demonstrating no LOB when not attention is not fully on balance. Performed corner balance today with eyes closed on level ground, pt needing single UE support on chair in order to perform safely. Will continue to progress towards LTGs.    Personal Factors and Comorbidities  Past/Current Experience;Profession    Examination-Activity Limitations  Bend;Carry;Dressing;Locomotion Level;Squat;Stairs;Lift;Stand;Transfers;Hygiene/Grooming;Bathing    Examination-Participation Restrictions  Cleaning;Community Activity;Driving;Shop;Laundry    Stability/Clinical Decision Making  Evolving/Moderate complexity    Rehab Potential  Good    PT Frequency  Other (comment)   3x/week x 8, 2x/week x 4   PT  Duration  12 weeks    PT Treatment/Interventions  ADLs/Self Care Home Management;Canalith Repostioning;DME Instruction;Gait training;Stair training;Functional mobility training;Neuromuscular re-education;Balance training;Therapeutic exercise;Therapeutic activities;Patient/family education;Dry needling;Passive range of motion;Energy conservation;Vestibular;Visual/perceptual remediation/compensation    PT Next Visit Plan  able to increase horizontal x1 viewing to 60 seconds, vertical to 30 seconds?  Progress sit Progress corner balance-static with no UE support, narrow BOS.  Balance in // bars with decr UE support. gentle stretches/ROM for cervical spine for HEP; balance on compliant surfaces, vision removed.  Gait with decreased UE support.  Wants to be able to play golf and cornhole    Consulted and Agree with Plan of Care  Patient;Family member/caregiver       Patient will benefit from skilled therapeutic intervention in order to improve the following deficits and impairments:  Abnormal gait, Decreased activity tolerance, Decreased balance, Decreased coordination, Difficulty walking, Decreased strength, Dizziness, Hypomobility, Increased muscle spasms, Pain, Decreased mobility  Visit Diagnosis: Ataxic gait  Other abnormalities of gait and mobility  Muscle weakness (generalized)  Other symptoms and signs involving the nervous system  Dizziness and giddiness  Unsteadiness on feet     Problem List Patient Active Problem List   Diagnosis Date Noted  . Concussion with loss of consciousness 01/03/2020  . Thyroid nodule 01/03/2020  . Irregular menses 04/28/2019  . Neuropathy 04/28/2019  . Infertility counseling 10/01/2017  . DDD (degenerative disc disease), cervical 11/29/2016  . Hypoglycemia 11/29/2016  . Kidney stone 11/29/2016  . Sinusitis 11/29/2016  . Insomnia 03/08/2016  . Neck pain 03/08/2016  . 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

## 2020-01-10 NOTE — Telephone Encounter (Signed)
May be reasonable for follow-up.  Recommend recheck in clinic with me this week.  Does not have to be today.  Could be Tuesday Wednesday or Thursday or so.  Clayburn Pert

## 2020-01-11 ENCOUNTER — Encounter: Payer: Self-pay | Admitting: Family Medicine

## 2020-01-11 ENCOUNTER — Ambulatory Visit: Payer: Self-pay | Admitting: Family Medicine

## 2020-01-11 ENCOUNTER — Ambulatory Visit
Admission: RE | Admit: 2020-01-11 | Discharge: 2020-01-11 | Disposition: A | Discharge status: Home / Self Care | Payer: 59 | Source: Ambulatory Visit | Attending: Family Medicine | Admitting: Family Medicine

## 2020-01-11 VITALS — BP 122/80 | HR 74 | Ht 64.5 in | Wt 137.8 lb

## 2020-01-11 DIAGNOSIS — E041 Nontoxic single thyroid nodule: Secondary | ICD-10-CM

## 2020-01-11 DIAGNOSIS — M542 Cervicalgia: Secondary | ICD-10-CM

## 2020-01-11 DIAGNOSIS — S060X9D Concussion with loss of consciousness of unspecified duration, subsequent encounter: Secondary | ICD-10-CM

## 2020-01-11 IMAGING — US US THYROID
1 series · 13 of 25 positions shown · non-contrast
Comparison: None.

CLINICAL DATA: 38-year-old female with a history of thyroid nodule
on recent CT

EXAM:
THYROID ULTRASOUND
TECHNIQUE: Ultrasound examination of the thyroid gland and adjacent soft
tissues was performed.

[Series 1: us thyroid · 0.06mm/px · 13 of 41 slices shown]
[im 1/41]
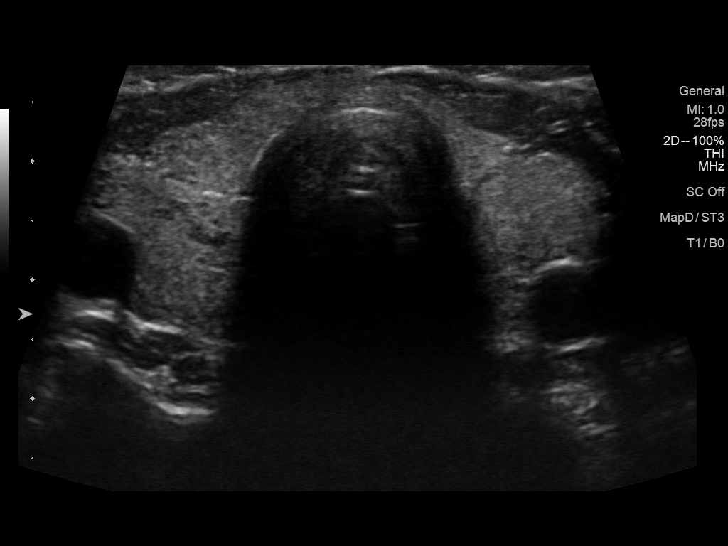
[im 4/41]
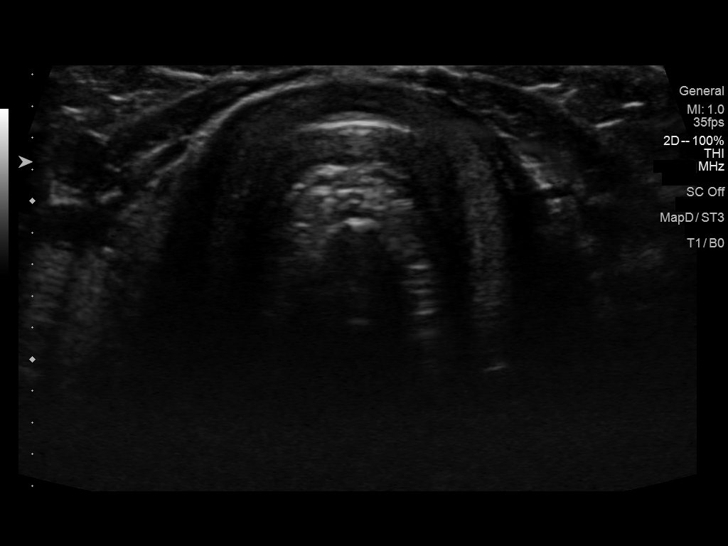
[im 7/41]
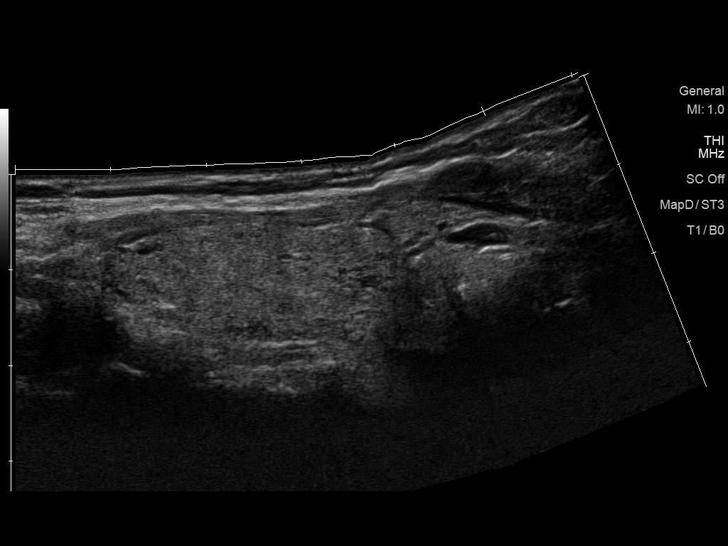
[im 11/41]
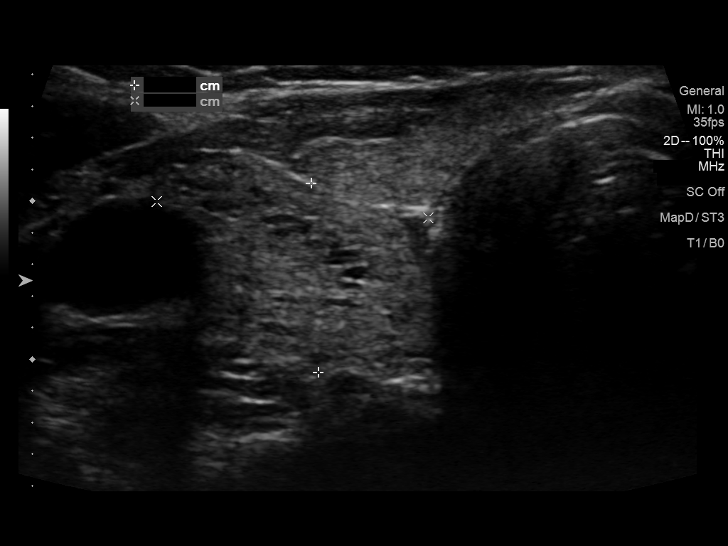
[im 14/41]
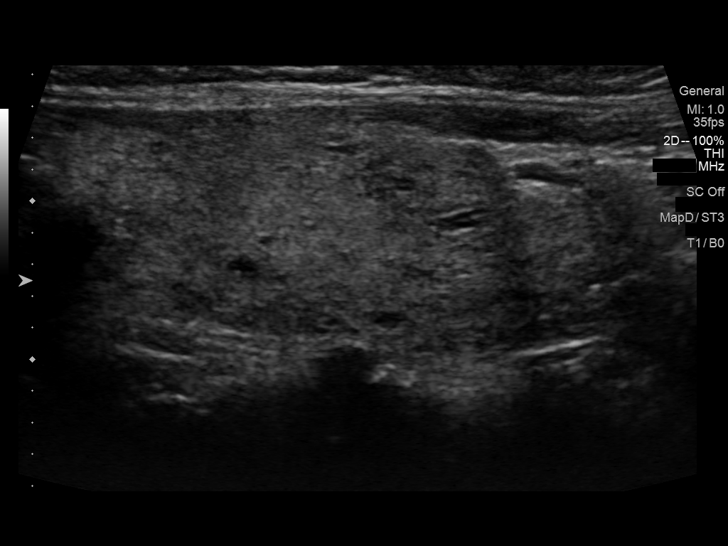
[im 17/41]
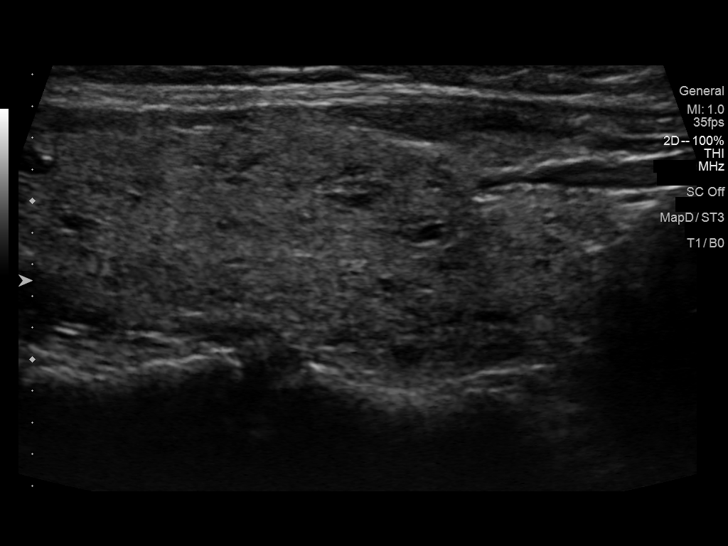
[im 21/41]
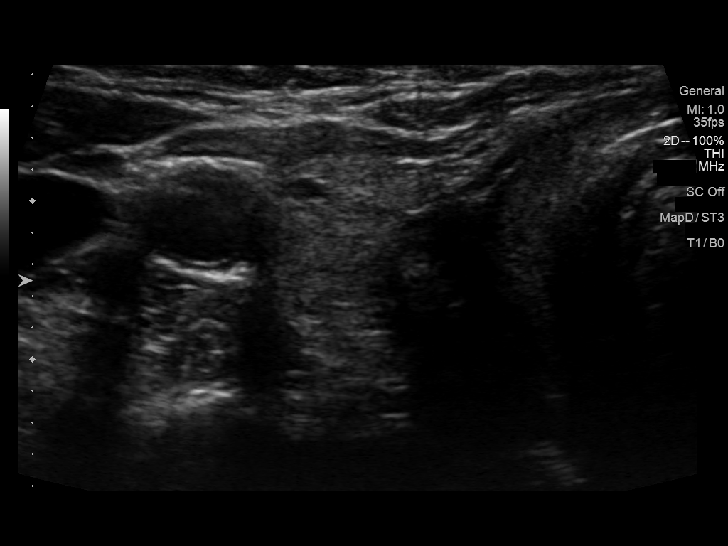
[im 24/41]
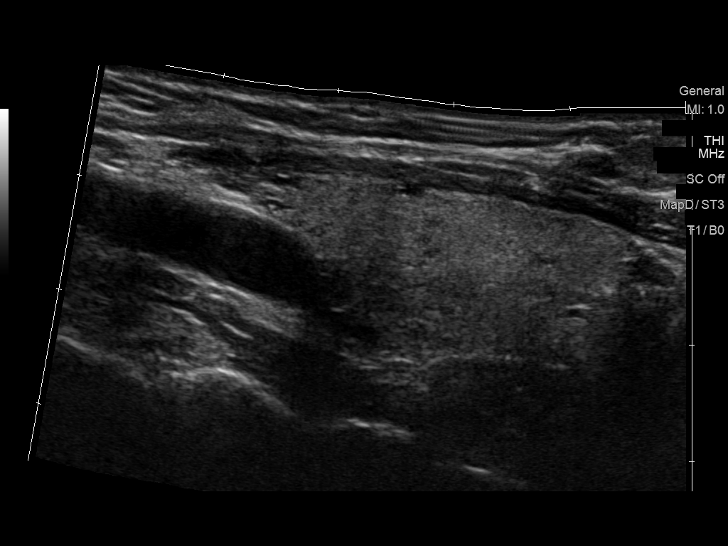
[im 27/41]
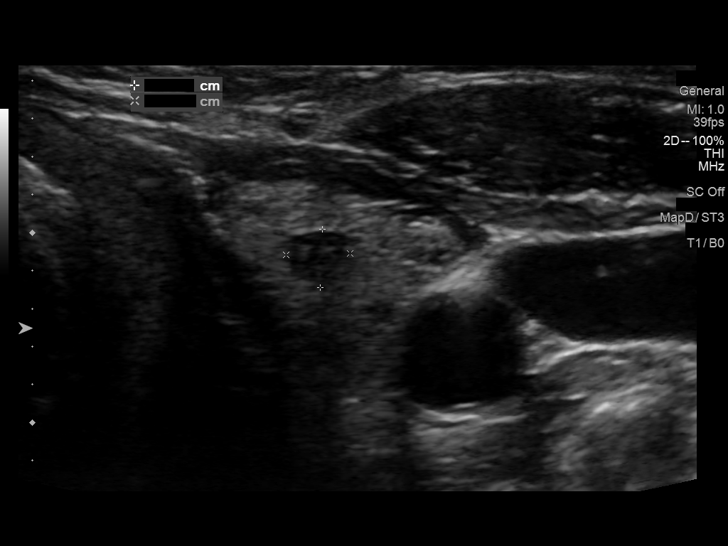
[im 31/41]
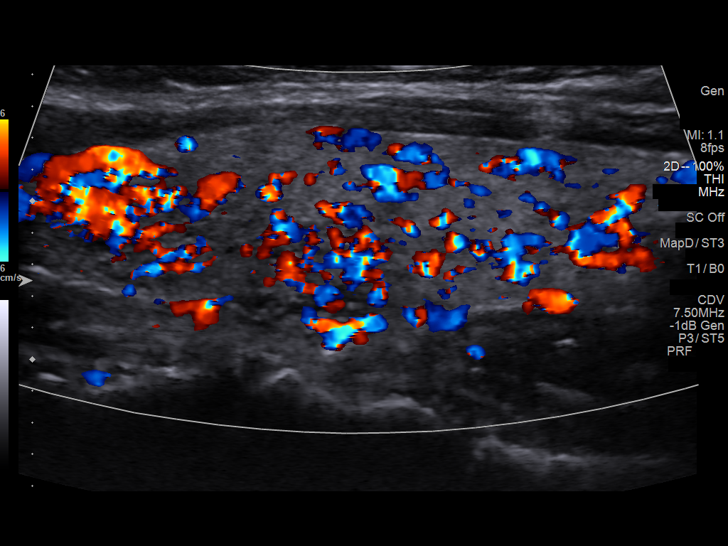
[im 34/41]
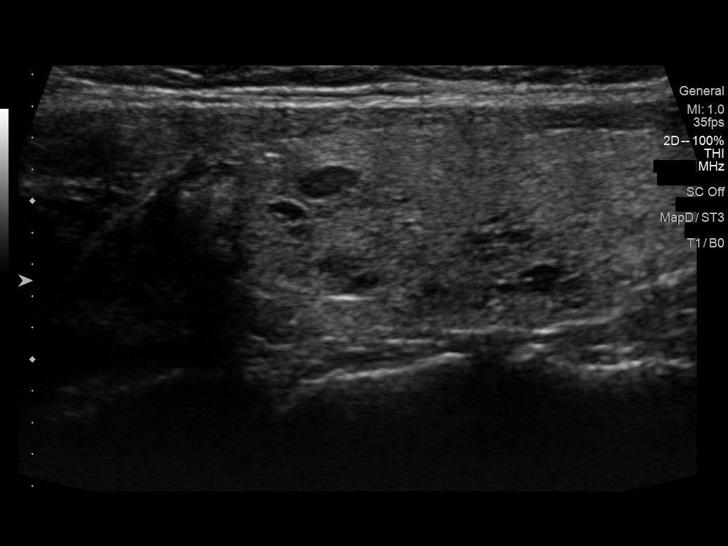
[im 37/41]
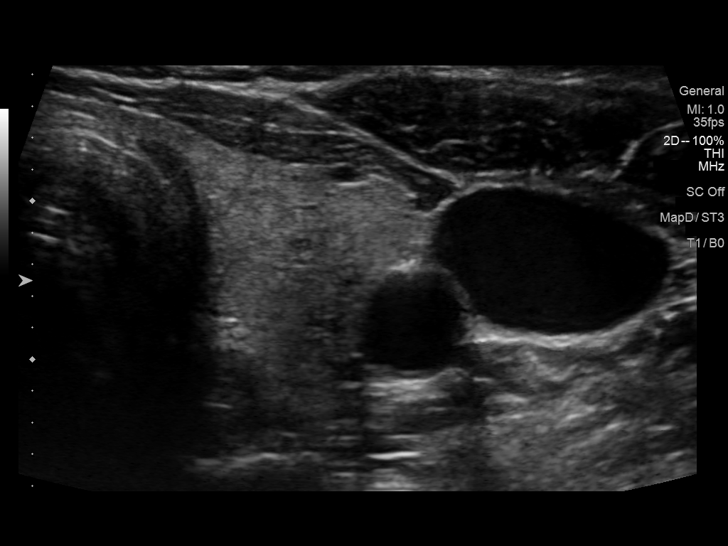
[im 41/41]
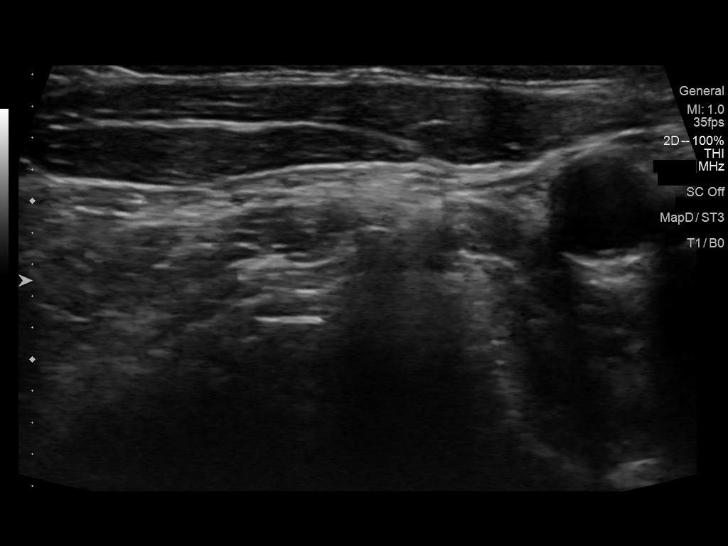

[13 of 25 positions shown; findings below may reference images not displayed]

FINDINGS: Parenchymal Echotexture: Mildly heterogenous

Isthmus: 0.2 cm

Right lobe: 5.5 cm x 1.5 cm x 1.5 cm

Left lobe: 3.3 cm x 1.3 cm x 1.4 cm

_________________________________________________________

Estimated total number of nodules >/= 1 cm: 1

Number of spongiform nodules >/=  2 cm not described below (TR1): 0

Number of mixed cystic and solid nodules >/= 1.5 cm not described
below (TR2): 0

_________________________________________________________

Nodule # 1:

Location: Right; Mid

Maximum size: 3.4 cm; Other 2 dimensions: 1.7 cm x 1.2 cm

Composition: cannot determine (2)

Echogenicity: isoechoic (1)

Shape: not taller-than-wide (0)

Margins: ill-defined (0)

Echogenic foci: none (0)

ACR TI-RADS total points: 3.

ACR TI-RADS risk category: TR3 (3 points).

ACR TI-RADS recommendations:

Nodule meets criteria for biopsy

_________________________________________________________

Nodule # 2:

Location: Left; Superior

Maximum size: 0.7 cm; Other 2 dimensions: 0.3 cm x 0.3 cm

Composition: cystic/almost completely cystic (0)

Echogenicity: anechoic (0)

Shape: not taller-than-wide (0)

Margins: smooth (0)

Echogenic foci: none (0)

ACR TI-RADS total points: 0.

ACR TI-RADS risk category: TR1 (0-1 points).

ACR TI-RADS recommendations:

Cystic nodule does not meet criteria for surveillance or biopsy

_________________________________________________________

No adenopathy
IMPRESSION: Right mid thyroid nodule (labeled 1, 3.4 cm, TR 3) meets criteria
for biopsy, as designated by the newly established ACR TI-RADS
criteria, and referral for biopsy is recommended. At the time of
biopsy, this may not be discernible as a nodule, and in fact may be
a pseudo nodule.

Recommendations follow those established by the new ACR TI-RADS
criteria ([HOSPITAL] [9L];[DATE]).

## 2020-01-11 MED ORDER — MECLIZINE HCL 25 MG PO TABS: 25.0000 mg | ORAL_TABLET | Freq: Three times a day (TID) | ORAL | 3 refills | Status: DC | PRN

## 2020-01-11 NOTE — Patient Instructions (Addendum)
Thank you for coming in today. This will be a long road but I do think it will get better.  Continue the PT exercises.  Try meclizine.  Recheck in 2 weeks.  Try dry needling with PT.  Pay attention to work load. You may be over-doing it.
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
## 2020-01-12 ENCOUNTER — Ambulatory Visit: Payer: 59

## 2020-01-12 ENCOUNTER — Other Ambulatory Visit: Payer: Self-pay

## 2020-01-12 ENCOUNTER — Telehealth: Payer: Self-pay | Admitting: Family Medicine

## 2020-01-12 DIAGNOSIS — M949 Disorder of cartilage, unspecified: Secondary | ICD-10-CM

## 2020-01-12 DIAGNOSIS — G629 Polyneuropathy, unspecified: Secondary | ICD-10-CM

## 2020-01-12 DIAGNOSIS — R2689 Other abnormalities of gait and mobility: Secondary | ICD-10-CM

## 2020-01-12 DIAGNOSIS — S060X9D Concussion with loss of consciousness of unspecified duration, subsequent encounter: Secondary | ICD-10-CM

## 2020-01-12 DIAGNOSIS — M6281 Muscle weakness (generalized): Secondary | ICD-10-CM

## 2020-01-12 DIAGNOSIS — R29898 Other symptoms and signs involving the musculoskeletal system: Secondary | ICD-10-CM

## 2020-01-12 DIAGNOSIS — M899 Disorder of bone, unspecified: Secondary | ICD-10-CM

## 2020-01-12 DIAGNOSIS — E041 Nontoxic single thyroid nodule: Secondary | ICD-10-CM

## 2020-01-12 NOTE — Telephone Encounter (Signed)
Called pt and relayed info about [place] result and fact that more labs have been ordered.  Pt verbalizes understanding and states that she will call to schedule her biopsy and labs.

## 2020-01-12 NOTE — Therapy (Signed)
[facility] [address] [place], [place], [address] Phone: [phone]   Fax:  [phone]  Physical Therapy Treatment  Patient Details  Name: Jane Hendricks MRN: [id number] Date of Birth: [birth year] Referring Provider (PT): Clementeen Graham, MD   Encounter Date: 01/12/2020  PT End of Session - 01/12/20 1703    Visit Number  7    Number of Visits  33    Date for PT Re-Evaluation  03/21/20    Authorization Type  [employer] - $40 copay - VL: 60 (PT/OT/ST combined)    Authorization - Visit Number  7    Authorization - Number of Visits  60    PT Start Time  1700    PT Stop Time  1745    PT Time Calculation (min)  45 min    Equipment Utilized During Treatment  Gait belt    Activity Tolerance  Patient tolerated treatment well    Behavior During Therapy  WFL for tasks assessed/performed       Past Medical History:  Diagnosis Date  . Bone spur of other site    C5-C6  . Herniated disc, cervical   . Kidney stones     Past Surgical History:  Procedure Laterality Date  . ANKLE SURGERY    . DILATION AND CURETTAGE OF UTERUS    . WISDOM TOOTH EXTRACTION      There were no vitals filed for this visit.  Subjective Assessment - 01/12/20 1700    Subjective  Pt reports that she feels like she has been getting worse. She feels like dizziness is about the same but pain in neck and unsteadiness is worse. Pt saw her doctor yesterday due to this and they will follow-up again in 2 weeks. They did prescribe meclizine but she has not picked up yet.    Patient is accompained by:  Family member   both parents   Pertinent History  PMH: hx of herniated disc in cervical spine    Diagnostic tests  MRI 2/13 cervical spine: No marrow edema to suggest acute cervical spine fracture. Cervical spondylosis as outlined. No more than mild spinal canalstenosis at any level. Multilevel neural foraminal narrowinggreatest on the left at C4-C5 and bilaterally  at C5-C6(moderate/advanced at these sites).MRI brain: Normal MRI appearance of the brain for age. No evidence of acuteintracranial abnormality    Patient Stated Goals  be able to walk and drive again to be more independent; to play golf and cornhole again    Currently in Pain?  Yes    Pain Score  6     Pain Location  Neck    Pain Orientation  Left    Pain Descriptors / Indicators  Aching;Sore;Stabbing    Pain Type  Acute pain    Aggravating Factors   turning neck makes worse and does get some headaches as well                       [facility] Adult PT Treatment/Exercise - 01/12/20 1704      Neuro Re-ed    Neuro Re-ed Details   In // bars: gait with 1 UE support 6' x 4, marching gait with bilateral fingertip support 6' x 4 then with only 1 UE support 6' x 4. Alternating toe taps on 4" step x 10 with 1 UE support then lateral step-ups x 5 each leg. Standing on airex with feet apart without UE support 30 sec x 2. Min assist  initially then 1 min without UE support supervision when distracted patient so she wasn't thinking so much about balancing. Marching on airex x 10 bilateral legs with finger tip support and cues to go slow and controlled. Pt has slow cadence and decreased step length with  activities with less UE support. CGA throughout.      Manual Therapy   Manual Therapy  Joint mobilization;Soft tissue mobilization    Manual therapy comments  PT performed STM to left upper traps x 1 min, cervical manual traction 20 sec x 3, suboccipital release 30 sec x 2. Pt reported feeling good with suboccipital release. Grade 3 lateral glides at C3/4 level at end range right cervical rotation on left 4 bouts 20 sec, grade 3 lateral glides at end range left cervical rotation on right at c3/4 2 bouts 20 sec. Pt hypomobile on left.    Joint Mobilization  Pt reports 5/10 neck pain after. Still pain with rotation actively on left.             PT Education - 01/12/20 2106    Education  Details  Pt to continue with current HEP    Person(s) Educated  Patient    Methods  Explanation    Comprehension  Verbalized understanding       PT Short Term Goals - 12/30/19 1257      PT SHORT TERM GOAL #1   Title  Pt will be independent with initial HEP in order to build upon functional gains made in therapy. ALL STGS DUE 02/03/20    Time  6   due to delay in scheduling   Period  Weeks    Status  New    Target Date  02/03/20      PT SHORT TERM GOAL #2   Title  Pt will undergo further vestibular assessment - goals to be written as appropriate.    Time  6    Period  Weeks    Status  Achieved      PT SHORT TERM GOAL #3   Title  Pt will undergo assessment of TUG to determine fall risk  - goal to be written as appropriate.    Time  6    Period  Weeks    Status  Achieved      PT SHORT TERM GOAL #4   Title  Pt will improve gait speed with RW to at least 2.3 ft/sec in order to decr fall risk and improve community mobility.    Baseline  1.78 ft/sec with RW.    Time  6    Period  Weeks    Status  New      PT SHORT TERM GOAL #5   Title  Pt will improve B cervical rotation AROM to at least 40 degrees with mild pain in order to improve functional mobility.    Time  6    Period  Weeks    Status  New        PT Long Term Goals - 12/30/19 1258      PT LONG TERM GOAL #1   Title  Pt will be independent with final HEP in order to build upon functional gains made in therapy. ALL LTGS DUE 03/21/20    Time  12    Period  Weeks    Status  New      PT LONG TERM GOAL #2   Title  Pt will ambulate at least 300' with LRAD with supervision over level  and unlevel surfaces in order to improve community mobility.    Time  12    Period  Weeks    Status  New      PT LONG TERM GOAL #3   Title  Pt will improve BERG score to at least a 30/56 in order to determine decr fall risk.    Baseline  14/56 on 12/23/19    Time  12    Period  Weeks    Status  New      PT LONG TERM GOAL #4   Title   Pt will improve B cervical rotation AROM to 65 degrees for functional ROM for driving.    Baseline  R rotation 43 degrees, L rotation 25 degrees    Time  12    Period  Weeks    Status  New      PT LONG TERM GOAL #5   Title  Pt will decrease TUG time to </= 13.5 seconds without use of AD    Baseline  20.22 seconds with RW    Time  12    Period  Weeks    Status  Revised      PT LONG TERM GOAL #6   Title  Pt will improve gait speed with LRAD to at least 2.7 ft/sec in order to decr fall risk and improve community mobility.    Time  12    Period  Weeks    Status  New      PT LONG TERM GOAL #7   Title  Pt will demonstrate decreased motion sensitivity as indicated by ability to perform bending down to the floor, performing head turns/nods and body turns without any dizziness; pt will also improve use of vestibular system as indicated by ability to maintain 30 seconds on each condition of the MCTSIB.    Baseline  lightheaded-mild dizziness on MSQ; 4 seconds on conditions 2,3,4; 30 seconds on condition 1 but with increased use of ankle strategy    Time  12    Period  Weeks    Status  New      PT LONG TERM GOAL #8   Title  Pt will demonstrate ability to stand without support x 10 minutes to play corn hole and demonstrate ability to hit whiffle golf balls in grass x 10 reps with supervision    Time  12    Period  Weeks    Status  New            Plan - 01/12/20 2106    Clinical Impression Statement  Pt had no increase in neck pain during session today. Only slight improvement after manual technique. Pt denied any dizziness with activities. PT focused more on gait activities in // bars decreasing UE support. Pt slows down and decreases step length with less support. Did better with encouragement. With static balance on airex pt was more stable when PT distracted her.    Personal Factors and Comorbidities  Past/Current Experience;Profession    Examination-Activity Limitations   Bend;Carry;Dressing;Locomotion Level;Squat;Stairs;Lift;Stand;Transfers;Hygiene/Grooming;Bathing    Examination-Participation Restrictions  Cleaning;Community Activity;Driving;Shop;Laundry    Stability/Clinical Decision Making  Evolving/Moderate complexity    Rehab Potential  Good    PT Frequency  Other (comment)   3x/week x 8, 2x/week x 4   PT Duration  12 weeks    PT Treatment/Interventions  ADLs/Self Care Home Management;Canalith Repostioning;DME Instruction;Gait training;Stair training;Functional mobility training;Neuromuscular re-education;Balance training;Therapeutic exercise;Therapeutic activities;Patient/family education;Dry needling;Passive range of motion;Energy conservation;Vestibular;Visual/perceptual remediation/compensation  PT Next Visit Plan  Progress corner balance-static with no UE support, narrow BOS. Find ways to distract patient from focusing too much on balance as does better when distracted. Continue to progress balance in // bars with decr UE support. gentle stretches/ROM for cervical spine for HEP; balance on compliant surfaces, vision removed.  Gait with decreased UE support.  Wants to be able to play golf and cornhole    Consulted and Agree with Plan of Care  Patient;Family member/caregiver       Patient will benefit from skilled therapeutic intervention in order to improve the following deficits and impairments:  Abnormal gait, Decreased activity tolerance, Decreased balance, Decreased coordination, Difficulty walking, Decreased strength, Dizziness, Hypomobility, Increased muscle spasms, Pain, Decreased mobility  Visit Diagnosis: Other abnormalities of gait and mobility  Muscle weakness (generalized)     Problem List Patient Active Problem List   Diagnosis Date Noted  . Concussion with loss of consciousness 01/03/2020  . Thyroid nodule 01/03/2020  . Irregular menses 04/28/2019  . Neuropathy 04/28/2019  . Infertility counseling 10/01/2017  . DDD  (degenerative disc disease), cervical 11/29/2016  . Hypoglycemia 11/29/2016  . Kidney stone 11/29/2016  . Sinusitis 11/29/2016  . Insomnia 03/08/2016  . Neck pain 03/08/2016  . 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 [place], [place], [address] Phone: [phone]   Fax:  [phone]  Name: Jane Hendricks MRN: [id number] Date of Birth: 1981-02-04

## 2020-01-12 NOTE — Progress Notes (Signed)
Radiologist sees the nodules on ultrasound.  They want to biopsy them.  I have ordered a ultrasound-guided thyroid biopsy per radiology. Phone number to call for radiology scheduling is  [phone] Additionally have ordered labs.  Like we discussed yesterday order to [place] her labs to really characterize your thyroid as well as look for other potential causes of leg coordination problems.  You can call to schedule a lab appointment at my office at [phone]

## 2020-01-12 NOTE — Telephone Encounter (Signed)
Thyroid biopsy ordered as per radiology recommendation.  You should hear about the same. I additionally ordered labs to be done in the near future.  You can call my office and schedule an appointment for labs anytime is convenient.  Phone number is [phone]

## 2020-01-13 ENCOUNTER — Other Ambulatory Visit (INDEPENDENT_AMBULATORY_CARE_PROVIDER_SITE_OTHER): Payer: Self-pay

## 2020-01-13 DIAGNOSIS — R29898 Other symptoms and signs involving the musculoskeletal system: Secondary | ICD-10-CM

## 2020-01-13 DIAGNOSIS — G629 Polyneuropathy, unspecified: Secondary | ICD-10-CM

## 2020-01-13 DIAGNOSIS — M949 Disorder of cartilage, unspecified: Secondary | ICD-10-CM

## 2020-01-13 DIAGNOSIS — E041 Nontoxic single thyroid nodule: Secondary | ICD-10-CM

## 2020-01-13 DIAGNOSIS — S060X9D Concussion with loss of consciousness of unspecified duration, subsequent encounter: Secondary | ICD-10-CM

## 2020-01-13 DIAGNOSIS — M899 Disorder of bone, unspecified: Secondary | ICD-10-CM

## 2020-01-13 LAB — CBC
HCT: 30.6 % — ABNORMAL LOW (ref 36.0–46.0)
Hemoglobin: 10.5 g/dL — ABNORMAL LOW (ref 12.0–15.0)
MCHC: 34.2 g/dL (ref 30.0–36.0)
MCV: 107.9 fl — ABNORMAL HIGH (ref 78.0–100.0)
Platelets: 255 10*3/uL (ref 150.0–400.0)
RBC: 2.84 Mil/uL — ABNORMAL LOW (ref 3.87–5.11)
RDW: 14.8 % (ref 11.5–15.5)
WBC: 3.4 10*3/uL — ABNORMAL LOW (ref 4.0–10.5)

## 2020-01-13 LAB — FOLATE: Folate: 2.5 ng/mL — ABNORMAL LOW (ref >5.9)

## 2020-01-13 LAB — TSH: TSH: 1.48 u[IU]/mL (ref 0.35–4.50)

## 2020-01-13 LAB — T3, FREE: T3, Free: 3.7 pg/mL (ref 2.3–4.2)

## 2020-01-13 LAB — VITAMIN B12: Vitamin B-12: 130 pg/mL — ABNORMAL LOW (ref 211–911)

## 2020-01-13 LAB — SEDIMENTATION RATE: Sed Rate: 12 mm/hr (ref 0–20)

## 2020-01-13 LAB — VITAMIN D 25 HYDROXY (VIT D DEFICIENCY, FRACTURES): VITD: 19.19 ng/mL — ABNORMAL LOW (ref 30.00–100.00)

## 2020-01-13 LAB — T4, FREE: Free T4: 0.83 ng/dL (ref 0.60–1.60)

## 2020-01-14 ENCOUNTER — Other Ambulatory Visit: Payer: Self-pay

## 2020-01-14 ENCOUNTER — Telehealth: Payer: Self-pay | Admitting: Family Medicine

## 2020-01-14 ENCOUNTER — Ambulatory Visit: Payer: 59 | Admitting: Physical Therapy

## 2020-01-14 DIAGNOSIS — R42 Dizziness and giddiness: Secondary | ICD-10-CM

## 2020-01-14 DIAGNOSIS — R2689 Other abnormalities of gait and mobility: Secondary | ICD-10-CM

## 2020-01-14 DIAGNOSIS — M6281 Muscle weakness (generalized): Secondary | ICD-10-CM

## 2020-01-14 DIAGNOSIS — R2681 Unsteadiness on feet: Secondary | ICD-10-CM

## 2020-01-14 DIAGNOSIS — R260 Ataxic gait: Secondary | ICD-10-CM

## 2020-01-14 DIAGNOSIS — R29818 Other symptoms and signs involving the nervous system: Secondary | ICD-10-CM

## 2020-01-14 NOTE — Telephone Encounter (Signed)
-----   Message from Christoper Fabian, LAT sent at 01/14/2020 11:27 AM EST ----- Called pt and relayed all her lab results and reviewed Dr. Zollie Pee result note.  She verbalizes understanding.  She asks if she needs to do anything specifically for the folate deficiency.

## 2020-01-14 NOTE — Telephone Encounter (Signed)
Returned pt's call and relayed Dr.Corey's comments.  Pt verbalizes understanding and states that she will see [place] on the 23 rd for a concussion f/u and for her B12 injection.

## 2020-01-14 NOTE — Telephone Encounter (Signed)
B complex multivitamins contain folic acid which should help. As for other questions about Hashimoto's.  We are doing lab testing that should be helpful for that.  We will answer more questions in the near future.

## 2020-01-14 NOTE — Progress Notes (Signed)
Labs show significant B12 deficiency.  This can cause problems with the nerves that you are experiencing potentially as well as anemia that were seen as well.  Additionally labs show folate deficiency.  Please schedule visit soon to get a B12 shot in clinic and start taking over-the-counter B complex vitamins.  Vitamin D is also quite low please start taking 5000 units of vitamin D 3 over-the-counter. Initial thyroid tests are normal.Other labs are still pending.

## 2020-01-14 NOTE — Telephone Encounter (Signed)
I called Jane Hendricks this morning to discuss her recent labs and the letter she sent me.  Will discuss further soon.  Recommend nurse visit for B12 shot.

## 2020-01-16 NOTE — Therapy (Addendum)
[facility] [address] [place], [place], [address] Phone: [phone]   Fax:  [phone]  Physical Therapy Treatment  Patient Details  Name: Jane Hendricks MRN: [id number] Date of Birth: 18-Dec-1980 Referring Provider (PT): Clementeen Graham, MD   Encounter Date: 01/14/2020  PT End of Session - 01/16/20 1702    Visit Number  8    Number of Visits  33    Date for PT Re-Evaluation  03/21/20    Authorization Type  [employer] - $40 copay - VL: 60 (PT/OT/ST combined)    Authorization - Visit Number  8    Authorization - Number of Visits  60    PT Start Time  1230    PT Stop Time  1314    PT Time Calculation (min)  44 min    Equipment Utilized During Treatment  Gait belt    Activity Tolerance  Patient tolerated treatment well    Behavior During Therapy  WFL for tasks assessed/performed       Past Medical History:  Diagnosis Date  . Bone spur of other site    C5-C6  . Herniated disc, cervical   . Kidney stones     Past Surgical History:  Procedure Laterality Date  . ANKLE SURGERY    . DILATION AND CURETTAGE OF UTERUS    . WISDOM TOOTH EXTRACTION      There were no vitals filed for this visit.        01/14/20 1234  Symptoms/Limitations  Subjective Feels like she has gone backwards. Had some bloodwork and tests done, still waiting to hear back from some. Has had more falls, feels like she is moving slowly. Trying to take less of the hydrocodone. Was a little bit sore after last session.  Patient is accompained by: Family member (both parents)  Pertinent History PMH: hx of herniated disc in cervical spine  Diagnostic tests MRI 2/13 cervical spine: No marrow edema to suggest acute cervical spine fracture. Cervical spondylosis as outlined. No more than mild spinal canalstenosis at any level. Multilevel neural foraminal narrowinggreatest on the left at C4-C5 and bilaterally at C5-C6(moderate/advanced at these sites).MRI  brain: Normal MRI appearance of the brain for age. No evidence of acuteintracranial abnormality  Patient Stated Goals be able to walk and drive again to be more independent; to play golf and cornhole again  Pain Assessment  Currently in Pain? Yes  Pain Score 3  Pain Location Neck  Pain Orientation Left  Pain Descriptors / Indicators Aching;Sore;Stabbing                 [facility] Adult PT Treatment/Exercise - 01/16/20 0001      Self-Care   Self-Care  Other Self-Care Comments    Other Self-Care Comments   Discussed potentially participating in aquatic therapy (will need to hear back from pt's insurance) to address balance, coordination, in order to decr risk of falls. Pt states she is very interested in aquatic therapy and was going to mention at session today. Therapist stating she will ask to hear about insurance verification and put in referral for pt. Also discussed with pt performing meditation/diaphragmatic breathing before bed/at work during a stressful day in order to calm the nervous system down due to pt having increased stress. Showed pt guided meditation videos on YouTube (either 5 or 10 minutes) for pt to try before bed. Also taught pt diaphraghmatic breathing in through the nose for a count of 2 and out through the nose  for a count of 4 - pt able to demo proper understanding. Discussed performing when pt has a break at work on a stressful day or before she tries to go to sleep, pt verbalized understanding.      Neuro Re-ed    Neuro Re-ed Details   Standing in / /bars: beginning with BUE support forwards walking (x1 rep) progressing to single UE (x1) support to single hand fingertip (x2) and then no UE support (x2), needing min guard with no UE support and pt able to perform more slowly with no LOB - pt with better balance when conversing. Performed retro gait x5 reps in // bars, progressing from BUE support to single UE support.      Exercises   Exercises  Other Exercises     Other Exercises   NuStep with BLE and BUE at gear 1 for 6 minutes for activity tolerance, ROM, and aerobic activity. Pt reporting no pain during or after performing.           Balance Exercises - 01/16/20 2049      Balance Exercises: Standing   Rockerboard  Anterior/posterior;EO;UE support;Limitations    Rockerboard Limitations  on rockerboard A/P weight shifting slowly x10 reps with BUE support progressing to lighter grip on the bars, pt with incr difficulty with shifting weight onto toes    Other Standing Exercises  performed standing on rockerboard with no UE support keeping board still, multiple reps, pt able to hold at most for 15-20 seconds until pt lost balance posteriorly needing BUE support and min A from therapist        PT Education - 01/16/20 1701    Education Details  discussed aquatic therapy - therapist to reach out to ask if pt's insurance will cover it, see self-care    Person(s) Educated  Patient    Methods  Explanation    Comprehension  Verbalized understanding       PT Short Term Goals - 12/30/19 1257      PT SHORT TERM GOAL #1   Title  Pt will be independent with initial HEP in order to build upon functional gains made in therapy. ALL STGS DUE 02/03/20    Time  6   due to delay in scheduling   Period  Weeks    Status  New    Target Date  02/03/20      PT SHORT TERM GOAL #2   Title  Pt will undergo further vestibular assessment - goals to be written as appropriate.    Time  6    Period  Weeks    Status  Achieved      PT SHORT TERM GOAL #3   Title  Pt will undergo assessment of TUG to determine fall risk  - goal to be written as appropriate.    Time  6    Period  Weeks    Status  Achieved      PT SHORT TERM GOAL #4   Title  Pt will improve gait speed with RW to at least 2.3 ft/sec in order to decr fall risk and improve community mobility.    Baseline  1.78 ft/sec with RW.    Time  6    Period  Weeks    Status  New      PT SHORT TERM GOAL #5    Title  Pt will improve B cervical rotation AROM to at least 40 degrees with mild pain in order to improve functional mobility.  Time  6    Period  Weeks    Status  New        PT Long Term Goals - 12/30/19 1258      PT LONG TERM GOAL #1   Title  Pt will be independent with final HEP in order to build upon functional gains made in therapy. ALL LTGS DUE 03/21/20    Time  12    Period  Weeks    Status  New      PT LONG TERM GOAL #2   Title  Pt will ambulate at least 300' with LRAD with supervision over level and unlevel surfaces in order to improve community mobility.    Time  12    Period  Weeks    Status  New      PT LONG TERM GOAL #3   Title  Pt will improve BERG score to at least a 30/56 in order to determine decr fall risk.    Baseline  14/56 on 12/23/19    Time  12    Period  Weeks    Status  New      PT LONG TERM GOAL #4   Title  Pt will improve B cervical rotation AROM to 65 degrees for functional ROM for driving.    Baseline  R rotation 43 degrees, L rotation 25 degrees    Time  12    Period  Weeks    Status  New      PT LONG TERM GOAL #5   Title  Pt will decrease TUG time to </= 13.5 seconds without use of AD    Baseline  20.22 seconds with RW    Time  12    Period  Weeks    Status  Revised      PT LONG TERM GOAL #6   Title  Pt will improve gait speed with LRAD to at least 2.7 ft/sec in order to decr fall risk and improve community mobility.    Time  12    Period  Weeks    Status  New      PT LONG TERM GOAL #7   Title  Pt will demonstrate decreased motion sensitivity as indicated by ability to perform bending down to the floor, performing head turns/nods and body turns without any dizziness; pt will also improve use of vestibular system as indicated by ability to maintain 30 seconds on each condition of the MCTSIB.    Baseline  lightheaded-mild dizziness on MSQ; 4 seconds on conditions 2,3,4; 30 seconds on condition 1 but with increased use of ankle strategy     Time  12    Period  Weeks    Status  New      PT LONG TERM GOAL #8   Title  Pt will demonstrate ability to stand without support x 10 minutes to play corn hole and demonstrate ability to hit whiffle golf balls in grass x 10 reps with supervision    Time  12    Period  Weeks    Status  New            Plan - 01/16/20 2051    Clinical Impression Statement  Pt with no reports of incr in neck pain throughout session today. Pt able to ambulate small distances in // bars with no UE support with min guard from therapist and no LOB - however, slowed her cadence and had incr difficulty with foot placement at times. Pt able  to maintain balance on rocker board with no UE support for at most 15 seconds today before losing balance posteriorly. Pt does best when conversing during balance activities to distract from task of balancing. Will continue to progress towards LTGs.    Personal Factors and Comorbidities  Past/Current Experience;Profession    Examination-Activity Limitations  Bend;Carry;Dressing;Locomotion Level;Squat;Stairs;Lift;Stand;Transfers;Hygiene/Grooming;Bathing    Examination-Participation Restrictions  Cleaning;Community Activity;Driving;Shop;Laundry    Stability/Clinical Decision Making  Evolving/Moderate complexity    Rehab Potential  Good    PT Frequency  Other (comment)   3x/week x 8, 2x/week x 4   PT Duration  12 weeks    PT Treatment/Interventions  ADLs/Self Care Home Management;Canalith Repostioning;DME Instruction;Gait training;Stair training;Functional mobility training;Neuromuscular re-education;Balance training;Therapeutic exercise;Therapeutic activities;Patient/family education;Dry needling;Passive range of motion;Energy conservation;Vestibular;Visual/perceptual remediation/compensation    PT Next Visit Plan  any updates from [facility] about aquatic therapy? Progress corner balance-static with no UE support, narrow BOS. Find ways to distract patient from focusing too much on  balance as does better when distracted. Continue to progress balance in // bars with decr UE support. gentle stretches/ROM for cervical spine for HEP; eyes closed balance.  Wants to be able to play golf and cornhole    Consulted and Agree with Plan of Care  Patient;Family member/caregiver       Patient will benefit from skilled therapeutic intervention in order to improve the following deficits and impairments:  Abnormal gait, Decreased activity tolerance, Decreased balance, Decreased coordination, Difficulty walking, Decreased strength, Dizziness, Hypomobility, Increased muscle spasms, Pain, Decreased mobility  Visit Diagnosis: Other abnormalities of gait and mobility  Muscle weakness (generalized)  Ataxic gait  Other symptoms and signs involving the nervous system  Dizziness and giddiness  Unsteadiness on feet     Problem List Patient Active Problem List   Diagnosis Date Noted  . Concussion with loss of consciousness 01/03/2020  . Thyroid nodule 01/03/2020  . Irregular menses 04/28/2019  . Neuropathy 04/28/2019  . Infertility counseling 10/01/2017  . DDD (degenerative disc disease), cervical 11/29/2016  . Hypoglycemia 11/29/2016  . Kidney stone 11/29/2016  . Sinusitis 11/29/2016  . Insomnia 03/08/2016  . Neck pain 03/08/2016  . 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

## 2020-01-17 ENCOUNTER — Ambulatory Visit: Payer: 59

## 2020-01-17 ENCOUNTER — Other Ambulatory Visit: Payer: Self-pay

## 2020-01-17 ENCOUNTER — Telehealth: Payer: Self-pay

## 2020-01-17 DIAGNOSIS — M6281 Muscle weakness (generalized): Secondary | ICD-10-CM

## 2020-01-17 DIAGNOSIS — R2689 Other abnormalities of gait and mobility: Secondary | ICD-10-CM

## 2020-01-17 NOTE — Telephone Encounter (Signed)
Family friend calling on behalf of patient on 01/16/2020 2:36:03 PM. Spoke with team health and states patient is having body trimmers that look like seizures. Caller states she has had a change in behaviour since.patient fell and hit her head on Friday. She is having tremors that last about . Caller is a family friend. Patient fell trying to go from the bathroom to the bed.  Advised to go to ED now.  Caller called 911 and EMS was on their way.  Dr Denyse Amass patient.

## 2020-01-17 NOTE — Therapy (Signed)
[facility] [address] [place], [place], [address] Phone: [phone]   Fax:  [phone]  Physical Therapy Treatment  Patient Details  Name: Jane Hendricks MRN: [id number] Date of Birth: May 24, 1981 Referring Provider (PT): Clementeen Graham, MD   Encounter Date: 01/17/2020  PT End of Session - 01/17/20 1241    Visit Number  9    Number of Visits  33    Date for PT Re-Evaluation  03/21/20    Authorization Type  [employer] - $40 copay - VL: 60 (PT/OT/ST combined)    Authorization - Visit Number  9    Authorization - Number of Visits  60    PT Start Time  1230    PT Stop Time  1315    PT Time Calculation (min)  45 min    Equipment Utilized During Treatment  Gait belt    Activity Tolerance  Patient tolerated treatment well    Behavior During Therapy  WFL for tasks assessed/performed       Past Medical History:  Diagnosis Date  . Bone spur of other site    C5-C6  . Herniated disc, cervical   . Kidney stones     Past Surgical History:  Procedure Laterality Date  . ANKLE SURGERY    . DILATION AND CURETTAGE OF UTERUS    . WISDOM TOOTH EXTRACTION      There were no vitals filed for this visit.  Subjective Assessment - 01/17/20 1235    Subjective  Pt reports that she had a fall on Friday night and hit her head. She reports that she started having tremors after that. Husband describes as a bouncing in her legs. They tried to call doctor office and no one was on call and she was advised to call EMS. They said they would take her to the ER but no guarantees that she would get right in so decided to stay home since vitals were good. Pt reports that she has had more of a headache. Feels more sleepy since then. She has also started taking the meclizine.    Patient is accompained by:  Family member   both parents   Pertinent History  PMH: hx of herniated disc in cervical spine    Diagnostic tests  MRI 2/13 cervical spine: No  marrow edema to suggest acute cervical spine fracture. Cervical spondylosis as outlined. No more than mild spinal canalstenosis at any level. Multilevel neural foraminal narrowinggreatest on the left at C4-C5 and bilaterally at C5-C6(moderate/advanced at these sites).MRI brain: Normal MRI appearance of the brain for age. No evidence of acuteintracranial abnormality    Patient Stated Goals  be able to walk and drive again to be more independent; to play golf and cornhole again    Currently in Pain?  Yes    Pain Score  6     Pain Location  Neck    Pain Orientation  Left    Pain Descriptors / Indicators  Stabbing    Pain Type  Acute pain                       [facility] Adult PT Treatment/Exercise - 01/17/20 1241      Ambulation/Gait   Ambulation/Gait  Yes    Ambulation/Gait Assistance  5: Supervision;4: Min guard    Ambulation/Gait Assistance Details  Pt was cued to try to spread feet out a bit. Did not some ataxic steps at first. When instructed to turn and  go to // bars pt had partial buckling at knees with difficulty initiating step.    Ambulation Distance (Feet)  230 Feet    Assistive device  Rolling walker    Gait Pattern  Step-through pattern;Decreased step length - right;Decreased step length - left;Narrow base of support    Ambulation Surface  Level;Indoor      Therapeutic Activites    Therapeutic Activities  Other Therapeutic Activities    Other Therapeutic Activities  PT grossly assessed BLE strength and 4+/5 knee flex/ext and hip flexion. Peripheral vision intact. Ocular movements intact with no issues with smooth pursuit.      Neuro Re-ed    Neuro Re-ed Details   In // bars: walking in // bars with fingertip support 6' x 4, gait with 1 UE support 6' x 2, gait without UE support 6' x 2. Tapping 3 cones with 1 UE support x 5 each leg then alternating tapping single cone x 5 with 1 UE support then no UE support x 5. Standing on rockerboard maintaining level x 30 sec then  rocking ant/post x 10 with 1 UE support x 10 then with no UE support x 5. Reciprocal steps over 3 hurdles in // bars with bilateral fingertip support x 2 then 1 UE support x 2 then no UE support x 4.  Pt was given verbal cues to tighten gluts when shifting over in to SLS with activities. CGA for safety. Pt unsteady when decreased UE support but did improve on consecutive reps.             PT Education - 01/17/20 1606    Education Details  Pt to continue with current HEP    Person(s) Educated  Patient    Methods  Explanation    Comprehension  Verbalized understanding       PT Short Term Goals - 12/30/19 1257      PT SHORT TERM GOAL #1   Title  Pt will be independent with initial HEP in order to build upon functional gains made in therapy. ALL STGS DUE 02/03/20    Time  6   due to delay in scheduling   Period  Weeks    Status  New    Target Date  02/03/20      PT SHORT TERM GOAL #2   Title  Pt will undergo further vestibular assessment - goals to be written as appropriate.    Time  6    Period  Weeks    Status  Achieved      PT SHORT TERM GOAL #3   Title  Pt will undergo assessment of TUG to determine fall risk  - goal to be written as appropriate.    Time  6    Period  Weeks    Status  Achieved      PT SHORT TERM GOAL #4   Title  Pt will improve gait speed with RW to at least 2.3 ft/sec in order to decr fall risk and improve community mobility.    Baseline  1.78 ft/sec with RW.    Time  6    Period  Weeks    Status  New      PT SHORT TERM GOAL #5   Title  Pt will improve B cervical rotation AROM to at least 40 degrees with mild pain in order to improve functional mobility.    Time  6    Period  Weeks    Status  New  PT Long Term Goals - 12/30/19 1258      PT LONG TERM GOAL #1   Title  Pt will be independent with final HEP in order to build upon functional gains made in therapy. ALL LTGS DUE 03/21/20    Time  12    Period  Weeks    Status  New      PT  LONG TERM GOAL #2   Title  Pt will ambulate at least 300' with LRAD with supervision over level and unlevel surfaces in order to improve community mobility.    Time  12    Period  Weeks    Status  New      PT LONG TERM GOAL #3   Title  Pt will improve BERG score to at least a 30/56 in order to determine decr fall risk.    Baseline  14/56 on 12/23/19    Time  12    Period  Weeks    Status  New      PT LONG TERM GOAL #4   Title  Pt will improve B cervical rotation AROM to 65 degrees for functional ROM for driving.    Baseline  R rotation 43 degrees, L rotation 25 degrees    Time  12    Period  Weeks    Status  New      PT LONG TERM GOAL #5   Title  Pt will decrease TUG time to </= 13.5 seconds without use of AD    Baseline  20.22 seconds with RW    Time  12    Period  Weeks    Status  Revised      PT LONG TERM GOAL #6   Title  Pt will improve gait speed with LRAD to at least 2.7 ft/sec in order to decr fall risk and improve community mobility.    Time  12    Period  Weeks    Status  New      PT LONG TERM GOAL #7   Title  Pt will demonstrate decreased motion sensitivity as indicated by ability to perform bending down to the floor, performing head turns/nods and body turns without any dizziness; pt will also improve use of vestibular system as indicated by ability to maintain 30 seconds on each condition of the MCTSIB.    Baseline  lightheaded-mild dizziness on MSQ; 4 seconds on conditions 2,3,4; 30 seconds on condition 1 but with increased use of ankle strategy    Time  12    Period  Weeks    Status  New      PT LONG TERM GOAL #8   Title  Pt will demonstrate ability to stand without support x 10 minutes to play corn hole and demonstrate ability to hit whiffle golf balls in grass x 10 reps with supervision    Time  12    Period  Weeks    Status  New            Plan - 01/17/20 1608    Clinical Impression Statement  Pt had decreased stability with initial new activities  and with decreasing UE support but did improve some with practice. Able to increase time on rockerboard without UE support as well as perform more activities with SLS.    Personal Factors and Comorbidities  Past/Current Experience;Profession    Examination-Activity Limitations  Bend;Carry;Dressing;Locomotion Level;Squat;Stairs;Lift;Stand;Transfers;Hygiene/Grooming;Bathing    Examination-Participation Restrictions  Cleaning;Community Activity;Driving;Shop;Laundry    Stability/Clinical Decision Making  Evolving/Moderate  complexity    Rehab Potential  Good    PT Frequency  Other (comment)   3x/week x 8, 2x/week x 4   PT Duration  12 weeks    PT Treatment/Interventions  ADLs/Self Care Home Management;Canalith Repostioning;DME Instruction;Gait training;Stair training;Functional mobility training;Neuromuscular re-education;Balance training;Therapeutic exercise;Therapeutic activities;Patient/family education;Dry needling;Passive range of motion;Energy conservation;Vestibular;Visual/perceptual remediation/compensation    PT Next Visit Plan  any updates from gray about aquatic therapy? Progress corner balance-static with no UE support, narrow BOS. Find ways to distract patient from focusing too much on balance as does better when distracted. Continue to progress balance in // bars with decr UE support. gentle stretches/ROM for cervical spine for HEP; eyes closed balance.  Wants to be able to play golf and cornhole    Consulted and Agree with Plan of Care  Patient;Family member/caregiver       Patient will benefit from skilled therapeutic intervention in order to improve the following deficits and impairments:  Abnormal gait, Decreased activity tolerance, Decreased balance, Decreased coordination, Difficulty walking, Decreased strength, Dizziness, Hypomobility, Increased muscle spasms, Pain, Decreased mobility  Visit Diagnosis: Other abnormalities of gait and mobility  Muscle weakness  (generalized)     Problem List Patient Active Problem List   Diagnosis Date Noted  . Concussion with loss of consciousness 01/03/2020  . Thyroid nodule 01/03/2020  . Irregular menses 04/28/2019  . Neuropathy 04/28/2019  . Infertility counseling 10/01/2017  . DDD (degenerative disc disease), cervical 11/29/2016  . Hypoglycemia 11/29/2016  . Kidney stone 11/29/2016  . Sinusitis 11/29/2016  . Insomnia 03/08/2016  . Neck pain 03/08/2016  . 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

## 2020-01-17 NOTE — Telephone Encounter (Signed)
Patient called back with more information on what happened this weekend.  She said that she fell and hit her head on Friday with what she thinks is a moment of unconsciousness. After that she felt very tired, dizzy, and overall did not feel well. She had 3 "episodes" where her legs would start shaking and move up her body. So bad that she could not stand or hold anything. This happened Saturday during the day, once at night, and again on Sunday. They also cause her to have anxiety attacks and hyperventilation. She takes a clonazepam when this happens which seems to help. They called EMS. They came out and checked her vitals which were a little high for her normal. She chose to not go the hospital.  Her walking and stability has been much worse since then.  Just FYI.

## 2020-01-18 ENCOUNTER — Inpatient Hospital Stay: Admission: RE | Admit: 2020-01-18 | Payer: Self-pay | Source: Ambulatory Visit

## 2020-01-18 ENCOUNTER — Emergency Department (HOSPITAL_BASED_OUTPATIENT_CLINIC_OR_DEPARTMENT_OTHER)
Admission: EM | Admit: 2020-01-18 | Discharge: 2020-01-18 | Disposition: A | Discharge status: Home / Self Care | Payer: Self-pay | Attending: Emergency Medicine | Admitting: Emergency Medicine

## 2020-01-18 ENCOUNTER — Other Ambulatory Visit: Payer: Self-pay

## 2020-01-18 ENCOUNTER — Encounter (HOSPITAL_BASED_OUTPATIENT_CLINIC_OR_DEPARTMENT_OTHER): Payer: Self-pay

## 2020-01-18 DIAGNOSIS — F69 Unspecified disorder of adult personality and behavior: Secondary | ICD-10-CM | POA: Insufficient documentation

## 2020-01-18 DIAGNOSIS — Z0000 Encounter for general adult medical examination without abnormal findings: Secondary | ICD-10-CM | POA: Insufficient documentation

## 2020-01-18 DIAGNOSIS — Y908 Blood alcohol level of 240 mg/100 ml or more: Secondary | ICD-10-CM | POA: Insufficient documentation

## 2020-01-18 HISTORY — DX: Ataxic gait: R26.0

## 2020-01-18 LAB — CBC WITH DIFFERENTIAL/PLATELET
Abs Immature Granulocytes: 0.02 10*3/uL (ref 0.00–0.07)
Basophils Absolute: 0 10*3/uL (ref 0.0–0.1)
Basophils Relative: 1 %
Eosinophils Absolute: 0.1 10*3/uL (ref 0.0–0.5)
Eosinophils Relative: 2 %
HCT: 33.2 % — ABNORMAL LOW (ref 36.0–46.0)
Hemoglobin: 11.3 g/dL — ABNORMAL LOW (ref 12.0–15.0)
Immature Granulocytes: 1 %
Lymphocytes Relative: 50 %
Lymphs Abs: 2.3 10*3/uL (ref 0.7–4.0)
MCH: 36.7 pg — ABNORMAL HIGH (ref 26.0–34.0)
MCHC: 34 g/dL (ref 30.0–36.0)
MCV: 107.8 fL — ABNORMAL HIGH (ref 80.0–100.0)
Monocytes Absolute: 0.3 10*3/uL (ref 0.1–1.0)
Monocytes Relative: 6 %
Neutro Abs: 1.8 10*3/uL (ref 1.7–7.7)
Neutrophils Relative %: 40 %
Platelets: 233 10*3/uL (ref 150–400)
RBC: 3.08 MIL/uL — ABNORMAL LOW (ref 3.87–5.11)
RDW: 14.2 % (ref 11.5–15.5)
WBC: 4.4 10*3/uL (ref 4.0–10.5)
nRBC: 0 % (ref 0.0–0.2)

## 2020-01-18 LAB — COMPREHENSIVE METABOLIC PANEL
ALT: 20 U/L (ref 0–44)
AST: 55 U/L — ABNORMAL HIGH (ref 15–41)
Albumin: 3.5 g/dL (ref 3.5–5.0)
Alkaline Phosphatase: 91 U/L (ref 38–126)
Anion gap: 9 (ref 5–15)
BUN: 8 mg/dL (ref 6–20)
CO2: 26 mmol/L (ref 22–32)
Calcium: 8.5 mg/dL — ABNORMAL LOW (ref 8.9–10.3)
Chloride: 106 mmol/L (ref 98–111)
Creatinine, Ser: 0.61 mg/dL (ref 0.44–1.00)
GFR calc Af Amer: >60 mL/min (ref >60)
GFR calc non Af Amer: >60 mL/min (ref >60)
Glucose, Bld: 104 mg/dL — ABNORMAL HIGH (ref 70–99)
Potassium: 4.1 mmol/L (ref 3.5–5.1)
Sodium: 141 mmol/L (ref 135–145)
Total Bilirubin: 0.2 mg/dL — ABNORMAL LOW (ref 0.3–1.2)
Total Protein: 7.1 g/dL (ref 6.5–8.1)

## 2020-01-18 LAB — ETHANOL: Alcohol, Ethyl (B): 369 mg/dL (ref <10)

## 2020-01-18 LAB — RAPID URINE DRUG SCREEN, HOSP PERFORMED
Amphetamines: NOT DETECTED
Barbiturates: NOT DETECTED
Benzodiazepines: NOT DETECTED
Cocaine: NOT DETECTED
Opiates: POSITIVE — AB
Tetrahydrocannabinol: NOT DETECTED

## 2020-01-18 LAB — URINALYSIS, ROUTINE W REFLEX MICROSCOPIC
Bilirubin Urine: NEGATIVE
Glucose, UA: NEGATIVE mg/dL
Hgb urine dipstick: NEGATIVE
Ketones, ur: NEGATIVE mg/dL
Leukocytes,Ua: NEGATIVE
Nitrite: NEGATIVE
Protein, ur: NEGATIVE mg/dL
Specific Gravity, Urine: <1.005 — ABNORMAL LOW (ref 1.005–1.030)
pH: 6 (ref 5.0–8.0)

## 2020-01-18 LAB — ACETAMINOPHEN LEVEL: Acetaminophen (Tylenol), Serum: <10 ug/mL — ABNORMAL LOW (ref 10–30)

## 2020-01-18 LAB — SALICYLATE LEVEL: Salicylate Lvl: <7 mg/dL — ABNORMAL LOW (ref 7.0–30.0)

## 2020-01-18 LAB — PREGNANCY, URINE: Preg Test, Ur: NEGATIVE

## 2020-01-18 NOTE — ED Notes (Addendum)
Pt has decided to be seen as ED pt-taken to tx area via w/c-I spoke with pt's husband after he returned- he states both his mother in law and sister in law had concerns about pt today when they spoke with her on the phone while pt was at work feeling pt may have passed out at work-he went to pt's work where he was advised by a parent that stated she is a Engineer, civil (consulting) and feels pt is taking too much pain medication or having a reaction to medications-he states pt was pale-he did not advise pt he was taking her to the ED vs taking her to have a thyroid biopsy due to "she was out of it and was passed out on the way here"-he was made aware pt was appropriate with check in and into triage until she was notified she was at an ED in [place]-he was asked to speak with pt in ED WR-explain his concerns and that he intentionally brought pt to ED and to see if pt agrees to be seen in the ED

## 2020-01-18 NOTE — ED Notes (Signed)
TTS being done 

## 2020-01-18 NOTE — ED Notes (Signed)
Date and time results received: 01/18/20 1936 (use smartphrase ".now" to insert current time)  Test: alcohol Critical Value: 369  Name of Provider Notified: Hyman Hopes PA  Orders Received? Or Actions Taken?: no new orders

## 2020-01-18 NOTE — ED Triage Notes (Addendum)
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
## 2020-01-18 NOTE — ED Notes (Addendum)
Pt presents to triage via w/c-states she is here for "thyroid biopsy"-pt advised she is in the ED/[facility] in [facility] and we do not perform thyroid biopsies-pt screamed "ER"? "[place]"? "I'm suppose to be at [employer]" and  became hysterical/hyperventilating/crying-states that her husband dropped her off and left-advised pt to contact husband and facility where she thinks she is suppose to be-pt cont'd hysterical with hands shaking while holding her phone-reg clerk contacted husband via phone to return to ED

## 2020-01-18 NOTE — ED Notes (Signed)
Pt calling out  Pt requesting to go to the restroom  Pt states she uses a walker at home  Walker to bedside  Pt got up using walker and got into the hallway and began breathing rapidly saying she needed her medication  Assisted pt into a wheelchair and explained that she could not have any medication at this time due to elevated alcohol level  Assisted pt to restroom and back to her room without incident

## 2020-01-18 NOTE — ED Notes (Signed)
ED Provider at bedside. 

## 2020-01-18 NOTE — Discharge Instructions (Addendum)
Please follow up with your PCP regarding your ED visit today Call to reschedule an appointment for your thyroid biopsy
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
## 2020-01-18 NOTE — Telephone Encounter (Signed)
Noted  

## 2020-01-19 ENCOUNTER — Ambulatory Visit: Payer: 59 | Admitting: Physical Therapy

## 2020-01-19 ENCOUNTER — Telehealth (HOSPITAL_COMMUNITY): Payer: Self-pay | Admitting: Psychiatry

## 2020-01-19 ENCOUNTER — Ambulatory Visit: Payer: 59 | Admitting: Family Medicine

## 2020-01-19 ENCOUNTER — Encounter: Payer: Self-pay | Admitting: Family Medicine

## 2020-01-19 VITALS — BP 110/72 | HR 85 | Ht 64.0 in | Wt 133.0 lb

## 2020-01-19 DIAGNOSIS — F32A Depression, unspecified: Secondary | ICD-10-CM | POA: Insufficient documentation

## 2020-01-19 DIAGNOSIS — Z789 Other specified health status: Secondary | ICD-10-CM

## 2020-01-19 DIAGNOSIS — F419 Anxiety disorder, unspecified: Secondary | ICD-10-CM

## 2020-01-19 DIAGNOSIS — Z7289 Other problems related to lifestyle: Secondary | ICD-10-CM

## 2020-01-19 DIAGNOSIS — S060X9D Concussion with loss of consciousness of unspecified duration, subsequent encounter: Secondary | ICD-10-CM

## 2020-01-19 DIAGNOSIS — F329 Major depressive disorder, single episode, unspecified: Secondary | ICD-10-CM | POA: Insufficient documentation

## 2020-01-19 LAB — THYROID STIMULATING IMMUNOGLOBULIN: TSI: <89 % baseline (ref <140)

## 2020-01-19 LAB — COMPLETE METABOLIC PANEL WITH GFR
AG Ratio: 1.2 (calc) (ref 1.0–2.5)
ALT: 22 U/L (ref 6–29)
AST: 66 U/L — ABNORMAL HIGH (ref 10–30)
Albumin: 3.6 g/dL (ref 3.6–5.1)
Alkaline phosphatase (APISO): 101 U/L (ref 31–125)
BUN/Creatinine Ratio: 10 (calc) (ref 6–22)
BUN: 5 mg/dL — ABNORMAL LOW (ref 7–25)
CO2: 22 mmol/L (ref 20–32)
Calcium: 8.4 mg/dL — ABNORMAL LOW (ref 8.6–10.2)
Chloride: 103 mmol/L (ref 98–110)
Creat: 0.51 mg/dL (ref 0.50–1.10)
GFR, Est African American: 142 mL/min/{1.73_m2} (ref >=60)
GFR, Est Non African American: 123 mL/min/{1.73_m2} (ref >=60)
Globulin: 3 g/dL (calc) (ref 1.9–3.7)
Glucose, Bld: 88 mg/dL (ref 65–99)
Potassium: 4.4 mmol/L (ref 3.5–5.3)
Sodium: 137 mmol/L (ref 135–146)
Total Bilirubin: 0.3 mg/dL (ref 0.2–1.2)
Total Protein: 6.6 g/dL (ref 6.1–8.1)

## 2020-01-19 LAB — THYROID PEROXIDASE ANTIBODIES (TPO) (REFL): Thyroperoxidase Ab SerPl-aCnc: <1 IU/mL (ref <9)

## 2020-01-19 LAB — RPR: RPR Ser Ql: NONREACTIVE

## 2020-01-19 MED ORDER — BUSPIRONE HCL 5 MG PO TABS: 5.0000 mg | ORAL_TABLET | Freq: Three times a day (TID) | ORAL | 0 refills | Status: DC

## 2020-01-19 NOTE — Progress Notes (Signed)
Thyroid tests are negative

## 2020-01-19 NOTE — Telephone Encounter (Signed)
D:  Pt's husband Eliberto Ivory) called to inquire about MH-IOP for his wife.  A:  Oriented pt's husband.  Answered questions about MH-IOP.  Husband states he will discuss with his wife and have her to call back if interested.
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
## 2020-01-19 NOTE — Patient Instructions (Addendum)
Thank you for coming in today. Plan for therapy.  STOP all alcohol.  STOP all klonopin.  Use buspar as needed for anxiety.   B complex vitamins daily.   You should hear from therapy soon.   Do not work until feeling more normal.   In the medium term we need to get you established with primary care.     Suicidal Feelings: How to Help Yourself Suicide is when you end your own life. There are many things you can do to help yourself feel better when struggling with these feelings. Many services and people are available to support you and others who struggle with similar feelings.  If you ever feel like you may hurt yourself or others, or have thoughts about taking your own life, get help right away. To get help:  Call your local emergency services (911 in the [place]  [facility] [place] [address]'s health and human services helpline (211 in the [place]  Go to your nearest emergency department.  Call a suicide hotline to speak with a trained counselor. The following suicide hotlines are available in the [place]: ? 1-800-273-TALK (930)309-2488). ? 1-800-SUICIDE 770-032-0416). ? (803)415-4699. This is a hotline for Spanish speakers. ? 409-817-4879. This is a hotline for TTY users. ? 1-866-4-U-TREVOR (364)827-2468). This is a hotline for lesbian, gay, bisexual, transgender, or questioning youth. ? For a list of hotlines in [place], visit ParkingAffiliatePrograms.se.html  Contact a crisis center or a local suicide prevention center. To find a crisis center or suicide prevention center: ? Call your local hospital, clinic, community service organization, mental health center, social service provider, or health department. Ask for help with connecting to a crisis center. ? For a list of crisis centers in the [place], visit: suicidepreventionlifeline.org ? For a list of crisis centers in [place], visit: suicideprevention.ca How to help yourself feel  better   Promise yourself that you will not do anything extreme when you have suicidal feelings. Remember, there is hope. Many people have gotten through suicidal thoughts and feelings, and you can too. If you have had these feelings before, remind yourself that you can get through them again.  Let family, friends, teachers, or counselors know how you are feeling. Try not to separate yourself from those who care about you and want to help you. Talk with someone every day, even if you do not feel sociable. Face-to-face conversation is best to help them understand your feelings.  Contact a mental health care provider and work with this person regularly.  Make a safety plan that you can follow during a crisis. Include phone numbers of suicide prevention hotlines, mental health professionals, and trusted friends and family members you can call during an emergency. Save these numbers on your phone.  If you are thinking of taking a lot of medicine, give your medicine to someone who can give it to you as prescribed. If you are on antidepressants and are concerned you will overdose, tell your health care provider so that he or she can give you safer medicines.  Try to stick to your routines. Follow a schedule every day. Make self-care a priority.  Make a list of realistic goals, and cross them off when you achieve them. Accomplishments can give you a sense of worth.  Wait until you are feeling better before doing things that you find difficult or unpleasant.  Do things that you have always enjoyed to take your mind off your feelings. Try reading a book, or listening to or playing music.  Spending time outside, in nature, may help you feel better. Follow these instructions at home:   Visit your primary health care provider every year for a checkup.  Work with a mental health care provider as needed.  Eat a well-balanced diet, and eat regular meals.  Get plenty of rest.  Exercise if you are able.  Just 30 minutes of exercise each day can help you feel better.  Take over-the-counter and prescription medicines only as told by your health care provider. Ask your mental health care provider about the possible side effects of any medicines you are taking.  Do not use alcohol or drugs, and remove these substances from your home.  Remove weapons, poisons, knives, and other deadly items from your home. General recommendations  Keep your living space well lit.  When you are feeling well, write yourself a letter with tips and support that you can read when you are not feeling well.  Remember that life's difficulties can be sorted out with help. Conditions can be treated, and you can learn behaviors and ways of thinking that will help you. Where to find more information  National Suicide Prevention Lifeline: www.suicidepreventionlifeline.org  Hopeline: www.hopeline.com  [employer] for Suicide Prevention: https://www.ayers.com/  [employer] (for lesbian, gay, bisexual, transgender, or questioning youth): www.thetrevorproject.org Contact a health care provider if:  You feel as though you are a burden to others.  You feel agitated, angry, vengeful, or have extreme mood swings.  You have withdrawn from family and friends. Get help right away if:  You are talking about suicide or wishing to die.  You start making plans for how to commit suicide.  You feel that you have no reason to live.  You start making plans for putting your affairs in order, saying goodbye, or giving your possessions away.  You feel guilt, shame, or unbearable pain, and it seems like there is no way out.  You are frequently using drugs or alcohol.  You are engaging in risky behaviors that could lead to death. If you have any of these symptoms, get help right away. Call emergency services, go to your nearest emergency department or crisis center, or call a suicide crisis helpline. Summary  Suicide is  when you take your own life.  Promise yourself that you will not do anything extreme when you have suicidal feelings.  Let family, friends, teachers, or counselors know how you are feeling.  Get help right away if you feel as though life is getting too tough to handle and you are thinking about suicide. This information is not intended to replace advice given to you by your health care provider. Make sure you discuss any questions you have with your health care provider. Document Revised: 02/11/2019 Document Reviewed: 06/03/2017 Elsevier Patient Education  2020 [employer].

## 2020-01-19 NOTE — Therapy (Signed)
[facility] [address] [place], [place], [address] Phone: [phone]   Fax:  [phone]  Physical Therapy Treatment - Arrived No Charge   Patient Details  Name: Jane Hendricks MRN: [id number] Date of Birth: Aug 29, 1981 Referring Provider (PT): Lynne Leader, MD   Encounter Date: 01/19/2020  PT End of Session - 01/19/20 1525    Visit Number  9   arrived no charge   Number of Visits  33    Date for PT Re-Evaluation  03/21/20    Authorization Type  [employer] - $40 copay - VL: 60 (PT/OT/ST combined)    Authorization - Visit Number  9    Authorization - Number of Visits  60    PT Start Time  1330   pt arrived late   PT Stop Time  1400    PT Time Calculation (min)  30 min    Activity Tolerance  Other (comment)   arrived, no charge - pt emotional      Past Medical History:  Diagnosis Date  . Alcohol abuse   . Anxiety   . Ataxic gait   . Bone spur of other site    C5-C6  . Herniated disc, cervical   . Kidney stones     Past Surgical History:  Procedure Laterality Date  . ANKLE SURGERY    . DILATION AND CURETTAGE OF UTERUS    . WISDOM TOOTH EXTRACTION      There were no vitals filed for this visit.                              PT Short Term Goals - 12/30/19 1257      PT SHORT TERM GOAL #1   Title  Pt will be independent with initial HEP in order to build upon functional gains made in therapy. ALL STGS DUE 02/03/20    Time  6   due to delay in scheduling   Period  Weeks    Status  New    Target Date  02/03/20      PT SHORT TERM GOAL #2   Title  Pt will undergo further vestibular assessment - goals to be written as appropriate.    Time  6    Period  Weeks    Status  Achieved      PT SHORT TERM GOAL #3   Title  Pt will undergo assessment of TUG to determine fall risk  - goal to be written as appropriate.    Time  6    Period  Weeks    Status  Achieved      PT SHORT TERM  GOAL #4   Title  Pt will improve gait speed with RW to at least 2.3 ft/sec in order to decr fall risk and improve community mobility.    Baseline  1.78 ft/sec with RW.    Time  6    Period  Weeks    Status  New      PT SHORT TERM GOAL #5   Title  Pt will improve B cervical rotation AROM to at least 40 degrees with mild pain in order to improve functional mobility.    Time  6    Period  Weeks    Status  New        PT Long Term Goals - 12/30/19 1258      PT LONG TERM GOAL #1  Title  Pt will be independent with final HEP in order to build upon functional gains made in therapy. ALL LTGS DUE 03/21/20    Time  12    Period  Weeks    Status  New      PT LONG TERM GOAL #2   Title  Pt will ambulate at least 300' with LRAD with supervision over level and unlevel surfaces in order to improve community mobility.    Time  12    Period  Weeks    Status  New      PT LONG TERM GOAL #3   Title  Pt will improve BERG score to at least a 30/56 in order to determine decr fall risk.    Baseline  14/56 on 12/23/19    Time  12    Period  Weeks    Status  New      PT LONG TERM GOAL #4   Title  Pt will improve B cervical rotation AROM to 65 degrees for functional ROM for driving.    Baseline  R rotation 43 degrees, L rotation 25 degrees    Time  12    Period  Weeks    Status  New      PT LONG TERM GOAL #5   Title  Pt will decrease TUG time to </= 13.5 seconds without use of AD    Baseline  20.22 seconds with RW    Time  12    Period  Weeks    Status  Revised      PT LONG TERM GOAL #6   Title  Pt will improve gait speed with LRAD to at least 2.7 ft/sec in order to decr fall risk and improve community mobility.    Time  12    Period  Weeks    Status  New      PT LONG TERM GOAL #7   Title  Pt will demonstrate decreased motion sensitivity as indicated by ability to perform bending down to the floor, performing head turns/nods and body turns without any dizziness; pt will also improve use  of vestibular system as indicated by ability to maintain 30 seconds on each condition of the MCTSIB.    Baseline  lightheaded-mild dizziness on MSQ; 4 seconds on conditions 2,3,4; 30 seconds on condition 1 but with increased use of ankle strategy    Time  12    Period  Weeks    Status  New      PT LONG TERM GOAL #8   Title  Pt will demonstrate ability to stand without support x 10 minutes to play corn hole and demonstrate ability to hit whiffle golf balls in grass x 10 reps with supervision    Time  12    Period  Weeks    Status  New            Plan - 01/19/20 1632    Clinical Impression Statement  Pt arrived to therapy session today feeling very emotional and tearful after recent visit to the ER yesterday. Provided emotional support to pt as pt stating she feels like she has gotten worse. Pt reports feeling very down recently, pt reporting she would be open to try therapy/counseling. Discussed with pt about talking to Dr. Denyse Amass at appointment with him later today about a potential referral and pt in agreement. Pt ambulated in and out with RW requiring min guard from therapist and cues for longer step lengths B and  wider BOS, as pt with more shuffled steps and narrow BOS. Will continue to progress towards goals when appropriate.    Personal Factors and Comorbidities  Past/Current Experience;Profession    Examination-Activity Limitations  Bend;Carry;Dressing;Locomotion Level;Squat;Stairs;Lift;Stand;Transfers;Hygiene/Grooming;Bathing    Examination-Participation Restrictions  Cleaning;Community Activity;Driving;Shop;Laundry    Stability/Clinical Decision Making  Evolving/Moderate complexity    Rehab Potential  Good    PT Frequency  Other (comment)   3x/week x 8, 2x/week x 4   PT Duration  12 weeks    PT Treatment/Interventions  ADLs/Self Care Home Management;Canalith Repostioning;DME Instruction;Gait training;Stair training;Functional mobility training;Neuromuscular re-education;Balance  training;Therapeutic exercise;Therapeutic activities;Patient/family education;Dry needling;Passive range of motion;Energy conservation;Vestibular;Visual/perceptual remediation/compensation    PT Next Visit Plan  any updates from [facility] about aquatic therapy? Progress corner balance-static with no UE support, narrow BOS. Find ways to distract patient from focusing too much on balance as does better when distracted. Continue to progress balance in // bars with decr UE support. gentle stretches/ROM for cervical spine for HEP; eyes closed balance.  Wants to be able to play golf and cornhole    Consulted and Agree with Plan of Care  Patient;Family member/caregiver       Patient will benefit from skilled therapeutic intervention in order to improve the following deficits and impairments:  Abnormal gait, Decreased activity tolerance, Decreased balance, Decreased coordination, Difficulty walking, Decreased strength, Dizziness, Hypomobility, Increased muscle spasms, Pain, Decreased mobility  Visit Diagnosis: Muscle weakness (generalized)  Other abnormalities of gait and mobility  Ataxic gait  Other symptoms and signs involving the nervous system     Problem List Patient Active Problem List   Diagnosis Date Noted  . Anxiety and depression 01/19/2020  . Alcohol use 01/19/2020  . Concussion with loss of consciousness 01/03/2020  . Thyroid nodule 01/03/2020  . Irregular menses 04/28/2019  . Neuropathy 04/28/2019  . Infertility counseling 10/01/2017  . DDD (degenerative disc disease), cervical 11/29/2016  . Hypoglycemia 11/29/2016  . Kidney stone 11/29/2016  . Sinusitis 11/29/2016  . Insomnia 03/08/2016  . Neck pain 03/08/2016  . 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 [address] [place], [place], [address] Phone: [phone]   Fax:  [phone]  Name: Jane Hendricks MRN:  [id number] Date of Birth: 11-Nov-1980

## 2020-01-21 ENCOUNTER — Ambulatory Visit: Payer: 59 | Admitting: Physical Therapy

## 2020-01-24 ENCOUNTER — Other Ambulatory Visit: Payer: Self-pay

## 2020-01-24 ENCOUNTER — Ambulatory Visit: Payer: 59 | Admitting: Physical Therapy

## 2020-01-24 DIAGNOSIS — R42 Dizziness and giddiness: Secondary | ICD-10-CM

## 2020-01-24 DIAGNOSIS — R29818 Other symptoms and signs involving the nervous system: Secondary | ICD-10-CM

## 2020-01-24 DIAGNOSIS — M6281 Muscle weakness (generalized): Secondary | ICD-10-CM

## 2020-01-24 DIAGNOSIS — R260 Ataxic gait: Secondary | ICD-10-CM

## 2020-01-24 DIAGNOSIS — R2681 Unsteadiness on feet: Secondary | ICD-10-CM

## 2020-01-24 DIAGNOSIS — R2689 Other abnormalities of gait and mobility: Secondary | ICD-10-CM

## 2020-01-25 ENCOUNTER — Encounter: Payer: Self-pay | Admitting: Family Medicine

## 2020-01-25 ENCOUNTER — Ambulatory Visit (INDEPENDENT_AMBULATORY_CARE_PROVIDER_SITE_OTHER): Payer: 59 | Admitting: Family Medicine

## 2020-01-25 VITALS — BP 112/88 | HR 99 | Ht 64.0 in | Wt 129.6 lb

## 2020-01-25 DIAGNOSIS — F419 Anxiety disorder, unspecified: Secondary | ICD-10-CM

## 2020-01-25 DIAGNOSIS — E538 Deficiency of other specified B group vitamins: Secondary | ICD-10-CM

## 2020-01-25 DIAGNOSIS — E041 Nontoxic single thyroid nodule: Secondary | ICD-10-CM

## 2020-01-25 DIAGNOSIS — S060X9D Concussion with loss of consciousness of unspecified duration, subsequent encounter: Secondary | ICD-10-CM

## 2020-01-25 DIAGNOSIS — F32A Depression, unspecified: Secondary | ICD-10-CM

## 2020-01-25 DIAGNOSIS — F329 Major depressive disorder, single episode, unspecified: Secondary | ICD-10-CM

## 2020-01-25 MED ORDER — HYDROCODONE-ACETAMINOPHEN 5-325 MG PO TABS: 1.0000 | ORAL_TABLET | Freq: Four times a day (QID) | ORAL | 0 refills | Status: DC | PRN

## 2020-01-25 MED ORDER — MECLIZINE HCL 25 MG PO TABS: 25.0000 mg | ORAL_TABLET | Freq: Three times a day (TID) | ORAL | 3 refills | Status: AC | PRN

## 2020-01-25 MED ORDER — TRAZODONE HCL 50 MG PO TABS: 50.0000 mg | ORAL_TABLET | Freq: Every evening | ORAL | 1 refills | Status: DC | PRN

## 2020-01-25 MED ORDER — BUPROPION HCL ER (XL) 150 MG PO TB24: 150.0000 mg | ORAL_TABLET | ORAL | 1 refills | Status: DC

## 2020-01-25 MED ORDER — CYANOCOBALAMIN 1000 MCG/ML IJ SOLN
1000.0000 ug | Freq: Once | INTRAMUSCULAR | Status: AC
  Administered 2020-01-25: 1000 ug via INTRAMUSCULAR
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
## 2020-01-25 NOTE — Patient Instructions (Signed)
Thank you for coming in today. Continue prazac Continue trazodone Add wellbutrin.  Follow up with Sam and Neurology.  Consider therapy.  Buspar as needed for anxiety.  Hydrocodone sparingly.   B12 complex.  Continue Vit D  Recheck in 3 weeks.   Call [facility] to schedule therapy.  Phone: [phone]

## 2020-01-26 ENCOUNTER — Ambulatory Visit: Payer: 59 | Admitting: Physical Therapy

## 2020-01-26 ENCOUNTER — Other Ambulatory Visit: Payer: Self-pay

## 2020-01-26 ENCOUNTER — Encounter: Payer: Self-pay | Admitting: Physician Assistant

## 2020-01-26 ENCOUNTER — Telehealth: Payer: Self-pay | Admitting: Family Medicine

## 2020-01-26 ENCOUNTER — Ambulatory Visit (INDEPENDENT_AMBULATORY_CARE_PROVIDER_SITE_OTHER): Payer: 59 | Admitting: Physician Assistant

## 2020-01-26 VITALS — BP 122/80 | HR 84 | Temp 98.0°F | Ht 65.5 in | Wt 128.0 lb

## 2020-01-26 DIAGNOSIS — Z87898 Personal history of other specified conditions: Secondary | ICD-10-CM

## 2020-01-26 DIAGNOSIS — E041 Nontoxic single thyroid nodule: Secondary | ICD-10-CM | POA: Diagnosis not present

## 2020-01-26 DIAGNOSIS — R2689 Other abnormalities of gait and mobility: Secondary | ICD-10-CM

## 2020-01-26 DIAGNOSIS — E538 Deficiency of other specified B group vitamins: Secondary | ICD-10-CM

## 2020-01-26 DIAGNOSIS — F32A Depression, unspecified: Secondary | ICD-10-CM

## 2020-01-26 DIAGNOSIS — F419 Anxiety disorder, unspecified: Secondary | ICD-10-CM | POA: Diagnosis not present

## 2020-01-26 DIAGNOSIS — R2681 Unsteadiness on feet: Secondary | ICD-10-CM

## 2020-01-26 DIAGNOSIS — R260 Ataxic gait: Secondary | ICD-10-CM

## 2020-01-26 DIAGNOSIS — M6281 Muscle weakness (generalized): Secondary | ICD-10-CM

## 2020-01-26 DIAGNOSIS — S060X9D Concussion with loss of consciousness of unspecified duration, subsequent encounter: Secondary | ICD-10-CM

## 2020-01-26 DIAGNOSIS — E559 Vitamin D deficiency, unspecified: Secondary | ICD-10-CM

## 2020-01-26 DIAGNOSIS — F329 Major depressive disorder, single episode, unspecified: Secondary | ICD-10-CM

## 2020-01-26 DIAGNOSIS — R29818 Other symptoms and signs involving the nervous system: Secondary | ICD-10-CM

## 2020-01-26 MED ORDER — CYANOCOBALAMIN 1000 MCG/ML IJ SOLN: INTRAMUSCULAR | 15 refills | Status: DC

## 2020-01-26 MED ORDER — VITAMIN D (ERGOCALCIFEROL) 1.25 MG (50000 UNIT) PO CAPS: 50000.0000 [IU] | ORAL_CAPSULE | ORAL | 0 refills | Status: DC

## 2020-01-26 MED ORDER — BUPROPION HCL ER (XL) 150 MG PO TB24: 150.0000 mg | ORAL_TABLET | ORAL | 1 refills | Status: DC

## 2020-01-26 NOTE — Telephone Encounter (Signed)
I am happy to write the letter.  Who is the audience for this letter(  job or insurance)?  Who the letter is for will dictate how I write it.  Jane Hendricks

## 2020-01-26 NOTE — Telephone Encounter (Signed)
Patient states it would be for her job. She does not necessarily need it, but would like to have it just in case.

## 2020-01-26 NOTE — Patient Instructions (Signed)
It was great to see you!  Start B12 injections on your own -- do them weekly x 4 weeks. Then monthly. Try to remember your gummy vitamin. Start your wellbutrin tomorrow. We will be in touch regarding your biopsy. It is ok to take trazodone + buspar together.  Let's follow-up in 2-4 weeks, sooner if you have concerns.  Take care,  Jarold Motto PA-C
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
## 2020-01-27 ENCOUNTER — Encounter: Payer: Self-pay | Admitting: Family Medicine

## 2020-01-27 NOTE — Telephone Encounter (Signed)
Patient called to follow up to see if the letter had been written. She said that her boss is coming in from out of town tomorrow and she does not know if it will be needed or not.   I told the patient that I did not see anything in her chart but that I would follow up and let her know.  Please advise.

## 2020-01-27 NOTE — Telephone Encounter (Signed)
Letter is written and available for pickup.  Additionally a copy of this letter was sent electronically via MyChart.

## 2020-01-28 ENCOUNTER — Other Ambulatory Visit: Payer: Self-pay

## 2020-01-28 ENCOUNTER — Ambulatory Visit: Payer: 59 | Admitting: Physical Therapy

## 2020-01-28 DIAGNOSIS — R2689 Other abnormalities of gait and mobility: Secondary | ICD-10-CM

## 2020-01-28 DIAGNOSIS — R42 Dizziness and giddiness: Secondary | ICD-10-CM

## 2020-01-28 DIAGNOSIS — R29818 Other symptoms and signs involving the nervous system: Secondary | ICD-10-CM

## 2020-01-28 DIAGNOSIS — R260 Ataxic gait: Secondary | ICD-10-CM

## 2020-01-28 DIAGNOSIS — R2681 Unsteadiness on feet: Secondary | ICD-10-CM

## 2020-01-28 DIAGNOSIS — M6281 Muscle weakness (generalized): Secondary | ICD-10-CM
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
## 2020-01-28 NOTE — Patient Instructions (Signed)
Access Code: [email or web address] URL: [email or web address] Date: 01/28/2020 Prepared by: Sherlie Ban  Exercises Seated Gaze Stabilization with Head Rotation - 1 x daily - 7 x weekly - 2 sets - 30 seconds hold Seated Gaze Stabilization with Head Nod - 1 x daily - 7 x weekly - 2 sets - 10 seconds hold Standing Balance in Corner - 1 x daily - 7 x weekly - 10 reps - 2 sets Sit to Stand with Hands on Knees - 2 x daily - 7 x weekly - 5 reps - 2 sets Seated Cervical Rotation AROM - 1 x daily - 7 x weekly - 4-5 reps - 4 sets Seated Cervical Sidebending AROM - 1 x daily - 7 x weekly - 4-5 reps - 4 sets Backward Walking with Counter Support - 1 x daily - 7 x weekly - 3 sets Standing Marching - 1 x daily - 7 x weekly - 3 sets Wide Tandem Stance with Eyes Open - 1 x daily - 7 x weekly - 2 sets - 3 reps - 20 hold

## 2020-01-31 ENCOUNTER — Ambulatory Visit: Payer: 59 | Admitting: Physical Therapy

## 2020-01-31 ENCOUNTER — Telehealth: Payer: Self-pay | Admitting: Physician Assistant

## 2020-01-31 NOTE — Telephone Encounter (Signed)
ROI faxed to Dr Casimiro Needle [facility]/OBGYN 3/29/21fbg

## 2020-02-01 ENCOUNTER — Ambulatory Visit: Payer: 59 | Admitting: Physical Therapy

## 2020-02-01 ENCOUNTER — Other Ambulatory Visit: Payer: Self-pay

## 2020-02-01 ENCOUNTER — Telehealth: Payer: Self-pay | Admitting: Physical Therapy

## 2020-02-01 DIAGNOSIS — R2681 Unsteadiness on feet: Secondary | ICD-10-CM

## 2020-02-01 DIAGNOSIS — R29818 Other symptoms and signs involving the nervous system: Secondary | ICD-10-CM

## 2020-02-01 DIAGNOSIS — R42 Dizziness and giddiness: Secondary | ICD-10-CM

## 2020-02-01 DIAGNOSIS — M6281 Muscle weakness (generalized): Secondary | ICD-10-CM

## 2020-02-01 DIAGNOSIS — R260 Ataxic gait: Secondary | ICD-10-CM

## 2020-02-01 DIAGNOSIS — R2689 Other abnormalities of gait and mobility: Secondary | ICD-10-CM

## 2020-02-01 NOTE — Telephone Encounter (Signed)
Jarold Motto,  Yaffa Seckman has been seen for Physical Therapy at [facility] s/p concussion. The patient would benefit from a speech therapy referral due to reports of difficulty with memory and word recall. If you agree, please place an order in [facility] workque in Epic or fax the order to [phone].  Sherlie Ban, PT, DPT 02/01/20 6:19 PM

## 2020-02-01 NOTE — Telephone Encounter (Signed)
Recv'd records from [facility] [place] forwarded 54 pages to D. Jarold Motto 3/30/21fbg.

## 2020-02-01 NOTE — Therapy (Signed)
[facility] [address] [place], [place], [address] Phone: [phone]   Fax:  [phone]  Physical Therapy Treatment  Patient Details  Name: Jane Hendricks MRN: [id number] Date of Birth: 1981/02/14 Referring Provider (PT): Lynne Leader, MD   Encounter Date: 02/01/2020  PT End of Session - 02/01/20 1146    Visit Number  13    Number of Visits  33    Date for PT Re-Evaluation  03/21/20    Authorization Type  [employer] - $40 copay - VL: 60 (PT/OT/ST combined)    Authorization - Visit Number  13    Authorization - Number of Visits  60    PT Start Time  1103    PT Stop Time  1144    PT Time Calculation (min)  41 min    Equipment Utilized During Treatment  Gait belt    Activity Tolerance  Patient tolerated treatment well    Behavior During Therapy  WFL for tasks assessed/performed       Past Medical History:  Diagnosis Date  . Ataxic gait   . Compression fracture of C-spine ([place]) 12/12/2019   C4-C5  . GERD (gastroesophageal reflux disease)   . Herniated disc, cervical   . Hypoglycemia   . Migraines     Past Surgical History:  Procedure Laterality Date  . ANKLE SURGERY    . DILATION AND CURETTAGE OF UTERUS    . WISDOM TOOTH EXTRACTION      There were no vitals filed for this visit.  Subjective Assessment - 02/01/20 1105    Subjective  Parents are still in town. Feeling fine from trigger point injections now - took a couple more days to recover. Had a litle bit of radiating pain. Had some close calls, but no falls.  Finds that in the morning she is a lot more unstable, is better on later in the day. Woke up with a little pain, when she moves her head then its a little painful.    Patient is accompained by:  Family member   both parents   Pertinent History  PMH: hx of herniated disc in cervical spine    Diagnostic tests  MRI 2/13 cervical spine: No marrow edema to suggest acute cervical spine fracture. Cervical  spondylosis as outlined. No more than mild spinal canalstenosis at any level. Multilevel neural foraminal narrowinggreatest on the left at C4-C5 and bilaterally at C5-C6(moderate/advanced at these sites).MRI brain: Normal MRI appearance of the brain for age. No evidence of acuteintracranial abnormality    Patient Stated Goals  be able to walk and drive again to be more independent; to play golf and cornhole again         [facility] PT Assessment - 02/01/20 1109      AROM   Cervical Flexion  22   unable to go farther due to pain (4/10)4   Cervical Extension  22   feeling "pain and weakness"   Cervical - Right Side Bend  13   incr pain on R side and L upper trap/levator   Cervical - Left Side Bend  20   pain on L, no pain on R   Cervical - Right Rotation  30   4/10 pain   Cervical - Left Rotation  35      Strength   Overall Strength Comments  hip ABD strength:  4/5 B      Ambulation/Gait   Ambulation/Gait  Yes    Gait velocity  13.54  seconds = 2.42 ft/sec   with RW     Timed Up and Go Test   Normal TUG (seconds)  17.66    TUG Comments  --   with no AD                  [facility] Adult PT Treatment/Exercise - 02/01/20 1109      Ambulation/Gait   Ambulation/Gait Assistance  4: Min guard    Ambulation/Gait Assistance Details  cues for wider BOS and relaxing BUE for more nautral arm swing during gait to help assist with balance. pt ambulating in and out of clinic with mod I with RW    Ambulation Distance (Feet)  115 Feet   no AD   Assistive device  None    Gait Pattern  Step-through pattern;Narrow base of support    Ambulation Surface  Level;Indoor    Stairs  Yes    Stairs Assistance  5: Supervision    Stairs Assistance Details (indicate cue type and reason)  supervision ascending and descending stairs - pt with noted hip ABD weakness when descending stairs when in SLS    Stair Management Technique  One rail Left;Alternating pattern;Forwards    Number of Stairs  8     Height of Stairs  6      Therapeutic Activites    Other Therapeutic Activities  pt asking therapist about returning to driving. Therapist discussing with pt that it will be up to her physician to clear her to drive. Also educated pt on cervical rotation AROM norms for driving       Vestibular Treatment/Exercise - 02/01/20 0001      Vestibular Treatment/Exercise   Vestibular Treatment Provided  Gaze    Gaze Exercises  X1 Viewing Horizontal;X1 Viewing Vertical      X1 Viewing Horizontal   Foot Position  seated    Reps  10    Comments  x3, mild symptoms 2-3/10 dizziness, went away after a brief rest      X1 Viewing Vertical   Foot Position  seated    Reps  5    Comments  x3, mild symptoms (2/10 dizziness, 3/10 nausea) after 2nd rep , none after 3rd rep            PT Education - 02/01/20 1145    Education Details  progress towards goals, continuing with VOR x1 seated in horizontal and vertical directions for HEP - see TA    Person(s) Educated  Patient    Methods  Explanation;Demonstration    Comprehension  Verbalized understanding;Returned demonstration       PT Short Term Goals - 02/01/20 1115      PT SHORT TERM GOAL #1   Title  Pt will be independent with initial HEP in order to build upon functional gains made in therapy. ALL STGS DUE 02/03/20    Time  6   due to delay in scheduling   Period  Weeks    Status  Partially Met    Target Date  02/03/20      PT SHORT TERM GOAL #2   Title  Pt will undergo further vestibular assessment - goals to be written as appropriate.    Time  6    Period  Weeks    Status  Achieved      PT SHORT TERM GOAL #3   Title  Pt will undergo assessment of TUG to determine fall risk  - goal to be written as appropriate.  Time  6    Period  Weeks    Status  Achieved      PT SHORT TERM GOAL #4   Title  Pt will improve gait speed with RW to at least 2.3 ft/sec in order to decr fall risk and improve community mobility.    Baseline  13.54  seconds = 2.42 ft/sec on 02/01/20    Time  6    Period  Weeks    Status  Achieved      PT SHORT TERM GOAL #5   Title  Pt will improve B cervical rotation AROM to at least 40 degrees with mild pain in order to improve functional mobility.    Baseline  30 degrees to R, 35 degrees to L.    Time  6    Period  Weeks    Status  Not Met      Revised/ongoing STGs:   PT Short Term Goals - 02/01/20 2049      PT SHORT TERM GOAL #1   Title  Pt will undergo assessment of gait speed with no AD. ALL STGS DUE 02/29/20    Time  4    Period  Weeks    Status  Partially Met    Target Date  02/29/20      PT SHORT TERM GOAL #2   Title  Pt will decrease TUG time to 14 seconds or less with no AD in order to demo decr fall risk.    Time  4    Period  Weeks    Status  New      PT SHORT TERM GOAL #3   Title  Pt will ambulate at least 230' with no AD with supervision in order to improve functional mobility.    Time  4    Period  Weeks    Status  New      PT SHORT TERM GOAL #4   Title  Pt will improve gait speed with RW to at least 2.8 ft/sec in order to improve community mobility.    Baseline  13.54 seconds = 2.42 ft/sec on 02/01/20    Time  4    Period  Weeks    Status  Revised      PT SHORT TERM GOAL #5   Title  Pt will improve B cervical rotation AROM to at least 45 degrees with mild pain in order to improve functional mobility.    Baseline  30 degrees to R, 35 degrees to L.    Time  4    Period  Weeks    Status  Revised      Additional Short Term Goals   Additional Short Term Goals  Yes      PT SHORT TERM GOAL #6   Title  Pt will improve BERG score to at least 35/56 in order to demo decreased fall risk.    Baseline  14/56 on 12/23/19    Time  4    Period  Weeks    Status  New        PT Long Term Goals - 12/30/19 1258      PT LONG TERM GOAL #1   Title  Pt will be independent with final HEP in order to build upon functional gains made in therapy. ALL LTGS DUE 03/21/20    Time  12     Period  Weeks    Status  New      PT LONG TERM GOAL #2  Title  Pt will ambulate at least 300' with LRAD with supervision over level and unlevel surfaces in order to improve community mobility.    Time  12    Period  Weeks    Status  New      PT LONG TERM GOAL #3   Title  Pt will improve BERG score to at least a 30/56 in order to determine decr fall risk.    Baseline  14/56 on 12/23/19    Time  12    Period  Weeks    Status  New      PT LONG TERM GOAL #4   Title  Pt will improve B cervical rotation AROM to 65 degrees for functional ROM for driving.    Baseline  R rotation 43 degrees, L rotation 25 degrees    Time  12    Period  Weeks    Status  New      PT LONG TERM GOAL #5   Title  Pt will decrease TUG time to </= 13.5 seconds without use of AD    Baseline  20.22 seconds with RW    Time  12    Period  Weeks    Status  Revised      PT LONG TERM GOAL #6   Title  Pt will improve gait speed with LRAD to at least 2.7 ft/sec in order to decr fall risk and improve community mobility.    Time  12    Period  Weeks    Status  New      PT LONG TERM GOAL #7   Title  Pt will demonstrate decreased motion sensitivity as indicated by ability to perform bending down to the floor, performing head turns/nods and body turns without any dizziness; pt will also improve use of vestibular system as indicated by ability to maintain 30 seconds on each condition of the MCTSIB.    Baseline  lightheaded-mild dizziness on MSQ; 4 seconds on conditions 2,3,4; 30 seconds on condition 1 but with increased use of ankle strategy    Time  12    Period  Weeks    Status  New      PT LONG TERM GOAL #8   Title  Pt will demonstrate ability to stand without support x 10 minutes to play corn hole and demonstrate ability to hit whiffle golf balls in grass x 10 reps with supervision    Time  12    Period  Weeks    Status  New            Plan - 02/01/20 2017    Clinical Impression Statement  Focus of  today's skilled session was assessing pt's STGs. Pt has achieved 3 out of 5 STGs. Pt improved her gait speed today with RW to 2.42 ft/sec (previously was 1.78 ft/sec) - indicating that pt is a limited community ambulator. Performed the TUG today with no AD in 17.66 seconds, indicating pt is at a higher fall risk. Pt remains limited in all cervical AROM - has stayed approximately the same since eval and pt continues with pain at all end ranges. Reviewed pt's HEP as she has not been performing VOR x1 consistently at home, with seated VOR x1 in horizontal and vertical directions pt with minimal symptoms. Educated on importance of performing at home. Pt able to incr gait distance today in clinic with min guard with no AD. Revised STGs as appropriate.    Personal Factors and Comorbidities  Past/Current Experience;Profession    Examination-Activity Limitations  Bend;Carry;Dressing;Locomotion Level;Squat;Stairs;Lift;Stand;Transfers;Hygiene/Grooming;Bathing    Examination-Participation Restrictions  Cleaning;Community Activity;Driving;Shop;Laundry    Stability/Clinical Decision Making  Evolving/Moderate complexity    Rehab Potential  Good    PT Frequency  Other (comment)   3x/week x 8, 2x/week x 4   PT Duration  12 weeks    PT Treatment/Interventions  ADLs/Self Care Home Management;Canalith Repostioning;DME Instruction;Gait training;Stair training;Functional mobility training;Neuromuscular re-education;Balance training;Therapeutic exercise;Therapeutic activities;Patient/family education;Dry needling;Passive range of motion;Energy conservation;Vestibular;Visual/perceptual remediation/compensation    PT Next Visit Plan  continue with VOR x1 horizontal and vertical in seated - try to progress time/reps. Dry needling to neck/shoulder mm - any manual therapy/stretches for neck to incr ROM. continue gait with no AD around clinic. needs hip ABD strengthening. Safety checks intermittently.  any aquatic therapy? eyes  closed balance, balance on compliant surfaces. Wants to be able to play golf and cornhole    Recommended Other Services  sent referral for speech therapy on 02/01/20 due to difficulties with memory and word recall    Consulted and Agree with Plan of Care  Patient    Family Member Consulted  --       Patient will benefit from skilled therapeutic intervention in order to improve the following deficits and impairments:  Abnormal gait, Decreased activity tolerance, Decreased balance, Decreased coordination, Difficulty walking, Decreased strength, Dizziness, Hypomobility, Increased muscle spasms, Pain, Decreased mobility  Visit Diagnosis: Other symptoms and signs involving the nervous system  Other abnormalities of gait and mobility  Ataxic gait  Unsteadiness on feet  Muscle weakness (generalized)  Dizziness and giddiness     Problem List Patient Active Problem List   Diagnosis Date Noted  . Vitamin D deficiency 01/26/2020  . B12 deficiency 01/25/2020  . Anxiety and depression 01/19/2020  . Alcohol use 01/19/2020  . Concussion with loss of consciousness 01/03/2020  . Thyroid nodule 01/03/2020  . Irregular menses 04/28/2019  . Neuropathy 04/28/2019  . Infertility counseling 10/01/2017  . DDD (degenerative disc disease), cervical 11/29/2016  . Hypoglycemia 11/29/2016  . Kidney stone 11/29/2016  . Sinusitis 11/29/2016  . Insomnia 03/08/2016  . Neck pain 03/08/2016  . 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

## 2020-02-02 ENCOUNTER — Encounter: Payer: Self-pay | Admitting: Physical Therapy

## 2020-02-02 ENCOUNTER — Other Ambulatory Visit: Payer: Self-pay | Admitting: Physician Assistant

## 2020-02-02 ENCOUNTER — Ambulatory Visit: Payer: 59 | Admitting: Physical Therapy

## 2020-02-02 DIAGNOSIS — R2689 Other abnormalities of gait and mobility: Secondary | ICD-10-CM

## 2020-02-02 DIAGNOSIS — R29818 Other symptoms and signs involving the nervous system: Secondary | ICD-10-CM

## 2020-02-02 DIAGNOSIS — R260 Ataxic gait: Secondary | ICD-10-CM

## 2020-02-02 DIAGNOSIS — R42 Dizziness and giddiness: Secondary | ICD-10-CM

## 2020-02-02 DIAGNOSIS — M6281 Muscle weakness (generalized): Secondary | ICD-10-CM

## 2020-02-02 DIAGNOSIS — S060X9D Concussion with loss of consciousness of unspecified duration, subsequent encounter: Secondary | ICD-10-CM

## 2020-02-02 DIAGNOSIS — R2681 Unsteadiness on feet: Secondary | ICD-10-CM

## 2020-02-02 NOTE — Therapy (Signed)
[facility] [address] [place], [place], [address] Phone: [phone]   Fax:  [phone]  Physical Therapy Treatment  Patient Details  Name: Jane Hendricks MRN: [id number] Date of Birth: 1981/02/19 Referring Provider (PT): Lynne Leader, MD   Encounter Date: 02/02/2020  PT End of Session - 02/02/20 1623    Visit Number  14    Number of Visits  33    Date for PT Re-Evaluation  03/21/20    Authorization Type  [employer] - $40 copay - VL: 60 (PT/OT/ST combined)    Authorization - Visit Number  14    Authorization - Number of Visits  60    PT Start Time  1230    PT Stop Time  1315    PT Time Calculation (min)  45 min    Activity Tolerance  Patient tolerated treatment well    Behavior During Therapy  WFL for tasks assessed/performed       Past Medical History:  Diagnosis Date  . Ataxic gait   . Compression fracture of C-spine ([place]) 12/12/2019   C4-C5  . GERD (gastroesophageal reflux disease)   . Herniated disc, cervical   . Hypoglycemia   . Migraines     Past Surgical History:  Procedure Laterality Date  . ANKLE SURGERY    . DILATION AND CURETTAGE OF UTERUS    . WISDOM TOOTH EXTRACTION      There were no vitals filed for this visit.  Subjective Assessment - 02/02/20 1614    Subjective  Still taking B12 vitamin and injections - reports it may take 6 months to a year for nerves to recover.  Felt a lot of relief after previous dry needling and is looking forward to having other muscles needled today.  Husband with pt today to observe dry needling.  Observed pt transfer without using walker today indicating improving balance.    Patient is accompained by:  Family member   both parents   Pertinent History  PMH: hx of herniated disc in cervical spine    Diagnostic tests  MRI 2/13 cervical spine: No marrow edema to suggest acute cervical spine fracture. Cervical spondylosis as outlined. No more than mild spinal  canalstenosis at any level. Multilevel neural foraminal narrowinggreatest on the left at C4-C5 and bilaterally at C5-C6(moderate/advanced at these sites).MRI brain: Normal MRI appearance of the brain for age. No evidence of acuteintracranial abnormality    Patient Stated Goals  be able to walk and drive again to be more independent; to play golf and cornhole again    Currently in Pain?  No/denies                       [facility] Adult PT Treatment/Exercise - 02/02/20 1619      Transfers   Transfers  Stand Pivot Transfers    Sit to Stand  6: Modified independent (Device/Increase time)    Sit to Stand Details (indicate cue type and reason)  without use of UE and able to maintain standing without UE support    Stand to Sit  6: Modified independent (Device/Increase time)    Stand Pivot Transfers  6: Modified independent (Device/Increase time)    Comments  stand pivot pt started with RW but then put RW aside and finished transfer without UE support on RW without any LOB      Therapeutic Activites    Therapeutic Activities  Other Therapeutic Activities    Other Therapeutic Activities  Reviewed education regarding dry needling methodology, purpose and recommendations after dry needling with husband; answered husband's questions about dry needling      Exercises   Exercises  Neck      Neck Exercises: Stretches   Levator Stretch  Right;Left;1 rep;30 seconds    Lower Cervical/Upper Thoracic Stretch  2 reps;30 seconds    Lower Cervical/Upper Thoracic Stretch Limitations  following dry needling       Trigger Point Dry Needling - 02/02/20 1617    Consent Given?  Yes    Muscles Treated Head and Neck  Upper trapezius;Levator scapulae;Splenius capitus;Semispinalis capitus;Cervical multifidi    Dry Needling Comments  performed levator and upper trap on L; upper cervical muscles on R    Upper Trapezius Response  Twitch reponse elicited;Palpable increased muscle length    Levator Scapulae  Response  Twitch response elicited    Splenius capitus Response  Twitch reponse elicited    Semispinalis capitus Response  Twitch reponse elicited    Cervical multifidi Response  Twitch reponse elicited           PT Education - 02/02/20 1622    Education Details  educated husband about dry needling    Person(s) Educated  Patient;Spouse    Methods  Explanation;Demonstration    Comprehension  Verbalized understanding       PT Short Term Goals - 02/01/20 2049      PT SHORT TERM GOAL #1   Title  Pt will undergo assessment of gait speed with no AD. ALL STGS DUE 02/29/20    Time  4    Period  Weeks    Status  Partially Met    Target Date  02/29/20      PT SHORT TERM GOAL #2   Title  Pt will decrease TUG time to 14 seconds or less with no AD in order to demo decr fall risk.    Time  4    Period  Weeks    Status  New      PT SHORT TERM GOAL #3   Title  Pt will ambulate at least 230' with no AD with supervision in order to improve functional mobility.    Time  4    Period  Weeks    Status  New      PT SHORT TERM GOAL #4   Title  Pt will improve gait speed with RW to at least 2.8 ft/sec in order to improve community mobility.    Baseline  13.54 seconds = 2.42 ft/sec on 02/01/20    Time  4    Period  Weeks    Status  Revised      PT SHORT TERM GOAL #5   Title  Pt will improve B cervical rotation AROM to at least 45 degrees with mild pain in order to improve functional mobility.    Baseline  30 degrees to R, 35 degrees to L.    Time  4    Period  Weeks    Status  Revised      Additional Short Term Goals   Additional Short Term Goals  Yes      PT SHORT TERM GOAL #6   Title  Pt will improve BERG score to at least 35/56 in order to demo decreased fall risk.    Baseline  14/56 on 12/23/19    Time  4    Period  Weeks    Status  New  PT Long Term Goals - 02/01/20 2052      PT LONG TERM GOAL #1   Title  Pt will be independent with final HEP in order to build upon  functional gains made in therapy. ALL LTGS DUE 03/21/20    Time  12    Period  Weeks    Status  New      PT LONG TERM GOAL #2   Title  Pt will ambulate at least 300' with LRAD with supervision over level and unlevel surfaces in order to improve community mobility.    Time  12    Period  Weeks    Status  New      PT LONG TERM GOAL #3   Title  Pt will improve BERG score to at least a 45/56 in order to determine decr fall risk.    Baseline  14/56 on 12/23/19    Time  12    Period  Weeks    Status  Revised      PT LONG TERM GOAL #4   Title  Pt will improve B cervical rotation AROM to 65 degrees for functional ROM for driving.    Baseline  R rotation 43 degrees, L rotation 25 degrees    Time  12    Period  Weeks    Status  New      PT LONG TERM GOAL #5   Title  Pt will decrease TUG time to </= 13.5 seconds without use of AD    Baseline  20.22 seconds with RW    Time  12    Period  Weeks    Status  Revised      PT LONG TERM GOAL #6   Title  Pt will improve gait speed with LRAD to at least 2.7 ft/sec in order to decr fall risk and improve community mobility.    Time  12    Period  Weeks    Status  New      PT LONG TERM GOAL #7   Title  Pt will demonstrate decreased motion sensitivity as indicated by ability to perform bending down to the floor, performing head turns/nods and body turns without any dizziness; pt will also improve use of vestibular system as indicated by ability to maintain 30 seconds on each condition of the MCTSIB.    Baseline  lightheaded-mild dizziness on MSQ; 4 seconds on conditions 2,3,4; 30 seconds on condition 1 but with increased use of ankle strategy    Time  12    Period  Weeks    Status  New      PT LONG TERM GOAL #8   Title  Pt will demonstrate ability to stand without support x 10 minutes to play corn hole and demonstrate ability to hit whiffle golf balls in grass x 10 reps with supervision    Time  12    Period  Weeks    Status  New             Plan - 02/02/20 1317    Clinical Impression Statement  Continued to address cervical tension with trigger point dry needling on L and R side with pt in prone to improve ROM, decrease headache, dizziness and pain.  Pt demonstrated significant trigger points in upper trap, levator bilaterally and upper cervical muscles.  Pt tolerated needling and post needling stretches well.  Will further address R side next week and will continue to progress towards other goals.  Personal Factors and Comorbidities  Past/Current Experience;Profession    Examination-Activity Limitations  Bend;Carry;Dressing;Locomotion Level;Squat;Stairs;Lift;Stand;Transfers;Hygiene/Grooming;Bathing    Examination-Participation Restrictions  Cleaning;Community Activity;Driving;Shop;Laundry    Stability/Clinical Decision Making  Evolving/Moderate complexity    Rehab Potential  Good    PT Frequency  Other (comment)   3x/week x 8, 2x/week x 4   PT Duration  12 weeks    PT Treatment/Interventions  ADLs/Self Care Home Management;Canalith Repostioning;DME Instruction;Gait training;Stair training;Functional mobility training;Neuromuscular re-education;Balance training;Therapeutic exercise;Therapeutic activities;Patient/family education;Dry needling;Passive range of motion;Energy conservation;Vestibular;Visual/perceptual remediation/compensation    PT Next Visit Plan  TDN to R levator/trap if needed.  continue with VOR x1 horizontal and vertical in seated - try to progress time/reps. Dry needling to neck/shoulder mm - any manual therapy/stretches for neck to incr ROM. continue gait with no AD around clinic. needs hip ABD strengthening. Safety checks intermittently.  any aquatic therapy? eyes closed balance, balance on compliant surfaces. Wants to be able to play golf and cornhole    Consulted and Agree with Plan of Care  Patient       Patient will benefit from skilled therapeutic intervention in order to improve the following  deficits and impairments:  Abnormal gait, Decreased activity tolerance, Decreased balance, Decreased coordination, Difficulty walking, Decreased strength, Dizziness, Hypomobility, Increased muscle spasms, Pain, Decreased mobility  Visit Diagnosis: Other symptoms and signs involving the nervous system  Other abnormalities of gait and mobility  Ataxic gait  Unsteadiness on feet  Muscle weakness (generalized)  Dizziness and giddiness     Problem List Patient Active Problem List   Diagnosis Date Noted  . Vitamin D deficiency 01/26/2020  . B12 deficiency 01/25/2020  . Anxiety and depression 01/19/2020  . Alcohol use 01/19/2020  . Concussion with loss of consciousness 01/03/2020  . Thyroid nodule 01/03/2020  . Irregular menses 04/28/2019  . Neuropathy 04/28/2019  . Infertility counseling 10/01/2017  . DDD (degenerative disc disease), cervical 11/29/2016  . Hypoglycemia 11/29/2016  . Kidney stone 11/29/2016  . Sinusitis 11/29/2016  . Insomnia 03/08/2016  . Neck pain 03/08/2016  . 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

## 2020-02-07 ENCOUNTER — Other Ambulatory Visit: Payer: Self-pay

## 2020-02-07 ENCOUNTER — Ambulatory Visit: Payer: 59 | Attending: Family Medicine | Admitting: Physical Therapy

## 2020-02-07 ENCOUNTER — Encounter: Payer: Self-pay | Admitting: Physical Therapy

## 2020-02-07 ENCOUNTER — Ambulatory Visit: Payer: Self-pay | Admitting: Psychologist

## 2020-02-07 DIAGNOSIS — R260 Ataxic gait: Secondary | ICD-10-CM | POA: Insufficient documentation

## 2020-02-07 DIAGNOSIS — R2681 Unsteadiness on feet: Secondary | ICD-10-CM | POA: Diagnosis present

## 2020-02-07 DIAGNOSIS — R2689 Other abnormalities of gait and mobility: Secondary | ICD-10-CM | POA: Insufficient documentation

## 2020-02-07 DIAGNOSIS — R29818 Other symptoms and signs involving the nervous system: Secondary | ICD-10-CM | POA: Insufficient documentation

## 2020-02-07 DIAGNOSIS — M6281 Muscle weakness (generalized): Secondary | ICD-10-CM | POA: Insufficient documentation

## 2020-02-07 DIAGNOSIS — R42 Dizziness and giddiness: Secondary | ICD-10-CM | POA: Insufficient documentation

## 2020-02-07 NOTE — Therapy (Signed)
[facility] [address] [place], [place], [address] Phone: [phone]   Fax:  [phone]  Physical Therapy Treatment  Patient Details  Name: Jane Hendricks MRN: [id number] Date of Birth: 04/11/1981 Referring Provider (PT): Lynne Leader, MD   Encounter Date: 02/07/2020  PT End of Session - 02/07/20 2209    Visit Number  15    Number of Visits  33    Date for PT Re-Evaluation  03/21/20    Authorization Type  [employer] - $40 copay - VL: 60 (PT/OT/ST combined)    Authorization - Visit Number  15    Authorization - Number of Visits  60    PT Start Time  1237    PT Stop Time  1322    PT Time Calculation (min)  45 min    Activity Tolerance  Patient tolerated treatment well    Behavior During Therapy  [facility] for tasks assessed/performed       Past Medical History:  Diagnosis Date  . Ataxic gait   . Compression fracture of C-spine ([place]) 12/12/2019   C4-C5  . GERD (gastroesophageal reflux disease)   . Herniated disc, cervical   . Hypoglycemia   . Migraines     Past Surgical History:  Procedure Laterality Date  . ANKLE SURGERY    . DILATION AND CURETTAGE OF UTERUS    . WISDOM TOOTH EXTRACTION      There were no vitals filed for this visit.  Subjective Assessment - 02/07/20 1240    Subjective  Mother and older sister present to observe.  Pt asking about aquatic therapy - if [facility] would cover aquatic.  Less sore after last session and it eased up quicker after last session.  Two dizzy spells when bending down and the other was at work when moving too quickly.  HA have been mild and less often.    Patient is accompained by:  Family member   both parents   Pertinent History  PMH: hx of herniated disc in cervical spine    Diagnostic tests  MRI 2/13 cervical spine: No marrow edema to suggest acute cervical spine fracture. Cervical spondylosis as outlined. No more than mild spinal canalstenosis at any level. Multilevel neural  foraminal narrowinggreatest on the left at C4-C5 and bilaterally at C5-C6(moderate/advanced at these sites).MRI brain: Normal MRI appearance of the brain for age. No evidence of acuteintracranial abnormality    Patient Stated Goals  be able to walk and drive again to be more independent; to play golf and cornhole again    Currently in Pain?  No/denies                       [facility] Adult PT Treatment/Exercise - 02/07/20 2203      Therapeutic Activites    Therapeutic Activities  Other Therapeutic Activities    Other Therapeutic Activities  continued to review education regarding purpose, technique, methodology of dry needling, side effects and recommendations after dry needling with pt's mother and sister; answered all questions they had about needling and patient's response with pt in the room; pt gave consent to have family present and for them to ask questions about treatment intervention; also discussed with pt that PT would follow up with front office team lead about [employer] coverage of aquatic therapy      Exercises   Exercises  Neck      Manual Therapy   Manual Therapy  Soft tissue mobilization  Soft tissue mobilization  STM to R levator muscle for increased ROM and lengthening following trigger point dry needling to R levator      Neck Exercises: Stretches   Upper Trapezius Stretch  Right;1 rep;30 seconds    Upper Trapezius Stretch Limitations  after trigger point dry needling    Levator Stretch  Right    Levator Stretch Limitations  after trigger point dry needling    Lower Cervical/Upper Thoracic Stretch  2 reps;30 seconds    Lower Cervical/Upper Thoracic Stretch Limitations  after trigger point dry needling       Trigger Point Dry Needling - 02/07/20 1244    Consent Given?  Yes    Education Handout Provided  No    Muscles Treated Head and Neck  Upper trapezius;Levator scapulae;Splenius capitus;Semispinalis capitus;Cervical multifidi    Dry Needling Comments   Performed in prone on R side     Upper Trapezius Response  Twitch reponse elicited;Palpable increased muscle length    Levator Scapulae Response  Twitch response elicited;Palpable increased muscle length    Splenius capitus Response  Twitch reponse elicited    Semispinalis capitus Response  Twitch reponse elicited    Cervical multifidi Response  Twitch reponse elicited           PT Education - 02/07/20 2208    Education Details  see TA    Person(s) Educated  Patient;Parent(s);Other (comment)   sister   Methods  Explanation    Comprehension  Verbalized understanding       PT Short Term Goals - 02/01/20 2049      PT SHORT TERM GOAL #1   Title  Pt will undergo assessment of gait speed with no AD. ALL STGS DUE 02/29/20    Time  4    Period  Weeks    Status  Partially Met    Target Date  02/29/20      PT SHORT TERM GOAL #2   Title  Pt will decrease TUG time to 14 seconds or less with no AD in order to demo decr fall risk.    Time  4    Period  Weeks    Status  New      PT SHORT TERM GOAL #3   Title  Pt will ambulate at least 230' with no AD with supervision in order to improve functional mobility.    Time  4    Period  Weeks    Status  New      PT SHORT TERM GOAL #4   Title  Pt will improve gait speed with RW to at least 2.8 ft/sec in order to improve community mobility.    Baseline  13.54 seconds = 2.42 ft/sec on 02/01/20    Time  4    Period  Weeks    Status  Revised      PT SHORT TERM GOAL #5   Title  Pt will improve B cervical rotation AROM to at least 45 degrees with mild pain in order to improve functional mobility.    Baseline  30 degrees to R, 35 degrees to L.    Time  4    Period  Weeks    Status  Revised      Additional Short Term Goals   Additional Short Term Goals  Yes      PT SHORT TERM GOAL #6   Title  Pt will improve BERG score to at least 35/56 in order to demo decreased fall risk.  Baseline  14/56 on 12/23/19    Time  4    Period  Weeks     Status  New        PT Long Term Goals - 02/01/20 2052      PT LONG TERM GOAL #1   Title  Pt will be independent with final HEP in order to build upon functional gains made in therapy. ALL LTGS DUE 03/21/20    Time  12    Period  Weeks    Status  New      PT LONG TERM GOAL #2   Title  Pt will ambulate at least 300' with LRAD with supervision over level and unlevel surfaces in order to improve community mobility.    Time  12    Period  Weeks    Status  New      PT LONG TERM GOAL #3   Title  Pt will improve BERG score to at least a 45/56 in order to determine decr fall risk.    Baseline  14/56 on 12/23/19    Time  12    Period  Weeks    Status  Revised      PT LONG TERM GOAL #4   Title  Pt will improve B cervical rotation AROM to 65 degrees for functional ROM for driving.    Baseline  R rotation 43 degrees, L rotation 25 degrees    Time  12    Period  Weeks    Status  New      PT LONG TERM GOAL #5   Title  Pt will decrease TUG time to </= 13.5 seconds without use of AD    Baseline  20.22 seconds with RW    Time  12    Period  Weeks    Status  Revised      PT LONG TERM GOAL #6   Title  Pt will improve gait speed with LRAD to at least 2.7 ft/sec in order to decr fall risk and improve community mobility.    Time  12    Period  Weeks    Status  New      PT LONG TERM GOAL #7   Title  Pt will demonstrate decreased motion sensitivity as indicated by ability to perform bending down to the floor, performing head turns/nods and body turns without any dizziness; pt will also improve use of vestibular system as indicated by ability to maintain 30 seconds on each condition of the MCTSIB.    Baseline  lightheaded-mild dizziness on MSQ; 4 seconds on conditions 2,3,4; 30 seconds on condition 1 but with increased use of ankle strategy    Time  12    Period  Weeks    Status  New      PT LONG TERM GOAL #8   Title  Pt will demonstrate ability to stand without support x 10 minutes to play  corn hole and demonstrate ability to hit whiffle golf balls in grass x 10 reps with supervision    Time  12    Period  Weeks    Status  New            Plan - 02/07/20 2209    Clinical Impression Statement  Continued to address cervical tension by focusing more specifically on R side with trigger point dry needling and STM followed by patient performing seated stretches.  Continued to provide education regarding TDN to family present.  No increase in pain,  no dizziness and no new onset of HA following TDN.  Will continue to address cervical paraspinals at next session.    Personal Factors and Comorbidities  Past/Current Experience;Profession    Examination-Activity Limitations  Bend;Carry;Dressing;Locomotion Level;Squat;Stairs;Lift;Stand;Transfers;Hygiene/Grooming;Bathing    Examination-Participation Restrictions  Cleaning;Community Activity;Driving;Shop;Laundry    Stability/Clinical Decision Making  Evolving/Moderate complexity    Rehab Potential  Good    PT Frequency  Other (comment)   3x/week x 8, 2x/week x 4   PT Duration  12 weeks    PT Treatment/Interventions  ADLs/Self Care Home Management;Canalith Repostioning;DME Instruction;Gait training;Stair training;Functional mobility training;Neuromuscular re-education;Balance training;Therapeutic exercise;Therapeutic activities;Patient/family education;Dry needling;Passive range of motion;Energy conservation;Vestibular;Visual/perceptual remediation/compensation    PT Next Visit Plan  TDN To cervical paraspinals/suboccipital?  continue with VOR x1 horizontal and vertical in seated - try to progress time/reps. Dry needling to neck/shoulder mm - any manual therapy/stretches for neck to incr ROM. continue gait with no AD around clinic. needs hip ABD strengthening. Safety checks intermittently.  any aquatic therapy? eyes closed balance, balance on compliant surfaces. Wants to be able to play golf and cornhole    Recommended Other Services  I sent a  follow up message to Pearline Cables about [employer] coverage of aquatic therapy; pt is still interested in aquatic.  Also, she did have a 60 VL before; if she is 3x/week she is going to go through those visits quickly.  I will check with Pearline Cables about the VL as well.    Consulted and Agree with Plan of Care  Patient;Family member/caregiver    Family Member Consulted  mother and sister       Patient will benefit from skilled therapeutic intervention in order to improve the following deficits and impairments:  Abnormal gait, Decreased activity tolerance, Decreased balance, Decreased coordination, Difficulty walking, Decreased strength, Dizziness, Hypomobility, Increased muscle spasms, Pain, Decreased mobility  Visit Diagnosis: Other symptoms and signs involving the nervous system  Other abnormalities of gait and mobility  Ataxic gait  Unsteadiness on feet  Muscle weakness (generalized)  Dizziness and giddiness     Problem List Patient Active Problem List   Diagnosis Date Noted  . Vitamin D deficiency 01/26/2020  . B12 deficiency 01/25/2020  . Anxiety and depression 01/19/2020  . Alcohol use 01/19/2020  . Concussion with loss of consciousness 01/03/2020  . Thyroid nodule 01/03/2020  . Irregular menses 04/28/2019  . Neuropathy 04/28/2019  . Infertility counseling 10/01/2017  . DDD (degenerative disc disease), cervical 11/29/2016  . Hypoglycemia 11/29/2016  . Kidney stone 11/29/2016  . Sinusitis 11/29/2016  . Insomnia 03/08/2016  . Neck pain 03/08/2016  . 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 [place], [place], [address] Phone: [phone]   Fax:  [phone]  Name: Leena Tiede MRN: [id number] Date of Birth: 1981/08/08

## 2020-02-08 ENCOUNTER — Ambulatory Visit (INDEPENDENT_AMBULATORY_CARE_PROVIDER_SITE_OTHER): Payer: 59 | Admitting: Family Medicine

## 2020-02-08 ENCOUNTER — Encounter: Payer: Self-pay | Admitting: Family Medicine

## 2020-02-08 VITALS — BP 116/70 | HR 80 | Ht 65.5 in | Wt 131.4 lb

## 2020-02-08 DIAGNOSIS — S060X9D Concussion with loss of consciousness of unspecified duration, subsequent encounter: Secondary | ICD-10-CM

## 2020-02-08 DIAGNOSIS — F32A Depression, unspecified: Secondary | ICD-10-CM

## 2020-02-08 DIAGNOSIS — F329 Major depressive disorder, single episode, unspecified: Secondary | ICD-10-CM

## 2020-02-08 DIAGNOSIS — F419 Anxiety disorder, unspecified: Secondary | ICD-10-CM

## 2020-02-08 DIAGNOSIS — M542 Cervicalgia: Secondary | ICD-10-CM

## 2020-02-08 MED ORDER — DULOXETINE HCL 30 MG PO CPEP: ORAL_CAPSULE | ORAL | 0 refills | Status: DC
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
## 2020-02-08 NOTE — Patient Instructions (Addendum)
Thank you for coming in today. Plan to switch off of prozac and wellbutrin.  Take 1/2 dose of prozac (20mg ) tomorrow. OK to take with Wellbutrin.  On Thursday stop prozac and start Cymbalta 30mg  (1 pill) daily.  Increase to 2 pills (60mg ) in 1 week after starting.  Continue the therapies.   Time and effort will help in the long run.   For sleep. Cymbalta is usually a but sedating. Lets see if there is any difference on Cymbalta. Let me know if no difference.   If not better I can use other medicine. I will stay away from Ambien and Lunesta.

## 2020-02-09 ENCOUNTER — Encounter: Payer: Self-pay | Admitting: Physician Assistant

## 2020-02-09 ENCOUNTER — Encounter: Payer: Self-pay | Admitting: Physical Therapy

## 2020-02-09 ENCOUNTER — Ambulatory Visit: Payer: 59 | Admitting: Physical Therapy

## 2020-02-09 ENCOUNTER — Other Ambulatory Visit: Payer: Self-pay

## 2020-02-09 DIAGNOSIS — M6281 Muscle weakness (generalized): Secondary | ICD-10-CM

## 2020-02-09 DIAGNOSIS — R42 Dizziness and giddiness: Secondary | ICD-10-CM

## 2020-02-09 DIAGNOSIS — R2681 Unsteadiness on feet: Secondary | ICD-10-CM

## 2020-02-09 DIAGNOSIS — R2689 Other abnormalities of gait and mobility: Secondary | ICD-10-CM

## 2020-02-09 DIAGNOSIS — R29818 Other symptoms and signs involving the nervous system: Secondary | ICD-10-CM

## 2020-02-09 DIAGNOSIS — R260 Ataxic gait: Secondary | ICD-10-CM
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
## 2020-02-10 ENCOUNTER — Encounter: Payer: Self-pay | Admitting: Family Medicine

## 2020-02-11 ENCOUNTER — Ambulatory Visit: Payer: 59 | Admitting: Physical Therapy

## 2020-02-11 ENCOUNTER — Other Ambulatory Visit: Payer: Self-pay

## 2020-02-11 DIAGNOSIS — R2681 Unsteadiness on feet: Secondary | ICD-10-CM

## 2020-02-11 DIAGNOSIS — M6281 Muscle weakness (generalized): Secondary | ICD-10-CM

## 2020-02-11 DIAGNOSIS — R2689 Other abnormalities of gait and mobility: Secondary | ICD-10-CM

## 2020-02-11 DIAGNOSIS — R29818 Other symptoms and signs involving the nervous system: Secondary | ICD-10-CM

## 2020-02-11 DIAGNOSIS — R260 Ataxic gait: Secondary | ICD-10-CM

## 2020-02-11 DIAGNOSIS — R42 Dizziness and giddiness: Secondary | ICD-10-CM

## 2020-02-11 NOTE — Therapy (Signed)
[facility] [address] [place], [place], [address] Phone: [phone]   Fax:  [phone]  Physical Therapy Treatment  Patient Details  Name: Marijayne Rauth MRN: [id number] Date of Birth: 06-May-1981 Referring Provider (PT): Lynne Leader, MD   Encounter Date: 02/11/2020  PT End of Session - 02/11/20 1845    Visit Number  17    Number of Visits  33    Date for PT Re-Evaluation  03/21/20    Authorization Type  [employer] - $40 copay - VL: 60 (PT/OT/ST combined) - now handled by [facility] - Visit Number  17    Authorization - Number of Visits  60    PT Start Time  3570    PT Stop Time  1528    PT Time Calculation (min)  41 min    Equipment Utilized During Treatment  Gait belt    Activity Tolerance  Patient tolerated treatment well    Behavior During Therapy  WFL for tasks assessed/performed       Past Medical History:  Diagnosis Date  . Ataxic gait   . Compression fracture of C-spine ([place]) 12/12/2019   C4-C5  . GERD (gastroesophageal reflux disease)   . Herniated disc, cervical   . Hypoglycemia   . Migraines     Past Surgical History:  Procedure Laterality Date  . ANKLE SURGERY    . DILATION AND CURETTAGE OF UTERUS    . WISDOM TOOTH EXTRACTION      There were no vitals filed for this visit.  Subjective Assessment - 02/11/20 1450    Subjective  Still hasn't reached out about aquatic therapy yet. No falls. Range of motion is better after the dry needling. No dizziness.    Patient is accompained by:  Family member   both parents   Pertinent History  PMH: hx of herniated disc in cervical spine    Diagnostic tests  MRI 2/13 cervical spine: No marrow edema to suggest acute cervical spine fracture. Cervical spondylosis as outlined. No more than mild spinal canalstenosis at any level. Multilevel neural foraminal narrowinggreatest on the left at C4-C5 and bilaterally at C5-C6(moderate/advanced at these  sites).MRI brain: Normal MRI appearance of the brain for age. No evidence of acuteintracranial abnormality    Patient Stated Goals  be able to walk and drive again to be more independent; to play golf and cornhole again    Currently in Pain?  Yes    Pain Score  3     Pain Location  Neck   base of neck   Pain Descriptors / Indicators  Aching                       [facility] Adult PT Treatment/Exercise - 02/11/20 0001      Ambulation/Gait   Ambulation/Gait  Yes    Ambulation/Gait Assistance  5: Supervision;4: Min guard    Ambulation/Gait Assistance Details  3 laps around gym with stepping over obstacles (black foam beams), over mats for compliant surface, figure 8 around cones, gait head turns and nods - pt needing closer min guard for gait with head turns and over compliant surfaces, but no LOB. Pt demonstrating a less narrow BOS with gait today. Ambulated approx. 300' outdoors with no AD over paved and grass surfaces, pt needing supervision over pavement and occasional min guard over grass. No LOB noted - just slower gait speed over grass.  Discussed with pt about walking into  therapy with no AD if she has someone with her for supervision, pt verbalized understanding. Additional lap of gait at end of session with head turns and scanning environment - pt demonstrating improved balance with head turns at end of session after practicing on a compliant surface next to the countertop.     Ambulation Distance (Feet)  750 Feet   approx   Assistive device  None    Gait Pattern  Step-through pattern;Narrow base of support    Ambulation Surface  Level;Unlevel;Indoor;Outdoor;Paved;Grass    Curb  5: Supervision    Curb Details (indicate cue type and reason)  x3 reps, no AD, initial cues for technique, no LOB      Neuro Re-ed    Neuro Re-ed Details   Standing on red/blue mat next to countertop with intermittent support: alternating forward reciprocal stepping over 5 hurdle obstacles of  different heights down and back x4 reps with close min guard for balance,walking with head turns with single fingertip/UE support on countertop, min guard for balance down and back 4 reps.       Vestibular Treatment/Exercise - 02/11/20 1520      Vestibular Treatment/Exercise   Vestibular Treatment Provided  Gaze      X1 Viewing Horizontal   Foot Position  standing with BUE support, then fingertip and then no UE support    Reps  2    Comments  x30 seconds with UE support, 3 reps without UE support pt only able to perform for 15 seconds before feeling wobbly, but no dizziness      X1 Viewing Vertical   Foot Position  standing with BUE support    Reps  2    Comments  30 seconds 4/10 dizziness after 1st and 2nd rep               PT Short Term Goals - 02/01/20 2049      PT SHORT TERM GOAL #1   Title  Pt will undergo assessment of gait speed with no AD. ALL STGS DUE 02/29/20    Time  4    Period  Weeks    Status  Partially Met    Target Date  02/29/20      PT SHORT TERM GOAL #2   Title  Pt will decrease TUG time to 14 seconds or less with no AD in order to demo decr fall risk.    Time  4    Period  Weeks    Status  New      PT SHORT TERM GOAL #3   Title  Pt will ambulate at least 230' with no AD with supervision in order to improve functional mobility.    Time  4    Period  Weeks    Status  New      PT SHORT TERM GOAL #4   Title  Pt will improve gait speed with RW to at least 2.8 ft/sec in order to improve community mobility.    Baseline  13.54 seconds = 2.42 ft/sec on 02/01/20    Time  4    Period  Weeks    Status  Revised      PT SHORT TERM GOAL #5   Title  Pt will improve B cervical rotation AROM to at least 45 degrees with mild pain in order to improve functional mobility.    Baseline  30 degrees to R, 35 degrees to L.    Time  4    Period  Weeks    Status  Revised      Additional Short Term Goals   Additional Short Term Goals  Yes      PT SHORT TERM GOAL  #6   Title  Pt will improve BERG score to at least 35/56 in order to demo decreased fall risk.    Baseline  14/56 on 12/23/19    Time  4    Period  Weeks    Status  New        PT Long Term Goals - 02/01/20 2052      PT LONG TERM GOAL #1   Title  Pt will be independent with final HEP in order to build upon functional gains made in therapy. ALL LTGS DUE 03/21/20    Time  12    Period  Weeks    Status  New      PT LONG TERM GOAL #2   Title  Pt will ambulate at least 300' with LRAD with supervision over level and unlevel surfaces in order to improve community mobility.    Time  12    Period  Weeks    Status  New      PT LONG TERM GOAL #3   Title  Pt will improve BERG score to at least a 45/56 in order to determine decr fall risk.    Baseline  14/56 on 12/23/19    Time  12    Period  Weeks    Status  Revised      PT LONG TERM GOAL #4   Title  Pt will improve B cervical rotation AROM to 65 degrees for functional ROM for driving.    Baseline  R rotation 43 degrees, L rotation 25 degrees    Time  12    Period  Weeks    Status  New      PT LONG TERM GOAL #5   Title  Pt will decrease TUG time to </= 13.5 seconds without use of AD    Baseline  20.22 seconds with RW    Time  12    Period  Weeks    Status  Revised      PT LONG TERM GOAL #6   Title  Pt will improve gait speed with LRAD to at least 2.7 ft/sec in order to decr fall risk and improve community mobility.    Time  12    Period  Weeks    Status  New      PT LONG TERM GOAL #7   Title  Pt will demonstrate decreased motion sensitivity as indicated by ability to perform bending down to the floor, performing head turns/nods and body turns without any dizziness; pt will also improve use of vestibular system as indicated by ability to maintain 30 seconds on each condition of the MCTSIB.    Baseline  lightheaded-mild dizziness on MSQ; 4 seconds on conditions 2,3,4; 30 seconds on condition 1 but with increased use of ankle  strategy    Time  12    Period  Weeks    Status  New      PT LONG TERM GOAL #8   Title  Pt will demonstrate ability to stand without support x 10 minutes to play corn hole and demonstrate ability to hit whiffle golf balls in grass x 10 reps with supervision    Time  12    Period  Weeks    Status  New  Plan - 02/11/20 1853    Clinical Impression Statement  Pt able to increase ambulation distance today with no AD - able to walk outside with no AD, only needing supervision on paved surfaces and min guard on grass. Pt most challenged with gait while performing head turns, needing min guard for balance, no dizziness noted. Discussed with pt ambulating into clinic with no AD with supervision of who is bringing her in, pt verbalized understanding. Pt with 4/10 dizziness today with x1 viewing in vertical direction with BUE support, tried to progress VOR x1  horizontal in standing with no UE support - pt only able to perform for 15 seconds before feeling wobbly, but no dizziness. Will continue to progress towards LTGs.    Personal Factors and Comorbidities  Past/Current Experience;Profession    Examination-Activity Limitations  Bend;Carry;Dressing;Locomotion Level;Squat;Stairs;Lift;Stand;Transfers;Hygiene/Grooming;Bathing    Examination-Participation Restrictions  Cleaning;Community Activity;Driving;Shop;Laundry    Stability/Clinical Decision Making  Evolving/Moderate complexity    Rehab Potential  Good    PT Frequency  Other (comment)   3x/week x 8, 2x/week x 4   PT Duration  12 weeks    PT Treatment/Interventions  ADLs/Self Care Home Management;Canalith Repostioning;DME Instruction;Gait training;Stair training;Functional mobility training;Neuromuscular re-education;Balance training;Therapeutic exercise;Therapeutic activities;Patient/family education;Dry needling;Passive range of motion;Energy conservation;Vestibular;Visual/perceptual remediation/compensation    PT Next Visit Plan  One  more TDN to R upper trap if needed - I asked her to call her insurance and ask about aquatic CPT [phone]; progress x1 viewing in standing by increasing time and decreasing UE support; continue gait with no AD around clinic. needs hip ABD strengthening. Safety checks intermittently.  any aquatic therapy? eyes closed balance, balance on compliant surfaces. Wants to be able to play golf and cornhole    Consulted and Agree with Plan of Care  Patient       Patient will benefit from skilled therapeutic intervention in order to improve the following deficits and impairments:  Abnormal gait, Decreased activity tolerance, Decreased balance, Decreased coordination, Difficulty walking, Decreased strength, Dizziness, Hypomobility, Increased muscle spasms, Pain, Decreased mobility  Visit Diagnosis: Other abnormalities of gait and mobility  Other symptoms and signs involving the nervous system  Ataxic gait  Muscle weakness (generalized)  Dizziness and giddiness  Unsteadiness on feet     Problem List Patient Active Problem List   Diagnosis Date Noted  . Vitamin D deficiency 01/26/2020  . B12 deficiency 01/25/2020  . Anxiety and depression 01/19/2020  . Alcohol use 01/19/2020  . Concussion with loss of consciousness 01/03/2020  . Thyroid nodule 01/03/2020  . Irregular menses 04/28/2019  . Neuropathy 04/28/2019  . Infertility counseling 10/01/2017  . DDD (degenerative disc disease), cervical 11/29/2016  . Hypoglycemia 11/29/2016  . Kidney stone 11/29/2016  . Sinusitis 11/29/2016  . Insomnia 03/08/2016  . Neck pain 03/08/2016  . 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 [place], [place], [address] Phone: [phone]   Fax:  [phone]  Name: Halea Lieb MRN: [id number] Date of Birth: 09/15/1981

## 2020-02-14 ENCOUNTER — Ambulatory Visit: Payer: 59 | Admitting: Physical Therapy

## 2020-02-14 ENCOUNTER — Encounter: Payer: Self-pay | Admitting: Physical Therapy

## 2020-02-14 ENCOUNTER — Other Ambulatory Visit: Payer: Self-pay

## 2020-02-14 DIAGNOSIS — R260 Ataxic gait: Secondary | ICD-10-CM

## 2020-02-14 DIAGNOSIS — M6281 Muscle weakness (generalized): Secondary | ICD-10-CM

## 2020-02-14 DIAGNOSIS — R29818 Other symptoms and signs involving the nervous system: Secondary | ICD-10-CM

## 2020-02-14 DIAGNOSIS — R2689 Other abnormalities of gait and mobility: Secondary | ICD-10-CM

## 2020-02-14 DIAGNOSIS — R2681 Unsteadiness on feet: Secondary | ICD-10-CM

## 2020-02-14 DIAGNOSIS — R42 Dizziness and giddiness: Secondary | ICD-10-CM

## 2020-02-15 ENCOUNTER — Ambulatory Visit: Payer: 59 | Admitting: Physician Assistant

## 2020-02-15 NOTE — Therapy (Signed)
[facility] [address] [place], [place], [address] Phone: [phone]   Fax:  [phone]  Physical Therapy Treatment  Patient Details  Name: Jane Hendricks MRN: [id number] Date of Birth: 08/03/1981 Referring Provider (PT): Lynne Leader, MD   Encounter Date: 02/14/2020  PT End of Session - 02/15/20 1518    Visit Number  18    Number of Visits  33    Date for PT Re-Evaluation  03/21/20    Authorization Type  [employer] - $40 copay - VL: 60 (PT/OT/ST combined) - now handled by [facility] - Visit Number  18    Authorization - Number of Visits  60    PT Start Time  7253    PT Stop Time  1358    PT Time Calculation (min)  42 min    Equipment Utilized During Treatment  Gait belt    Activity Tolerance  Patient tolerated treatment well    Behavior During Therapy  WFL for tasks assessed/performed       Past Medical History:  Diagnosis Date  . Ataxic gait   . Compression fracture of C-spine ([place]) 12/12/2019   C4-C5  . GERD (gastroesophageal reflux disease)   . Herniated disc, cervical   . Hypoglycemia   . Migraines     Past Surgical History:  Procedure Laterality Date  . ANKLE SURGERY    . DILATION AND CURETTAGE OF UTERUS    . WISDOM TOOTH EXTRACTION      There were no vitals filed for this visit.  Subjective Assessment - 02/14/20 1320    Subjective  No fals. Dizziness has been ok. Now taking cymbalta at night - has been exhausted    Patient is accompained by:  Family member   both parents   Pertinent History  PMH: hx of herniated disc in cervical spine    Diagnostic tests  MRI 2/13 cervical spine: No marrow edema to suggest acute cervical spine fracture. Cervical spondylosis as outlined. No more than mild spinal canalstenosis at any level. Multilevel neural foraminal narrowinggreatest on the left at C4-C5 and bilaterally at C5-C6(moderate/advanced at these sites).MRI brain: Normal MRI appearance of the  brain for age. No evidence of acuteintracranial abnormality    Patient Stated Goals  be able to walk and drive again to be more independent; to play golf and cornhole again    Currently in Pain?  Yes    Pain Score  3     Pain Location  Neck    Pain Descriptors / Indicators  Sore                       [facility] Adult PT Treatment/Exercise - 02/15/20 0001      Transfers   Comments  From standard mat height and no UE support: Sit to stands on foam x5 reps, then on red foam beam x5 reps with focus on eccentric control, sit <> stands with eyes closed on level ground x 5 reps and then on foam x5 reps for increased vestibular input       Ambulation/Gait   Ambulation/Gait  Yes    Ambulation/Gait Assistance  5: Supervision    Ambulation/Gait Assistance Details  cues to relax BUE for more reciprocal arm swing, no LOB noted throughout session, plus additional clinic distances between activities    Ambulation Distance (Feet)  230 Feet    Assistive device  None    Gait Pattern  Step-through pattern;Narrow  base of support    Ambulation Surface  Level;Indoor      Exercises   Exercises  Other Exercises    Other Exercises   Standing at edge of mat with UE support on chair: 1 x 10 reps resisted side steps with green band around distal thighs, side stepping at countertop down and back 4 times with green band around distal thighs with no UE support, cues for technique          02/14/20 0001  X1 Viewing Horizontal  Foot Position standing with BUE support and then no UE support  Reps 1  Comments x30 seconds, 2 x 30 seconds no UE support - no symptoms  X1 Viewing Vertical  Foot Position standing with BUE support  Reps 1  Comments x30 seconds - no symptoms, 2 x 20 seconds - no dizziness or pain, just incr muscle guarding        Balance Exercises - 02/14/20 1358      Balance Exercises: Standing   Other Standing Exercises  Feet hip width distance and eyes closed: 3 x 5 reps head turns  (pt with 2-3/10 dizziness after first 2 reps, none after last rep), 3 x 5 reps head nods - no dizziness. On foam with feet together and eyes closed: 3 x 30 seconds          PT Short Term Goals - 02/01/20 2049      PT SHORT TERM GOAL #1   Title  Pt will undergo assessment of gait speed with no AD. ALL STGS DUE 02/29/20    Time  4    Period  Weeks    Status  Partially Met    Target Date  02/29/20      PT SHORT TERM GOAL #2   Title  Pt will decrease TUG time to 14 seconds or less with no AD in order to demo decr fall risk.    Time  4    Period  Weeks    Status  New      PT SHORT TERM GOAL #3   Title  Pt will ambulate at least 230' with no AD with supervision in order to improve functional mobility.    Time  4    Period  Weeks    Status  New      PT SHORT TERM GOAL #4   Title  Pt will improve gait speed with RW to at least 2.8 ft/sec in order to improve community mobility.    Baseline  13.54 seconds = 2.42 ft/sec on 02/01/20    Time  4    Period  Weeks    Status  Revised      PT SHORT TERM GOAL #5   Title  Pt will improve B cervical rotation AROM to at least 45 degrees with mild pain in order to improve functional mobility.    Baseline  30 degrees to R, 35 degrees to L.    Time  4    Period  Weeks    Status  Revised      Additional Short Term Goals   Additional Short Term Goals  Yes      PT SHORT TERM GOAL #6   Title  Pt will improve BERG score to at least 35/56 in order to demo decreased fall risk.    Baseline  14/56 on 12/23/19    Time  4    Period  Weeks    Status  New  PT Long Term Goals - 02/01/20 2052      PT LONG TERM GOAL #1   Title  Pt will be independent with final HEP in order to build upon functional gains made in therapy. ALL LTGS DUE 03/21/20    Time  12    Period  Weeks    Status  New      PT LONG TERM GOAL #2   Title  Pt will ambulate at least 300' with LRAD with supervision over level and unlevel surfaces in order to improve community  mobility.    Time  12    Period  Weeks    Status  New      PT LONG TERM GOAL #3   Title  Pt will improve BERG score to at least a 45/56 in order to determine decr fall risk.    Baseline  14/56 on 12/23/19    Time  12    Period  Weeks    Status  Revised      PT LONG TERM GOAL #4   Title  Pt will improve B cervical rotation AROM to 65 degrees for functional ROM for driving.    Baseline  R rotation 43 degrees, L rotation 25 degrees    Time  12    Period  Weeks    Status  New      PT LONG TERM GOAL #5   Title  Pt will decrease TUG time to </= 13.5 seconds without use of AD    Baseline  20.22 seconds with RW    Time  12    Period  Weeks    Status  Revised      PT LONG TERM GOAL #6   Title  Pt will improve gait speed with LRAD to at least 2.7 ft/sec in order to decr fall risk and improve community mobility.    Time  12    Period  Weeks    Status  New      PT LONG TERM GOAL #7   Title  Pt will demonstrate decreased motion sensitivity as indicated by ability to perform bending down to the floor, performing head turns/nods and body turns without any dizziness; pt will also improve use of vestibular system as indicated by ability to maintain 30 seconds on each condition of the MCTSIB.    Baseline  lightheaded-mild dizziness on MSQ; 4 seconds on conditions 2,3,4; 30 seconds on condition 1 but with increased use of ankle strategy    Time  12    Period  Weeks    Status  New      PT LONG TERM GOAL #8   Title  Pt will demonstrate ability to stand without support x 10 minutes to play corn hole and demonstrate ability to hit whiffle golf balls in grass x 10 reps with supervision    Time  12    Period  Weeks    Status  New            Plan - 02/15/20 1523    Clinical Impression Statement  Pt able to ambulate throughout session without AD with supervision with no LOB. Able to progress VOR x1 today in standing with no UE support for 30 seconds in horizontal direction, pt limited in  vertical direction due to increased muscle guarding. Pt able to perform standing on foam with feet together and eyes closed for 30 seconds today, indicating improved vestibular input for balance. Will continue to progress towards LTGs.  Personal Factors and Comorbidities  Past/Current Experience;Profession    Examination-Activity Limitations  Bend;Carry;Dressing;Locomotion Level;Squat;Stairs;Lift;Stand;Transfers;Hygiene/Grooming;Bathing    Examination-Participation Restrictions  Cleaning;Community Activity;Driving;Shop;Laundry    Stability/Clinical Decision Making  Evolving/Moderate complexity    Rehab Potential  Good    PT Frequency  Other (comment)   3x/week x 8, 2x/week x 4   PT Duration  12 weeks    PT Treatment/Interventions  ADLs/Self Care Home Management;Canalith Repostioning;DME Instruction;Gait training;Stair training;Functional mobility training;Neuromuscular re-education;Balance training;Therapeutic exercise;Therapeutic activities;Patient/family education;Dry needling;Passive range of motion;Energy conservation;Vestibular;Visual/perceptual remediation/compensation    PT Next Visit Plan  One more TDN to R upper trap if needed - I asked her to call her insurance and ask about aquatic CPT [phone]; progress x1 viewing in standing by increasing time and decreasing UE support; continue gait with no AD around clinic. needs hip ABD strengthening. Safety checks intermittently.  any aquatic therapy? eyes closed balance, balance on compliant surfaces. Wants to be able to play golf and cornhole    Consulted and Agree with Plan of Care  Patient       Patient will benefit from skilled therapeutic intervention in order to improve the following deficits and impairments:  Abnormal gait, Decreased activity tolerance, Decreased balance, Decreased coordination, Difficulty walking, Decreased strength, Dizziness, Hypomobility, Increased muscle spasms, Pain, Decreased mobility  Visit Diagnosis: Other  abnormalities of gait and mobility  Other symptoms and signs involving the nervous system  Ataxic gait  Muscle weakness (generalized)  Dizziness and giddiness  Unsteadiness on feet     Problem List Patient Active Problem List   Diagnosis Date Noted  . Vitamin D deficiency 01/26/2020  . B12 deficiency 01/25/2020  . Anxiety and depression 01/19/2020  . Alcohol use 01/19/2020  . Concussion with loss of consciousness 01/03/2020  . Thyroid nodule 01/03/2020  . Irregular menses 04/28/2019  . Neuropathy 04/28/2019  . Infertility counseling 10/01/2017  . DDD (degenerative disc disease), cervical 11/29/2016  . Hypoglycemia 11/29/2016  . Kidney stone 11/29/2016  . Sinusitis 11/29/2016  . Insomnia 03/08/2016  . Neck pain 03/08/2016  . Parasomnia 03/08/2016    Arliss Journey , PT, DPT  02/15/2020, 3:26 PM  [facility] [address] [place], [place], [address] Phone: [phone]   Fax:  [phone]  Name: Jane Hendricks MRN: [id number] Date of Birth: 01/04/1981

## 2020-02-16 ENCOUNTER — Other Ambulatory Visit: Payer: Self-pay

## 2020-02-16 ENCOUNTER — Ambulatory Visit: Payer: 59 | Admitting: Physical Therapy

## 2020-02-16 DIAGNOSIS — R260 Ataxic gait: Secondary | ICD-10-CM

## 2020-02-16 DIAGNOSIS — R29818 Other symptoms and signs involving the nervous system: Secondary | ICD-10-CM

## 2020-02-16 DIAGNOSIS — M6281 Muscle weakness (generalized): Secondary | ICD-10-CM

## 2020-02-16 DIAGNOSIS — R2689 Other abnormalities of gait and mobility: Secondary | ICD-10-CM

## 2020-02-17 ENCOUNTER — Other Ambulatory Visit: Payer: Self-pay | Admitting: Family Medicine

## 2020-02-17 NOTE — Therapy (Signed)
[facility] [address] [place], [place], [address] Phone: [phone]   Fax:  [phone]  Physical Therapy Treatment  Patient Details  Name: Jane Hendricks MRN: [id number] Date of Birth: [birth year] Referring Provider (PT): Lynne Leader, MD   Encounter Date: 02/16/2020  PT End of Session - 02/17/20 0823    Visit Number  19    Number of Visits  33    Date for PT Re-Evaluation  03/21/20    Authorization Type  [employer] - $40 copay - VL: 60 (PT/OT/ST combined) - now handled by [facility] - Visit Number  19    Authorization - Number of Visits  60    PT Start Time  2919    PT Stop Time  1314    PT Time Calculation (min)  43 min    Equipment Utilized During Treatment  Gait belt    Activity Tolerance  Patient tolerated treatment well    Behavior During Therapy  WFL for tasks assessed/performed       Past Medical History:  Diagnosis Date  . Ataxic gait   . Compression fracture of C-spine ([facility]) 12/12/2019   C4-C5  . GERD (gastroesophageal reflux disease)   . Herniated disc, cervical   . Hypoglycemia   . Migraines     Past Surgical History:  Procedure Laterality Date  . ANKLE SURGERY    . DILATION AND CURETTAGE OF UTERUS    . WISDOM TOOTH EXTRACTION      There were no vitals filed for this visit.  Subjective Assessment - 02/16/20 1235    Subjective  Thinks this week is the last weekly B12 and then it will go to monthly. Walks in today with no RW. Sees Inda Coke (PA) on the 20th. No falls. Drove a little bit yesterday - about 2 miles and it went well. Was much more uncomfortable driving home in the dark.    Patient is accompained by:  Family member   both parents   Pertinent History  PMH: hx of herniated disc in cervical spine    Diagnostic tests  MRI 2/13 cervical spine: No marrow edema to suggest acute cervical spine fracture. Cervical spondylosis as outlined. No more than mild spinal  canalstenosis at any level. Multilevel neural foraminal narrowinggreatest on the left at C4-C5 and bilaterally at C5-C6(moderate/advanced at these sites).MRI brain: Normal MRI appearance of the brain for age. No evidence of acuteintracranial abnormality    Patient Stated Goals  be able to walk and drive again to be more independent; to play golf and cornhole again    Currently in Pain?  No/denies                       [facility] Adult PT Treatment/Exercise - 02/17/20 0834      Ambulation/Gait   Ambulation/Gait  Yes    Ambulation/Gait Assistance  5: Supervision;4: Min guard    Ambulation/Gait Assistance Details  pt ambulated into clinic today without a RW - when receiving pt from waiting from pt initially holding onto the wall for balance, once given cues to relax BUE pt able to demonstrate a more reciprocal gait pattern with improved balance - only needing supervision for majority of session over indoor level and outdoor paved surfaces. When ambulating on grass pt needing min guard when asking pt to scan environment and perform head motions     Ambulation Distance (Feet)  230 Feet   x2 indoors, plus  300' outdoors   Assistive device  None    Gait Pattern  Step-through pattern;Narrow base of support    Ambulation Surface  Level;Indoor    Stairs  Yes    Stairs Assistance  5: Supervision    Stairs Assistance Details (indicate cue type and reason)  First set ascending with single handrail, performed 2nd set with no handrail ascending with supervision. Still descending holding onto single handrail due to balance deficits and hip weakness     Stair Management Technique  No rails;One rail Right;Alternating pattern;Forwards    Number of Stairs  8    Height of Stairs  6      Therapeutic Activites    Therapeutic Activities  Other Therapeutic Activities    Other Therapeutic Activities  Indoors: putting x4 reps with golf ball into open crate, then additional 3 reps standing on blue mat with no  LOB, progressed to performing outdoors x6 reps in grass with no LOB,  pt also performing a "sand wedge" swing with bigger ROM x3 reps with no LOB, but did have difficulty hitting the ball with first 2 attempts. Discussed with pt being able to swing at home with husband supervision.       Exercises   Exercises  Other Exercises    Other Exercises   Standing on foam at bottom of staircase:  x5 reps B alternating step taps to single step (just tapping toe) and then progressing to single tap to bottom step and then 2nd step x7 reps - min guard for balance       Knee/Hip Exercises: Aerobic   Tread Mill  Pt asking about walking on her treadmill at home in the garage, showed pt set up and proper speed (slightly more than pt's normal gait speed) - level 1.5 for 3:30 while holding onto B handrails. Discussed pt beginning to walk on treadmill at home with supervision, pt verbalized understanding.       Knee/Hip Exercises: Standing   Lateral Step Up  Both;1 set;10 reps;Hand Hold: 1    Lateral Step Up Limitations  pt gently tapping with opposite leg to step, attempted trying to "float" non stance leg    Forward Step Up  Both;1 set;10 reps;Hand Hold: 1;Step Height: 6"    Forward Step Up Limitations  intermittent UE support, cues to "float" non-stance leg for additional SLS              PT Education - 02/17/20 0823    Education Details  beginning to use treadmill at home, practicing putting outside with husband's supervision    Person(s) Educated  Patient    Methods  Explanation    Comprehension  Verbalized understanding       PT Short Term Goals - 02/01/20 2049      PT SHORT TERM GOAL #1   Title  Pt will undergo assessment of gait speed with no AD. ALL STGS DUE 02/29/20    Time  4    Period  Weeks    Status  Partially Met    Target Date  02/29/20      PT SHORT TERM GOAL #2   Title  Pt will decrease TUG time to 14 seconds or less with no AD in order to demo decr fall risk.    Time  4     Period  Weeks    Status  New      PT SHORT TERM GOAL #3   Title  Pt will ambulate at least 230'   with no AD with supervision in order to improve functional mobility.    Time  4    Period  Weeks    Status  New      PT SHORT TERM GOAL #4   Title  Pt will improve gait speed with RW to at least 2.8 ft/sec in order to improve community mobility.    Baseline  13.54 seconds = 2.42 ft/sec on 02/01/20    Time  4    Period  Weeks    Status  Revised      PT SHORT TERM GOAL #5   Title  Pt will improve B cervical rotation AROM to at least 45 degrees with mild pain in order to improve functional mobility.    Baseline  30 degrees to R, 35 degrees to L.    Time  4    Period  Weeks    Status  Revised      Additional Short Term Goals   Additional Short Term Goals  Yes      PT SHORT TERM GOAL #6   Title  Pt will improve BERG score to at least 35/56 in order to demo decreased fall risk.    Baseline  14/56 on 12/23/19    Time  4    Period  Weeks    Status  New        PT Long Term Goals - 02/01/20 2052      PT LONG TERM GOAL #1   Title  Pt will be independent with final HEP in order to build upon functional gains made in therapy. ALL LTGS DUE 03/21/20    Time  12    Period  Weeks    Status  New      PT LONG TERM GOAL #2   Title  Pt will ambulate at least 300' with LRAD with supervision over level and unlevel surfaces in order to improve community mobility.    Time  12    Period  Weeks    Status  New      PT LONG TERM GOAL #3   Title  Pt will improve BERG score to at least a 45/56 in order to determine decr fall risk.    Baseline  14/56 on 12/23/19    Time  12    Period  Weeks    Status  Revised      PT LONG TERM GOAL #4   Title  Pt will improve B cervical rotation AROM to 65 degrees for functional ROM for driving.    Baseline  R rotation 43 degrees, L rotation 25 degrees    Time  12    Period  Weeks    Status  New      PT LONG TERM GOAL #5   Title  Pt will decrease TUG time to  </= 13.5 seconds without use of AD    Baseline  20.22 seconds with RW    Time  12    Period  Weeks    Status  Revised      PT LONG TERM GOAL #6   Title  Pt will improve gait speed with LRAD to at least 2.7 ft/sec in order to decr fall risk and improve community mobility.    Time  12    Period  Weeks    Status  New      PT LONG TERM GOAL #7   Title  Pt will demonstrate decreased motion sensitivity as indicated by ability   to perform bending down to the floor, performing head turns/nods and body turns without any dizziness; pt will also improve use of vestibular system as indicated by ability to maintain 30 seconds on each condition of the MCTSIB.    Baseline  lightheaded-mild dizziness on MSQ; 4 seconds on conditions 2,3,4; 30 seconds on condition 1 but with increased use of ankle strategy    Time  12    Period  Weeks    Status  New      PT LONG TERM GOAL #8   Title  Pt will demonstrate ability to stand without support x 10 minutes to play corn hole and demonstrate ability to hit whiffle golf balls in grass x 10 reps with supervision    Time  12    Period  Weeks    Status  New            Plan - 02/17/20 0825    Clinical Impression Statement  Pt able to ambulate throughout session with no AD with supervision - including outdoors on paved surfaces with no LOB. Pt needing min guard when ambulating over grass when scanning environment and performing head turns, especially from R/L. Practiced putting today indoors and outdoors over grass, with pt demonstrating no LOB and reports of no dizziness/pain - discussed with pt about beginning to practice putting at home outdoors in yard. Pt able to progress stairs today with performing step over step pattern ascending with no handrails, still needs handrails while descending. Pt is progressing well, will continue to progress towards LTGs.    Personal Factors and Comorbidities  Past/Current Experience;Profession    Examination-Activity Limitations   Bend;Carry;Dressing;Locomotion Level;Squat;Stairs;Lift;Stand;Transfers;Hygiene/Grooming;Bathing    Examination-Participation Restrictions  Cleaning;Community Activity;Driving;Shop;Laundry    Stability/Clinical Decision Making  Evolving/Moderate complexity    Rehab Potential  Good    PT Frequency  Other (comment)   3x/week x 8, 2x/week x 4   PT Duration  12 weeks    PT Treatment/Interventions  ADLs/Self Care Home Management;Canalith Repostioning;DME Instruction;Gait training;Stair training;Functional mobility training;Neuromuscular re-education;Balance training;Therapeutic exercise;Therapeutic activities;Patient/family education;Dry needling;Passive range of motion;Energy conservation;Vestibular;Visual/perceptual remediation/compensation    PT Next Visit Plan  One more TDN to R upper trap if needed. any word on aquatics? pt stating she may be bringing her golf clubs in from home. progress x1 viewing in standing by increasing time and decreasing UE support; gait with no AD over compliant surfaces/obstacles/scanning environment, needs hip ABD strengthening. eyes closed balance on compliant surfaces Wants to be able to play golf and cornhole    Consulted and Agree with Plan of Care  Patient       Patient will benefit from skilled therapeutic intervention in order to improve the following deficits and impairments:  Abnormal gait, Decreased activity tolerance, Decreased balance, Decreased coordination, Difficulty walking, Decreased strength, Dizziness, Hypomobility, Increased muscle spasms, Pain, Decreased mobility  Visit Diagnosis: Other abnormalities of gait and mobility  Other symptoms and signs involving the nervous system  Muscle weakness (generalized)  Ataxic gait     Problem List Patient Active Problem List   Diagnosis Date Noted  . Vitamin D deficiency 01/26/2020  . B12 deficiency 01/25/2020  . Anxiety and depression 01/19/2020  . Alcohol use 01/19/2020  . Concussion with loss of  consciousness 01/03/2020  . Thyroid nodule 01/03/2020  . Irregular menses 04/28/2019  . Neuropathy 04/28/2019  . Infertility counseling 10/01/2017  . DDD (degenerative disc disease), cervical 11/29/2016  . Hypoglycemia 11/29/2016  . Kidney stone 11/29/2016  . Sinusitis 11/29/2016  .   Insomnia 03/08/2016  . Neck pain 03/08/2016  . 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   

## 2020-02-18 ENCOUNTER — Ambulatory Visit: Payer: 59 | Admitting: Physical Therapy

## 2020-02-18 ENCOUNTER — Other Ambulatory Visit: Payer: Self-pay

## 2020-02-18 DIAGNOSIS — R29818 Other symptoms and signs involving the nervous system: Secondary | ICD-10-CM

## 2020-02-20 NOTE — Therapy (Signed)
[facility] [address] [place], [place], [address] Phone: [phone]   Fax:  [phone]  Patient Details  Name: Jane Hendricks MRN: [id number] Date of Birth: 1980/11/23 Referring Provider:  Rodolph Bong, MD  Encounter Date: 02/18/2020     02/20/20 1434  Symptoms/Limitations  Subjective Patient received in check out area emotionally distraught and crying.  Pt willing to walk with therapist to a private treatment room.   Pt assisted into private treatment room with therapist.  Pt required hands on assistance to maintain balance today during ambulation with RW.  Pt continued to be very emotionally distraught.  PT allowed pt time to settle to be able to verbally explain why she was crying.  Pt had increased difficulty verbalizing current situation but was able to answer PT's questions.  Pt reported that her husband was out of town this weekend and that she is upset with her parents because "they don't trust me and they have been going through my stuff! But there is nothing there!  I just wish they would leave."  Pt continued to become emotionally distraught and began repeating her story.  Pt also demonstrated increased sway while sitting unsupported and difficulty holding objects in her hands.  Therapist suspected that pt was impaired; pt admitted to drinking last night but reported she had only been drinking water today while working.  Therapist did not feel pt was safe to treat today.  Continued to allow pt time to calm emotionally and provided pt with emotional support.  Pt apologized to therapist for taking up therapy time with her emotional struggles.  Pt escorted back to the waiting area with minimal assistance and RW due to ongoing balance impairments.    Parents shared concerns about patient's current state with PT.    Patient did exit building and got into car with parents.  A few minutes later PT was called out to the  parking lot because of an incident between the patient and parents.  Patient refusing to leave with parents and was noted to have an abrasion on her knee - PT did not witness how abrasion occurred but did offer first aid.  Due to safety concerns and patient's refusal to leave with parents, therapist assisted pt back into the therapy clinic and assisted pt with contacting her neighbor to come provide transportation home.  Pt left under the supervision of the rehab tech until neighbor arrived.    Dierdre Highman, PT, DPT 02/20/20    2:54 PM     [facility] [address] [place], [place], [address] Phone: [phone]   Fax:  [phone]

## 2020-02-21 ENCOUNTER — Ambulatory Visit: Payer: 59 | Admitting: Physical Therapy

## 2020-02-21 ENCOUNTER — Telehealth: Payer: Self-pay | Admitting: Family Medicine

## 2020-02-21 NOTE — Telephone Encounter (Signed)
Patient's mother called. She said that Jane Hendricks had rough end of last week and weekend. She has been drinking heavily and had another "emotional breakdown" when leaving PT last week.  She also called on Friday asking for Jane Hendricks to be seen by Dr Jane Hendricks (who was not in office) or Dr Jane Hendricks. I suggested contacting her PCP for this. She insisted that I talk to Dr Jane Hendricks to see if he would see Jane Hendricks. After speaking with Dr Jane Hendricks and Jane Hendricks's office (her PCP) they both suggested that she be seen in the ED for this issue. I informed the patient's mother and she said that this would most likely make things worse so they did not go.  Her mother asked if Dr Jane Hendricks would be willing to call her so that she can explain to him what has been going on. She also believes that the recent medication changes could be having adverse effects and would like his thoughts on that.  Her mother and father were staying with her but have since left but are still in the area because Jane Hendricks threatened to not come home if they were still there. Jane Hendricks does not know that she is calling to discuss any of this.    Her mother asked last week to schedule an appointment with Dr Jane Hendricks to be seen on Tuesday. I called Jane Hendricks to make sure she was aware of the appointment and to confirm that she still wanted to be seen. She did not know that this appointment was made and did not wist to come in at this time. She is already scheduled for a follow up with Jane Hendricks and said that she would just see her.   Mother: Jane Hendricks  [phone]

## 2020-02-22 ENCOUNTER — Ambulatory Visit: Payer: Self-pay | Admitting: Family Medicine

## 2020-02-22 ENCOUNTER — Ambulatory Visit: Payer: 59 | Admitting: Physician Assistant

## 2020-02-22 ENCOUNTER — Encounter: Payer: Self-pay | Admitting: Physician Assistant

## 2020-02-22 DIAGNOSIS — Z0289 Encounter for other administrative examinations: Secondary | ICD-10-CM

## 2020-02-22 NOTE — Progress Notes (Deleted)
Jane Hendricks is a 39 y.o. female is for a follow up.  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   HPI  B12 deficiency, anxiety & depression, concussion  Health Maintenance Due  Topic Date Due  . HIV Screening  Never done  . COVID-19 Vaccine (1) Never done    Past Medical History:  Diagnosis Date  . Ataxic gait   . Compression fracture of C-spine ([place]) 12/12/2019   C4-C5  . GERD (gastroesophageal reflux disease)   . Herniated disc, cervical   . Hypoglycemia   . Migraines      Social History   Socioeconomic History  . Marital status: Married    Spouse name: Not on file  . Number of children: Not on file  . Years of education: Not on file  . Highest education level: Not on file  Occupational History  . Not on file  Tobacco Use  . Smoking status: Never Smoker  . Smokeless tobacco: Never Used  Substance and Sexual Activity  . Alcohol use: Not Currently    Alcohol/week: 16.0 standard drinks    Types: 16 Shots of liquor per week    Comment: typically 4 shots 4days per week, Pt has stopped 12/2019  . Drug use: No  . Sexual activity: Yes    Birth control/protection: None  Other Topics Concern  . Not on file  Social History Narrative   Mudlogger of [place]   Married   No children   3 rottweilers   Social Determinants of Health   Financial Resource Strain:   . Difficulty of Paying Living Expenses:   Food Insecurity:   . Worried About Charity fundraiser in the Last Year:   . Arboriculturist in the Last Year:   Transportation Needs:   . Film/video editor (Medical):   Marland Kitchen Lack of Transportation (Non-Medical):   Physical Activity:   . Days of Exercise per Week:   . Minutes of Exercise per Session:   Stress:   . Feeling of Stress :   Social Connections:   . Frequency of Communication with Friends and Family:   . Frequency of Social Gatherings with  Friends and Family:   . Attends Religious Services:   . Active Member of Clubs or Organizations:   . Attends Archivist Meetings:   Marland Kitchen Marital Status:   Intimate Partner Violence:   . Fear of Current or Ex-Partner:   . Emotionally Abused:   Marland Kitchen Physically Abused:   . Sexually Abused:     Past Surgical History:  Procedure Laterality Date  . ANKLE SURGERY    . DILATION AND CURETTAGE OF UTERUS    . WISDOM TOOTH EXTRACTION      Family History  Problem Relation Age of Onset  . Osteoarthritis Mother   . Hyperlipidemia Mother   . Hypertension Mother   . Kidney disease Father   . Osteoarthritis Maternal Grandmother   . COPD Maternal Grandmother   . Osteoarthritis Maternal Grandfather   . Lung cancer Maternal Grandfather   . Diabetes Maternal Grandfather   . Hyperlipidemia Maternal Grandfather   . Hypertension Maternal Grandfather   . Osteoarthritis Paternal Grandmother   . COPD Paternal Grandmother   . Osteoarthritis Paternal Grandfather   . Colon cancer Paternal Grandfather   . Lung cancer Maternal Aunt     PMHx, SurgHx, SocialHx, FamHx, Medications,  and Allergies were reviewed in the Visit Navigator and updated as appropriate.   Patient Active Problem List   Diagnosis Date Noted  . Vitamin D deficiency 01/26/2020  . B12 deficiency 01/25/2020  . Anxiety and depression 01/19/2020  . Alcohol use 01/19/2020  . Concussion with loss of consciousness 01/03/2020  . Thyroid nodule 01/03/2020  . Irregular menses 04/28/2019  . Neuropathy 04/28/2019  . Infertility counseling 10/01/2017  . DDD (degenerative disc disease), cervical 11/29/2016  . Hypoglycemia 11/29/2016  . Kidney stone 11/29/2016  . Sinusitis 11/29/2016  . Insomnia 03/08/2016  . Neck pain 03/08/2016  . 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 Smoking status: Never Smoker  . Smokeless tobacco: Never Used  Substance Use Topics  . Alcohol use: Not Currently    Alcohol/week: 16.0  standard drinks    Types: 16 Shots of liquor per week    Comment: typically 4 shots 4days per week, Pt has stopped 12/2019  . Drug use: No    Current Medications and Allergies:    Current Outpatient Medications:  .  acetaminophen (TYLENOL) 500 MG tablet, Take 1,000 mg by mouth every 6 (six) hours as needed., Disp: , Rfl:  .  AMBULATORY NON FORMULARY MEDICATION, Walker  Disp 1 Use as needed. Ataxia R27.0, Disp: 1 each, Rfl: 0 .  B Complex Vitamins (B COMPLEX-B12 PO), Take 1 tablet by mouth daily., Disp: , Rfl:  .  busPIRone (BUSPAR) 5 MG tablet, Take 1 tablet (5 mg total) by mouth 3 (three) times daily., Disp: 90 tablet, Rfl: 0 .  cyanocobalamin (,VITAMIN B-12,) 1000 MCG/ML injection, 1000 mcg (1 mg) injection once per week for four weeks, followed by 1000 mcg injection once per month., Disp: 1 mL, Rfl: 15 .  DULoxetine (CYMBALTA) 30 MG capsule, Take 1 capsule (30 mg total) by mouth daily for 7 days, THEN 2 capsules (60 mg total) daily for 21 days., Disp: 49 capsule, Rfl: 0 .  famotidine (PEPCID) 10 MG tablet, Take 10 mg by mouth daily. , Disp: , Rfl:  .  HYDROcodone-acetaminophen (NORCO/VICODIN) 5-325 MG tablet, Take 1 tablet by mouth every 6 (six) hours as needed., Disp: 15 tablet, Rfl: 0 .  ibuprofen (ADVIL) 200 MG tablet, Take 400 mg by mouth every 6 (six) hours as needed., Disp: , Rfl:  .  lidocaine (LIDODERM) 5 %, Place 1 patch onto the skin daily. Remove & Discard patch within 12 hours or as directed by MD, Disp: , Rfl:  .  meclizine (ANTIVERT) 25 MG tablet, Take 1 tablet (25 mg total) by mouth 3 (three) times daily as needed for dizziness or nausea., Disp: 30 tablet, Rfl: 3 .  traZODone (DESYREL) 50 MG tablet, Take 1 tablet (50 mg total) by mouth at bedtime as needed for sleep., Disp: 90 tablet, Rfl: 1 .  VITAMIN D PO, Take 1 capsule by mouth daily., Disp: , Rfl:  .  Vitamin D, Ergocalciferol, (DRISDOL) 1.25 MG (50000 UNIT) CAPS capsule, Take 1 capsule (50,000 Units total) by mouth  every 7 (seven) days., Disp: 8 capsule, Rfl: 0  Allergies  Allergen Reactions  . Black Walnut Flavor     Walnuts-tongue swelling    Review of Systems   ROS  Vitals:  There were no vitals filed for this visit.   There is no height or weight on file to calculate BMI.   Physical Exam:    Physical Exam   Assessment and Plan:    There are no  diagnoses linked to this encounter.  . Reviewed expectations re: course of current medical issues. . Discussed self-management of symptoms. . Outlined signs and symptoms indicating need for more acute intervention. . Patient verbalized understanding and all questions were answered. . See orders for this visit as documented in the electronic medical record. . Patient received an After Visit Summary.  ***  Jarold Motto, PA-C [place] 02/22/2020  Follow-up: No follow-ups on file.

## 2020-02-23 ENCOUNTER — Encounter: Payer: Self-pay | Admitting: Physical Therapy

## 2020-02-23 ENCOUNTER — Telehealth: Payer: Self-pay | Admitting: Physical Therapy

## 2020-02-23 ENCOUNTER — Ambulatory Visit: Payer: 59 | Admitting: Physical Therapy

## 2020-02-23 NOTE — Telephone Encounter (Signed)
Attempted to contact patient today regarding missed visit on Monday and today.  No answer and voicemail full so unable to leave a voice message.  Will attempt to contact patient again.  Dierdre Highman, PT, DPT 02/23/20    1:08 PM

## 2020-02-25 ENCOUNTER — Ambulatory Visit: Payer: 59 | Admitting: Physical Therapy

## 2020-02-28 ENCOUNTER — Ambulatory Visit: Payer: 59 | Admitting: Physical Therapy

## 2020-02-29 ENCOUNTER — Ambulatory Visit: Payer: 59 | Admitting: Family Medicine

## 2020-03-01 ENCOUNTER — Ambulatory Visit: Payer: 59 | Admitting: Physical Therapy

## 2020-03-01 ENCOUNTER — Ambulatory Visit: Payer: 59 | Admitting: Speech Pathology

## 2020-03-02 ENCOUNTER — Encounter: Payer: Self-pay | Admitting: Physician Assistant

## 2020-03-03 ENCOUNTER — Ambulatory Visit: Payer: 59 | Admitting: Physical Therapy

## 2020-03-03 ENCOUNTER — Ambulatory Visit: Payer: 59 | Admitting: Family Medicine

## 2020-03-06 ENCOUNTER — Ambulatory Visit (INDEPENDENT_AMBULATORY_CARE_PROVIDER_SITE_OTHER): Payer: 59 | Admitting: Family Medicine

## 2020-03-06 ENCOUNTER — Encounter: Payer: Self-pay | Admitting: Family Medicine

## 2020-03-06 ENCOUNTER — Other Ambulatory Visit: Payer: Self-pay

## 2020-03-06 VITALS — BP 120/86 | HR 101 | Ht 65.5 in | Wt 130.0 lb

## 2020-03-06 DIAGNOSIS — S060X9D Concussion with loss of consciousness of unspecified duration, subsequent encounter: Secondary | ICD-10-CM

## 2020-03-06 DIAGNOSIS — F329 Major depressive disorder, single episode, unspecified: Secondary | ICD-10-CM | POA: Diagnosis not present

## 2020-03-06 DIAGNOSIS — F32A Depression, unspecified: Secondary | ICD-10-CM

## 2020-03-06 DIAGNOSIS — F419 Anxiety disorder, unspecified: Secondary | ICD-10-CM | POA: Diagnosis not present

## 2020-03-06 MED ORDER — BUPROPION HCL ER (XL) 150 MG PO TB24: 150.0000 mg | ORAL_TABLET | ORAL | 2 refills | Status: DC

## 2020-03-06 MED ORDER — FLUOXETINE HCL 20 MG PO CAPS: 60.0000 mg | ORAL_CAPSULE | Freq: Every day | ORAL | 2 refills | Status: DC

## 2020-03-06 MED ORDER — CLONAZEPAM 0.5 MG PO TABS: 0.5000 mg | ORAL_TABLET | Freq: Two times a day (BID) | ORAL | 1 refills | Status: DC

## 2020-03-06 NOTE — Patient Instructions (Addendum)
Thank you for coming in today. Plan to transition off Clymbalta and back to Prozac and Wellbutrin.  Do not take the cymbalta tonight.  Start Prozac 40mg   and Wellbutrin tomorrow.  Increase proazac to 60 mg in 2 days.  Take klonopin twice daily for anxiety starting tonight.  I will also refer to psychiatry.  Recheck in 1 month.  Keep me updated.   Let me know if this is not working.   This may act like a mild TBI.
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
## 2020-03-07 ENCOUNTER — Telehealth: Payer: Self-pay | Admitting: Family Medicine

## 2020-03-07 ENCOUNTER — Ambulatory Visit: Payer: 59 | Admitting: Physical Therapy

## 2020-03-07 DIAGNOSIS — F32A Depression, unspecified: Secondary | ICD-10-CM

## 2020-03-07 DIAGNOSIS — F329 Major depressive disorder, single episode, unspecified: Secondary | ICD-10-CM

## 2020-03-07 NOTE — Telephone Encounter (Signed)
Patient called stating that she reached out the [facility] to schedule with a psychiatrist. They told her the way that the referral was sent over, she would need to see a therapist virtually first. Then from there, they will decide if she needs to be seen by a psychiatrist or not. From her discussion with Dr Denyse Amass yesterday, she thinks that she would be fine to go straight to the psychiatrist without the therapy visit first. They can do it that way if the referral states so.  Can this be resent over for her if Dr Denyse Amass would like for her to just see the psychiatrist?  Please advise.  If you could call the patient back to let her know she would appreciate it Work # [phone] (she will be gone from 12:15 to 1:45 today).

## 2020-03-07 NOTE — Telephone Encounter (Signed)
Okay to proceed to psychiatry.  Do I need to change the referral?  Please check with with behavioral clinic.  And update patient.

## 2020-03-07 NOTE — Telephone Encounter (Signed)
Referral updated. Please contact [facility] and with update

## 2020-03-07 NOTE — Telephone Encounter (Signed)
Yes please, the referral needs to state that patient needs to be seen by psychiatry so they can go ahead and skip the therapist part.

## 2020-03-08 NOTE — Telephone Encounter (Signed)
Faxed new referral over to [facility]

## 2020-03-10 ENCOUNTER — Encounter: Payer: Self-pay | Admitting: Physical Therapy

## 2020-03-10 ENCOUNTER — Ambulatory Visit: Payer: 59 | Attending: Family Medicine | Admitting: Physical Therapy

## 2020-03-10 ENCOUNTER — Other Ambulatory Visit: Payer: Self-pay

## 2020-03-10 DIAGNOSIS — M6281 Muscle weakness (generalized): Secondary | ICD-10-CM | POA: Diagnosis present

## 2020-03-10 DIAGNOSIS — R29818 Other symptoms and signs involving the nervous system: Secondary | ICD-10-CM | POA: Insufficient documentation

## 2020-03-10 DIAGNOSIS — R260 Ataxic gait: Secondary | ICD-10-CM | POA: Diagnosis present

## 2020-03-10 DIAGNOSIS — R42 Dizziness and giddiness: Secondary | ICD-10-CM | POA: Diagnosis present

## 2020-03-10 DIAGNOSIS — R2689 Other abnormalities of gait and mobility: Secondary | ICD-10-CM | POA: Diagnosis present

## 2020-03-10 DIAGNOSIS — R2681 Unsteadiness on feet: Secondary | ICD-10-CM | POA: Insufficient documentation

## 2020-03-10 NOTE — Addendum Note (Signed)
Addended by: Dierdre Highman on: 03/10/2020 05:18 PM   Modules accepted: Orders
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
## 2020-03-13 ENCOUNTER — Other Ambulatory Visit: Payer: Self-pay

## 2020-03-13 ENCOUNTER — Encounter: Payer: Self-pay | Admitting: Physician Assistant

## 2020-03-13 ENCOUNTER — Ambulatory Visit: Payer: 59 | Admitting: Physical Therapy

## 2020-03-13 ENCOUNTER — Ambulatory Visit: Payer: 59 | Admitting: Physician Assistant

## 2020-03-13 VITALS — BP 124/82 | HR 83 | Temp 98.2°F | Ht 65.5 in | Wt 134.6 lb

## 2020-03-13 DIAGNOSIS — M6281 Muscle weakness (generalized): Secondary | ICD-10-CM

## 2020-03-13 DIAGNOSIS — R29818 Other symptoms and signs involving the nervous system: Secondary | ICD-10-CM

## 2020-03-13 DIAGNOSIS — F419 Anxiety disorder, unspecified: Secondary | ICD-10-CM | POA: Diagnosis not present

## 2020-03-13 DIAGNOSIS — F329 Major depressive disorder, single episode, unspecified: Secondary | ICD-10-CM | POA: Diagnosis not present

## 2020-03-13 DIAGNOSIS — R2689 Other abnormalities of gait and mobility: Secondary | ICD-10-CM

## 2020-03-13 DIAGNOSIS — R260 Ataxic gait: Secondary | ICD-10-CM

## 2020-03-13 MED ORDER — TRAZODONE HCL 50 MG PO TABS: 100.0000 mg | ORAL_TABLET | Freq: Every evening | ORAL | 1 refills | Status: DC | PRN

## 2020-03-13 MED ORDER — FLUOXETINE HCL 20 MG PO CAPS
40.0000 mg | ORAL_CAPSULE | Freq: Every day | ORAL | 2 refills | Status: DC
Start: 2020-03-13 — End: 2020-04-19

## 2020-03-13 MED ORDER — BUSPIRONE HCL 5 MG PO TABS: 5.0000 mg | ORAL_TABLET | Freq: Three times a day (TID) | ORAL | 0 refills | Status: DC

## 2020-03-13 MED ORDER — VITAMIN D (ERGOCALCIFEROL) 1.25 MG (50000 UNIT) PO CAPS: 50000.0000 [IU] | ORAL_CAPSULE | ORAL | 0 refills | Status: DC

## 2020-03-13 NOTE — Therapy (Signed)
[facility] [address] [place], [place], [address] Phone: [phone]   Fax:  [phone]  Physical Therapy Treatment  Patient Details  Name: Jane Hendricks MRN: [id number] Date of Birth: [birth year] Referring Provider (PT): Clementeen Graham, MD   Encounter Date: 03/13/2020  PT End of Session - 03/13/20 1559    Visit Number  21    Number of Visits  36    Date for PT Re-Evaluation  05/09/20    Authorization Type  [employer] - $40 copay - VL: 60 (PT/OT/ST combined)    Authorization - Visit Number  21    Authorization - Number of Visits  60    PT Start Time  1233    PT Stop Time  1318    PT Time Calculation (min)  45 min    Equipment Utilized During Treatment  Gait belt    Activity Tolerance  Patient tolerated treatment well    Behavior During Therapy  WFL for tasks assessed/performed       Past Medical History:  Diagnosis Date  . Ataxic gait   . Compression fracture of C-spine (HCC) 12/12/2019   C4-C5  . GERD (gastroesophageal reflux disease)   . Herniated disc, cervical   . Hypoglycemia   . Migraines     Past Surgical History:  Procedure Laterality Date  . ANKLE SURGERY    . DILATION AND CURETTAGE OF UTERUS    . WISDOM TOOTH EXTRACTION      There were no vitals filed for this visit.  Subjective Assessment - 03/13/20 1237    Subjective  Pt ambulates into clinic without AD and wearing heels. Notices walking is hardest in the morning. Has only had a couple instances of dizziness when bending over to pick something up - it is relatively infrequent and severe. Sees her PCP later today.    Pertinent History  PMH: hx of herniated disc in cervical spine    Diagnostic tests  MRI 2/13 cervical spine: No marrow edema to suggest acute cervical spine fracture. Cervical spondylosis as outlined. No more than mild spinal canalstenosis at any level. Multilevel neural foraminal narrowinggreatest on the left at C4-C5 and  bilaterally at C5-C6(moderate/advanced at these sites).MRI brain: Normal MRI appearance of the brain for age. No evidence of acuteintracranial abnormality    Patient Stated Goals  be able to walk and drive again to be more independent; to play golf and cornhole again    Currently in Pain?  Yes    Pain Score  5     Pain Location  Shoulder    Pain Orientation  Left;Right    Pain Descriptors / Indicators  Sore    Pain Type  Chronic pain         [facility] PT Assessment - 03/13/20 1243      Functional Gait  Assessment   Gait assessed   Yes    Gait Level Surface  Walks 20 ft, slow speed, abnormal gait pattern, evidence for imbalance or deviates 10-15 in outside of the 12 in walkway width. Requires more than 7 sec to ambulate 20 ft.   8.4 seconds   Change in Gait Speed  Able to change speed, demonstrates mild gait deviations, deviates 6-10 in outside of the 12 in walkway width, or no gait deviations, unable to achieve a major change in velocity, or uses a change in velocity, or uses an assistive device.    Gait with Horizontal Head Turns  Performs head turns smoothly with  slight change in gait velocity (eg, minor disruption to smooth gait path), deviates 6-10 in outside 12 in walkway width, or uses an assistive device.    Gait with Vertical Head Turns  Performs task with moderate change in gait velocity, slows down, deviates 10-15 in outside 12 in walkway width but recovers, can continue to walk.    Gait and Pivot Turn  Pivot turns safely within 3 sec and stops quickly with no loss of balance.    Step Over Obstacle  Is able to step over one shoe box (4.5 in total height) without changing gait speed. No evidence of imbalance.    Gait with Narrow Base of Support  Ambulates 4-7 steps.    Gait with Eyes Closed  Walks 20 ft, slow speed, abnormal gait pattern, evidence for imbalance, deviates 10-15 in outside 12 in walkway width. Requires more than 9 sec to ambulate 20 ft.   21.72 seconds   Ambulating  Backwards  Walks 20 ft, uses assistive device, slower speed, mild gait deviations, deviates 6-10 in outside 12 in walkway width.    Steps  Alternating feet, no rail.    Total Score  18    FGA comment:  18/30                   [facility] Adult PT Treatment/Exercise - 03/13/20 0001      Ambulation/Gait   Ambulation/Gait  Yes    Ambulation/Gait Assistance  5: Supervision    Ambulation/Gait Assistance Details  cues for arm swing as pt guarded with gait at times     Ambulation Distance (Feet)  230 Feet    Assistive device  None    Gait Pattern  Step-through pattern;Narrow base of support    Ambulation Surface  Level;Indoor    Stairs  Yes    Stairs Assistance  5: Supervision;4: Min guard    Stairs Assistance Details (indicate cue type and reason)  no rails alternating, single rail descending with alternating pattern and then no rails descending with min guard and pt taking incr time    Stair Management Technique  Alternating pattern;Forwards;One rail Right    Number of Stairs  8    Height of Stairs  6          Access Code: [email or web address] URL: [email or web address] Date: 03/13/2020 Prepared by: Sherlie Banhloe Dameion Briles  Upgraded/revised HEP - bolded additions below   Exercises Seated Cervical Rotation AROM - 1 x daily - 7 x weekly - 4-5 reps - 4 sets Seated Cervical Sidebending AROM - 1 x daily - 7 x weekly - 4-5 reps - 4 sets Standing Marching - 1 x daily - 5 x weekly - 3 sets - down and back at counter top with cues for slowd and controlled and holding SLS for approx. 3 seconds and cues for heel strike  Standing Gaze Stabilization with Head Rotation - 1 x daily - 5 x weekly - 2 sets - 30-60 second hold Standing Gaze Stabilization with Head Nod - 1 x daily - 5 x weekly - 2 sets - 30-60 hold Tandem Walking - 1-2 x daily - 5 x weekly - 3 sets - down and back at counter with intermittent UE support  Standing Hip Abduction with Counter Support - 1 x daily - 5 x weekly - 2 sets -  10 reps - verbal and demo cues for proper technique, due to pt still demonstrating hip weakness when descending stairs  Romberg Stance Eyes Closed on Foam Pad -  1 x daily - 5 x weekly - 2 sets - 10 reps - 2 x 10 reps head turns, 2 x 10 reps head nods   Balance Exercises - 03/13/20 1603      Balance Exercises: Standing   Standing Eyes Closed  Foam/compliant surface;2 reps;30 secs    Standing Eyes Closed Limitations  on 2 pillows        PT Education - 03/13/20 1559    Education Details  new additions to HEP, results of FGA    Person(s) Educated  Patient    Methods  Explanation;Demonstration;Handout    Comprehension  Verbalized understanding;Returned demonstration       PT Short Term Goals - 03/13/20 1254      PT SHORT TERM GOAL #1   Title  Pt will undergo assessment of FGA with no AD.    Baseline  scored 18/30 on 03/13/20    Time  4    Period  Weeks    Status  Achieved    Target Date  04/09/20      PT SHORT TERM GOAL #2   Title  Pt will demonstrate independence with ongoing HEP    Time  4    Period  Weeks    Status  New    Target Date  04/09/20      PT SHORT TERM GOAL #3   Title  Pt will initiate aquatic therapy    Time  4    Period  Weeks    Status  New    Target Date  04/09/20      PT SHORT TERM GOAL #4   Title  Pt will improve gait speed without AD to at least 3.2 ft/sec in order to improve community mobility.    Baseline  2.9 ft/sec    Time  4    Period  Weeks    Status  Revised    Target Date  04/09/20      PT SHORT TERM GOAL #5   Title  Pt will improve B cervical rotation AROM to at least 50 degrees with mild pain in order to improve functional mobility.    Baseline  40 deg bilaterally    Time  4    Period  Weeks    Status  Revised    Target Date  04/09/20        PT Long Term Goals - 03/13/20 1605      PT LONG TERM GOAL #1   Title  Pt will be independent with final HEP in order to build upon functional gains made in therapy.    Time  8    Period   Weeks    Status  Revised      PT LONG TERM GOAL #2   Title  Pt will ambulate 1000' outside over paved and grassy surfaces without AD, independently    Time  8    Period  Weeks    Status  Revised      PT LONG TERM GOAL #3   Title  Pt will improve FGA score by 4 points to indicate decreased falls risk in the community    Baseline  18/30 on 03/13/20    Time  8    Period  Weeks    Status  New      PT LONG TERM GOAL #4   Title  Pt will improve B cervical rotation AROM to 65 degrees for functional ROM for driving.    Baseline  40  deg    Time  8    Period  Weeks    Status  Revised      PT LONG TERM GOAL #6   Title  Pt will improve gait speed without AD to 3.8 ft/sec in order to decr fall risk and improve community mobility.    Time  8    Period  Weeks    Status  Revised      PT LONG TERM GOAL #7   Title  Pt will demonstrate decreased motion sensitivity as indicated by ability to perform bending down to the floor, performing head turns/nods and body turns without any dizziness; pt will also improve use of vestibular system as indicated by ability to maintain 30 seconds on each condition of the MCTSIB.    Baseline  lightheaded-mild dizziness on MSQ; 4 seconds on conditions 2,3,4; 30 seconds on condition 1 but with increased use of ankle strategy    Time  8    Period  Weeks    Status  On-going      PT LONG TERM GOAL #8   Title  Pt will demonstrate ability to stand without support x 10 minutes to play corn hole and demonstrate ability to hit whiffle golf balls in grass x 10 reps with supervision    Time  8    Period  Weeks    Status  On-going            Plan - 03/13/20 1602    Clinical Impression Statement  Assessed FGA today with no AD with pt scoring an 18/30, indicating pt is at a higher risk of falls. Pt most challenged by gait with eyes closed, tandem stance, and gait with head nods. Revised and upgraded pt's HEP as appropriate for lower extremity strengthening, dynamic  balance, and balance with eyes closed. Will continue to progess towards LTGs.    Personal Factors and Comorbidities  Past/Current Experience;Profession    Examination-Activity Limitations  Bend;Carry;Dressing;Locomotion Level;Squat;Stairs;Lift;Stand;Transfers;Hygiene/Grooming;Bathing    Examination-Participation Restrictions  Cleaning;Community Activity;Driving;Shop;Laundry    Rehab Potential  Good    PT Frequency  2x / week    PT Duration  8 weeks    PT Treatment/Interventions  ADLs/Self Care Home Management;Canalith Repostioning;DME Instruction;Gait training;Stair training;Functional mobility training;Neuromuscular re-education;Balance training;Therapeutic exercise;Therapeutic activities;Patient/family education;Dry needling;Passive range of motion;Energy conservation;Vestibular;Aquatic Therapy;Moist Heat;Cryotherapy;Manual techniques    PT Next Visit Plan  Treadmill training for home.  Any word about aquatic?  One more TDN to R upper trap and I will do another vestibular assessment.  progress x1 viewing in standing by increasing time and decreasing UE support (did not have time to review today).  Gait with no AD over compliant surfaces/obstacles/scanning environment - Wants to be able to play golf and cornhole    Consulted and Agree with Plan of Care  Patient       Patient will benefit from skilled therapeutic intervention in order to improve the following deficits and impairments:  Abnormal gait, Decreased activity tolerance, Decreased balance, Decreased coordination, Difficulty walking, Decreased strength, Dizziness, Pain, Decreased mobility, Impaired sensation, Decreased range of motion  Visit Diagnosis: Other symptoms and signs involving the nervous system  Other abnormalities of gait and mobility  Muscle weakness (generalized)  Ataxic gait     Problem List Patient Active Problem List   Diagnosis Date Noted  . Vitamin D deficiency 01/26/2020  . B12 deficiency 01/25/2020  .  Anxiety and depression 01/19/2020  . Alcohol use 01/19/2020  . Concussion with loss of consciousness 01/03/2020  .  Thyroid nodule 01/03/2020  . Irregular menses 04/28/2019  . Neuropathy 04/28/2019  . Infertility counseling 10/01/2017  . DDD (degenerative disc disease), cervical 11/29/2016  . Hypoglycemia 11/29/2016  . Kidney stone 11/29/2016  . Sinusitis 11/29/2016  . Insomnia 03/08/2016  . Neck pain 03/08/2016  . 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

## 2020-03-13 NOTE — Patient Instructions (Signed)
Access Code: [email or web address] URL: [email or web address] Date: 03/13/2020 Prepared by: Sherlie Ban  Exercises Seated Cervical Rotation AROM - 1 x daily - 7 x weekly - 4-5 reps - 4 sets Seated Cervical Sidebending AROM - 1 x daily - 7 x weekly - 4-5 reps - 4 sets Standing Marching - 1 x daily - 5 x weekly - 3 sets Standing Gaze Stabilization with Head Rotation - 1 x daily - 5 x weekly - 2 sets - 30-60 second hold Standing Gaze Stabilization with Head Nod - 1 x daily - 5 x weekly - 2 sets - 30-60 hold Tandem Walking - 1-2 x daily - 5 x weekly - 3 sets Standing Hip Abduction with Counter Support - 1 x daily - 5 x weekly - 2 sets - 10 reps Romberg Stance Eyes Closed on Foam Pad - 1 x daily - 5 x weekly - 2 sets - 10 reps

## 2020-03-13 NOTE — Patient Instructions (Signed)
It was great to see you!  Decrease Prozac to 40 mg daily. Increase Trazodone to 100 mg daily.  Continue Wellbutrin and Buspar.  Crisis line: [phone]  I will have Colen Darling (our office therapist) give you a call -- please call her back asap to come up with a plan.  Take care,  Jarold Motto PA-C
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
## 2020-03-15 ENCOUNTER — Ambulatory Visit: Payer: 59 | Admitting: Physical Therapy

## 2020-03-15 ENCOUNTER — Other Ambulatory Visit: Payer: Self-pay

## 2020-03-15 ENCOUNTER — Encounter: Payer: Self-pay | Admitting: Physical Therapy

## 2020-03-15 ENCOUNTER — Other Ambulatory Visit: Payer: Self-pay | Admitting: Family Medicine

## 2020-03-15 ENCOUNTER — Ambulatory Visit (HOSPITAL_COMMUNITY): Payer: 59 | Admitting: Licensed Clinical Social Worker

## 2020-03-15 ENCOUNTER — Ambulatory Visit
Admission: RE | Admit: 2020-03-15 | Discharge: 2020-03-15 | Disposition: A | Discharge status: Home / Self Care | Payer: 59 | Source: Ambulatory Visit | Attending: Family Medicine | Admitting: Family Medicine

## 2020-03-15 DIAGNOSIS — E041 Nontoxic single thyroid nodule: Secondary | ICD-10-CM

## 2020-03-15 DIAGNOSIS — R2689 Other abnormalities of gait and mobility: Secondary | ICD-10-CM

## 2020-03-15 DIAGNOSIS — M6281 Muscle weakness (generalized): Secondary | ICD-10-CM

## 2020-03-15 DIAGNOSIS — R29818 Other symptoms and signs involving the nervous system: Secondary | ICD-10-CM | POA: Diagnosis not present

## 2020-03-15 DIAGNOSIS — R260 Ataxic gait: Secondary | ICD-10-CM

## 2020-03-15 DIAGNOSIS — R42 Dizziness and giddiness: Secondary | ICD-10-CM

## 2020-03-15 DIAGNOSIS — R2681 Unsteadiness on feet: Secondary | ICD-10-CM

## 2020-03-15 IMAGING — US US THYROID
1 series · 7 of 7 positions shown · non-contrast
Comparison: Thyroid ultrasound on [DATE]

CLINICAL DATA: Initial incidental detection of potential thyroid
nodule in the right lobe by CT followed by thyroid ultrasound on
[DATE] detecting what appear to be a 3.4 cm right mid thyroid
nodule. The patient presents for fine-needle aspiration of this
nodule.

EXAM:
ULTRASOUND OF HEAD/NECK SOFT TISSUES
TECHNIQUE: Ultrasound examination of the head and neck soft tissues was
performed prior to potential thyroid nodule biopsy.

[Series 1: us thyroid · 0.06mm/px · 7 acquisitions, 7 frames shown]
[im 1/7]
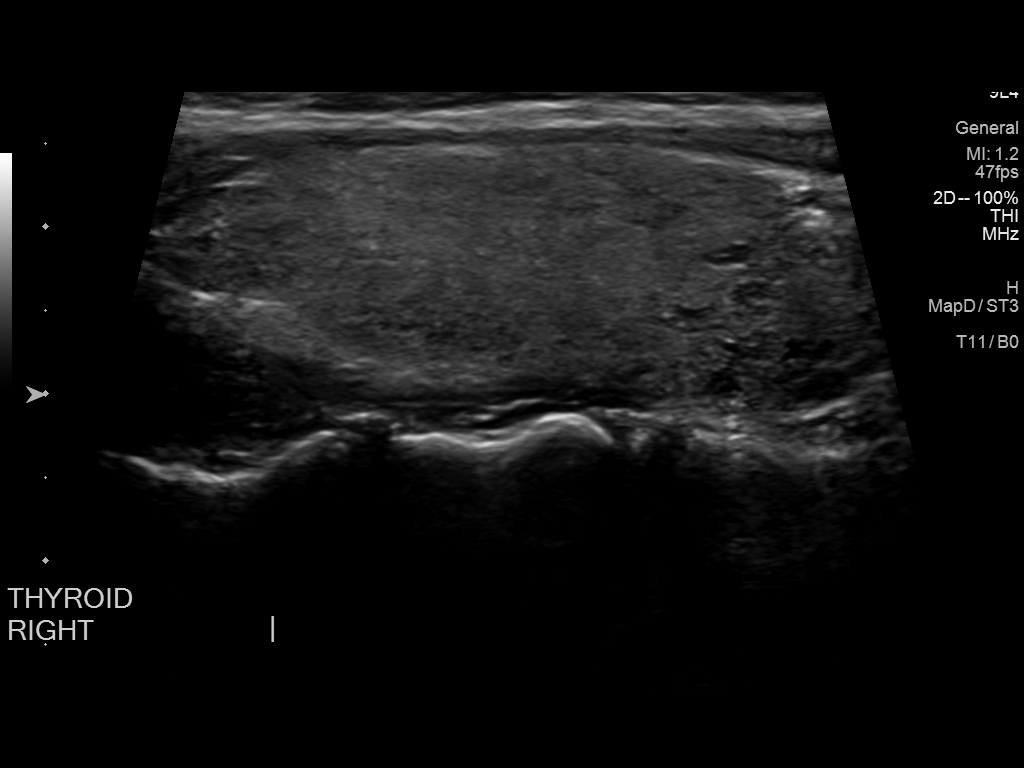
[im 2/7]
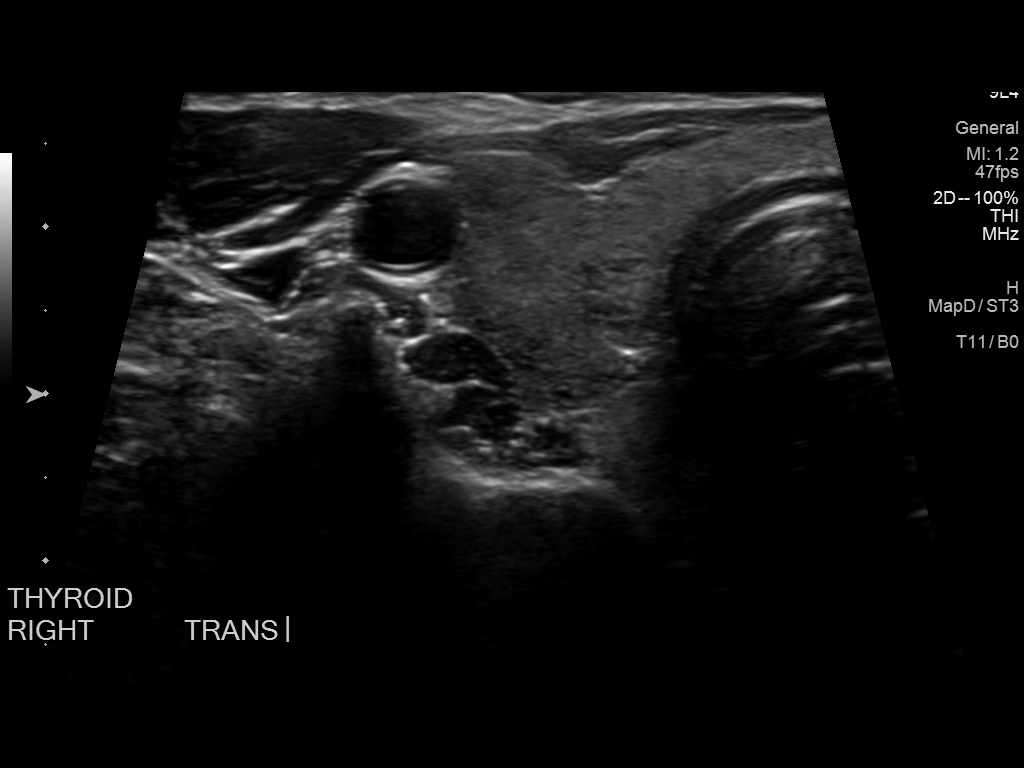
[im 3/7]
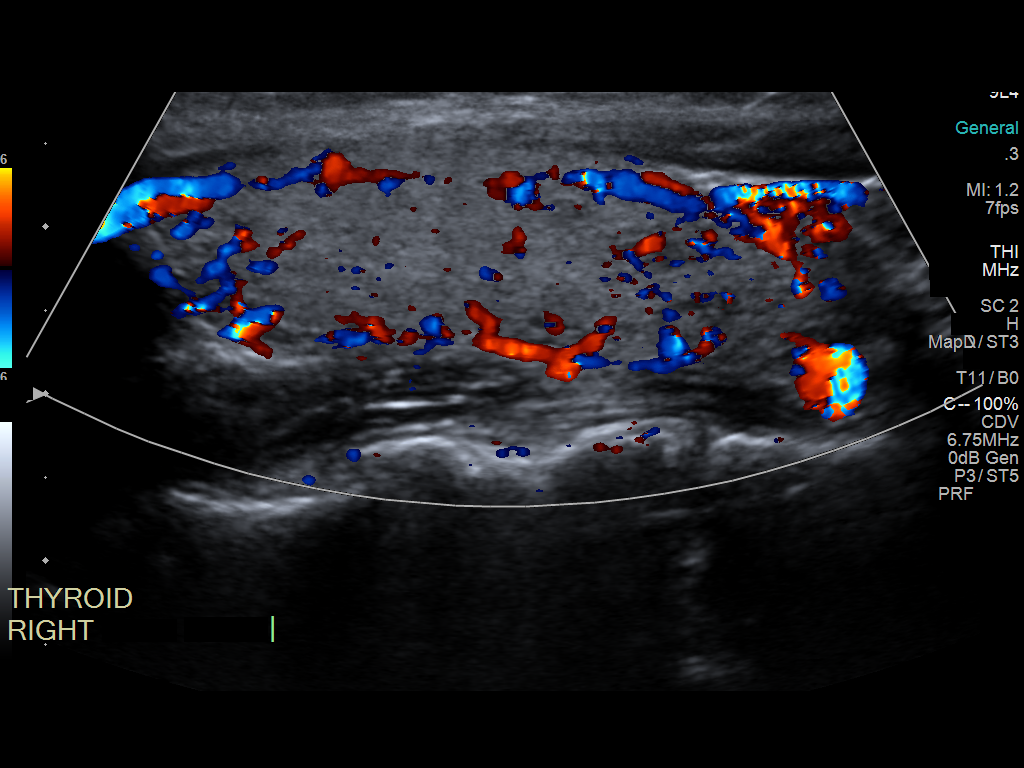
[im 4/7]
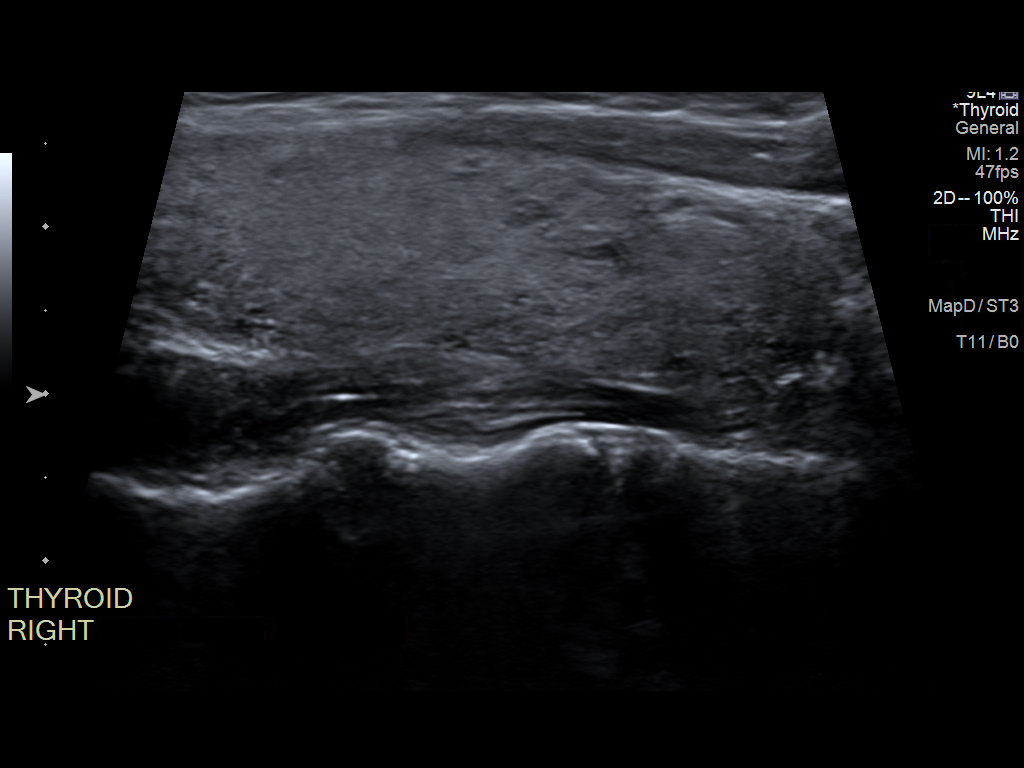
[im 5/7]
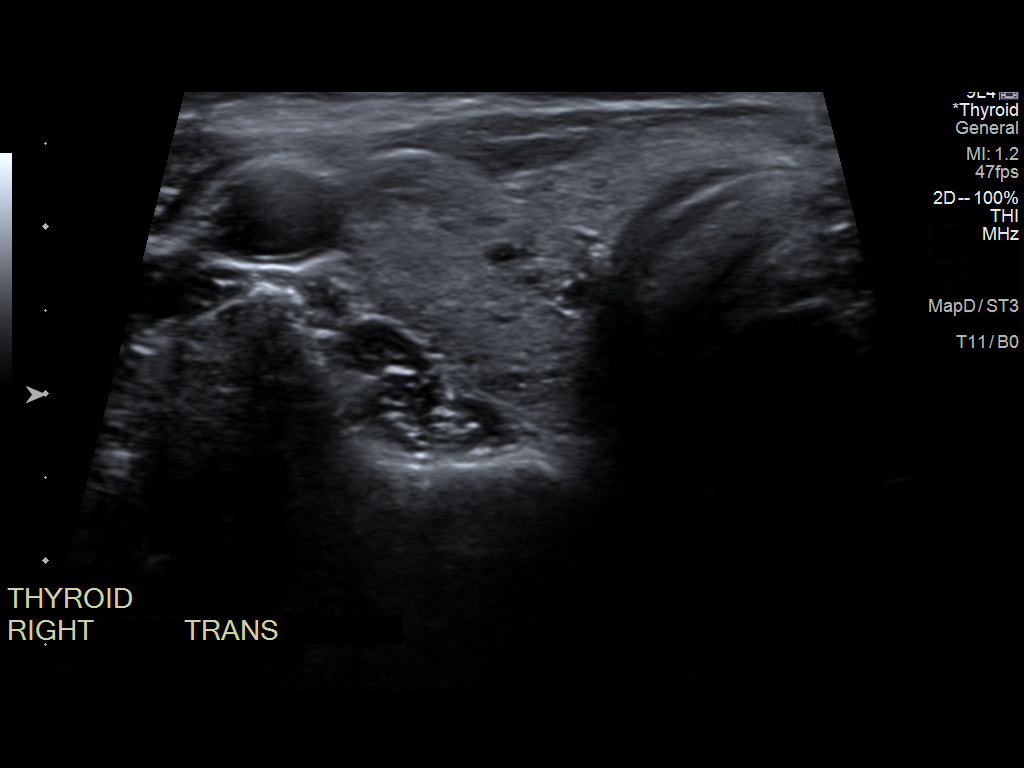
[im 6/7]
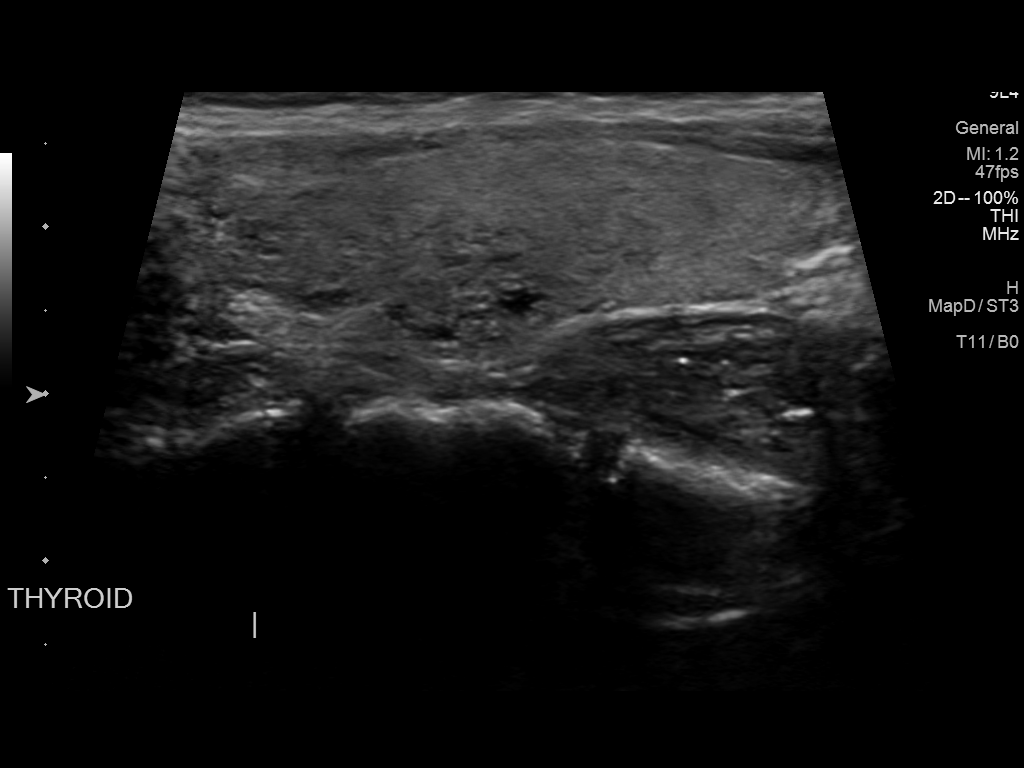
[im 7/7]
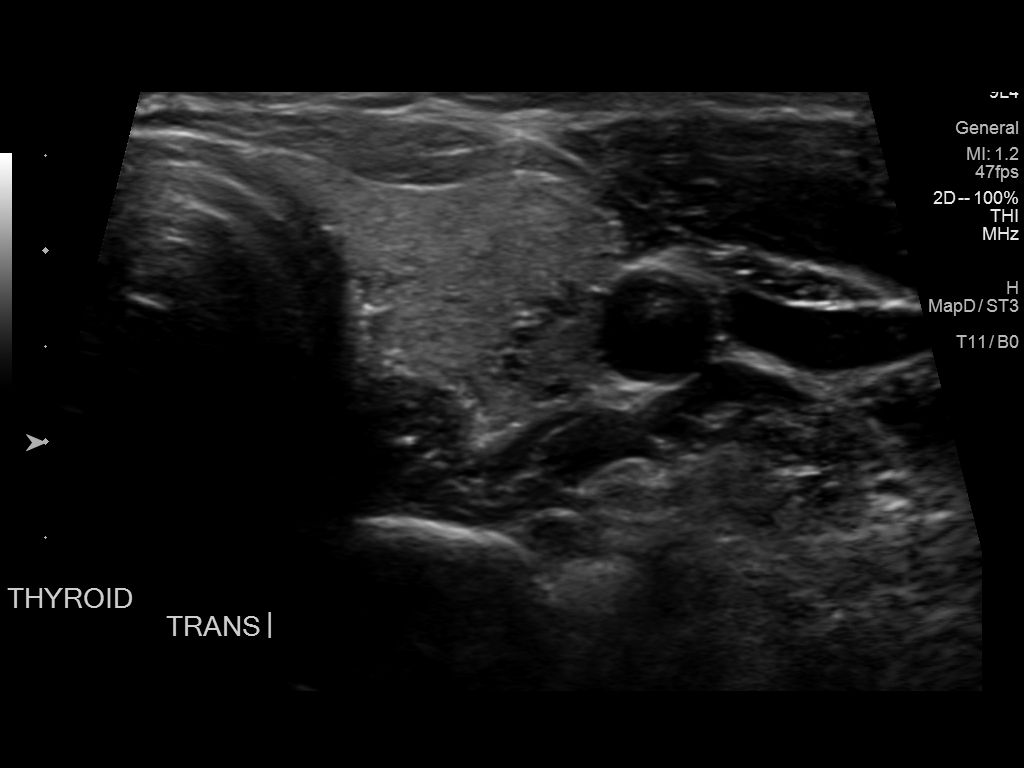

[7 of 7 positions shown; findings below may reference images not displayed]

FINDINGS: By ultrasound today, the previously described nodule by thyroid
ultrasound is clearly a pseudo nodule and there is no focal
marginated nodule present in the right lobe. Right lobe parenchyma
is mildly heterogeneous containing some small cystic areas which is
a similar appearance to the left lobe. There is no need for further
thyroid imaging or follow-up.
IMPRESSION: Pseudo nodule of right lobe of thyroid with no focal marginated
nodule present. Biopsy was therefore canceled. No further thyroid
imaging or follow-up necessary.

## 2020-03-15 NOTE — Therapy (Signed)
[facility] [address] [place], [place], [address] Phone: [phone]   Fax:  [phone]  Physical Therapy Treatment  Patient Details  Name: Jane Hendricks MRN: [id number] Date of Birth: [birth year] Referring Provider (PT): Clementeen Graham, MD   Encounter Date: 03/15/2020  PT End of Session - 03/15/20 1632    Visit Number  22    Number of Visits  36    Date for PT Re-Evaluation  05/09/20    Authorization Type  [employer] - $40 copay - VL: 60 (PT/OT/ST combined)    Authorization - Visit Number  22    Authorization - Number of Visits  60    PT Start Time  1241   pt arrived late   PT Stop Time  1320    PT Time Calculation (min)  39 min    Activity Tolerance  Patient tolerated treatment well    Behavior During Therapy  WFL for tasks assessed/performed       Past Medical History:  Diagnosis Date  . Ataxic gait   . Compression fracture of C-spine (HCC) 12/12/2019   C4-C5  . GERD (gastroesophageal reflux disease)   . Herniated disc, cervical   . Hypoglycemia   . Migraines     Past Surgical History:  Procedure Laterality Date  . ANKLE SURGERY    . DILATION AND CURETTAGE OF UTERUS    . WISDOM TOOTH EXTRACTION      There were no vitals filed for this visit.  Subjective Assessment - 03/15/20 1242    Subjective  Reports feeling a little more off balance today but not enough to use the RW; wearing heels again today.  Feels like the dry needling is helping.    Pertinent History  PMH: hx of herniated disc in cervical spine    Diagnostic tests  MRI 2/13 cervical spine: No marrow edema to suggest acute cervical spine fracture. Cervical spondylosis as outlined. No more than mild spinal canalstenosis at any level. Multilevel neural foraminal narrowinggreatest on the left at C4-C5 and bilaterally at C5-C6(moderate/advanced at these sites).MRI brain: Normal MRI appearance of the brain for age. No evidence of acuteintracranial  abnormality    Patient Stated Goals  be able to walk and drive again to be more independent; to play golf and cornhole again                        [facility] Adult PT Treatment/Exercise - 03/15/20 1630      Manual Therapy   Manual Therapy  Joint mobilization;Myofascial release;Manual Traction    Manual therapy comments  Performed in supine after TDN    Joint Mobilization  Performed grade I/II PA and lateral mobilizations to upper, middle and lower cervical spine; lateral mobilizations to focus on rotation to the L    Myofascial Release  suboccipital release after TDN    Manual Traction  in supine after TDN       Trigger Point Dry Needling - 03/15/20 1629    Consent Given?  Yes    Muscles Treated Head and Neck  Upper trapezius    Upper Trapezius Response  Twitch reponse elicited;Palpable increased muscle length           PT Education - 03/15/20 1633    Education Details  may be adding aquatic therapy visits soon    Person(s) Educated  Patient    Methods  Explanation    Comprehension  Verbalized understanding  PT Short Term Goals - 03/13/20 1254      PT SHORT TERM GOAL #1   Title  Pt will undergo assessment of FGA with no AD.    Baseline  scored 18/30 on 03/13/20    Time  4    Period  Weeks    Status  Achieved    Target Date  04/09/20      PT SHORT TERM GOAL #2   Title  Pt will demonstrate independence with ongoing HEP    Time  4    Period  Weeks    Status  New    Target Date  04/09/20      PT SHORT TERM GOAL #3   Title  Pt will initiate aquatic therapy    Time  4    Period  Weeks    Status  New    Target Date  04/09/20      PT SHORT TERM GOAL #4   Title  Pt will improve gait speed without AD to at least 3.2 ft/sec in order to improve community mobility.    Baseline  2.9 ft/sec    Time  4    Period  Weeks    Status  Revised    Target Date  04/09/20      PT SHORT TERM GOAL #5   Title  Pt will improve B cervical rotation AROM to at least  50 degrees with mild pain in order to improve functional mobility.    Baseline  40 deg bilaterally    Time  4    Period  Weeks    Status  Revised    Target Date  04/09/20        PT Long Term Goals - 03/13/20 1605      PT LONG TERM GOAL #1   Title  Pt will be independent with final HEP in order to build upon functional gains made in therapy.    Time  8    Period  Weeks    Status  Revised      PT LONG TERM GOAL #2   Title  Pt will ambulate 1000' outside over paved and grassy surfaces without AD, independently    Time  8    Period  Weeks    Status  Revised      PT LONG TERM GOAL #3   Title  Pt will improve FGA score by 4 points to indicate decreased falls risk in the community    Baseline  18/30 on 03/13/20    Time  8    Period  Weeks    Status  New      PT LONG TERM GOAL #4   Title  Pt will improve B cervical rotation AROM to 65 degrees for functional ROM for driving.    Baseline  40 deg    Time  8    Period  Weeks    Status  Revised      PT LONG TERM GOAL #6   Title  Pt will improve gait speed without AD to 3.8 ft/sec in order to decr fall risk and improve community mobility.    Time  8    Period  Weeks    Status  Revised      PT LONG TERM GOAL #7   Title  Pt will demonstrate decreased motion sensitivity as indicated by ability to perform bending down to the floor, performing head turns/nods and body turns without any dizziness; pt will also  improve use of vestibular system as indicated by ability to maintain 30 seconds on each condition of the MCTSIB.    Baseline  lightheaded-mild dizziness on MSQ; 4 seconds on conditions 2,3,4; 30 seconds on condition 1 but with increased use of ankle strategy    Time  8    Period  Weeks    Status  On-going      PT LONG TERM GOAL #8   Title  Pt will demonstrate ability to stand without support x 10 minutes to play corn hole and demonstrate ability to hit whiffle golf balls in grass x 10 reps with supervision    Time  8    Period   Weeks    Status  On-going            Plan - 03/15/20 1633    Clinical Impression Statement  Continued to utilize trigger point dry needling and manual therapy to increase functional ROM of cervical spine and decrease myofascial pain; pt tolerated well with no reports of headache or dizziness during treatment.  A little dizzy when returning to standing but symptoms settled quickly and pt able to ambulate with supervision to waiting area.    Personal Factors and Comorbidities  Past/Current Experience;Profession    Examination-Activity Limitations  Bend;Carry;Dressing;Locomotion Level;Squat;Stairs;Lift;Stand;Transfers;Hygiene/Grooming;Bathing    Examination-Participation Restrictions  Cleaning;Community Activity;Driving;Shop;Laundry    Rehab Potential  Good    PT Frequency  2x / week    PT Duration  8 weeks    PT Treatment/Interventions  ADLs/Self Care Home Management;Canalith Repostioning;DME Instruction;Gait training;Stair training;Functional mobility training;Neuromuscular re-education;Balance training;Therapeutic exercise;Therapeutic activities;Patient/family education;Dry needling;Passive range of motion;Energy conservation;Vestibular;Aquatic Therapy;Moist Heat;Cryotherapy;Manual techniques    PT Next Visit Plan  Gait with no AD over compliant surfaces/obstacles/scanning environment - Wants to be able to play golf and cornhole.   I think Langley Gauss may schedule her for aquatic soon.  Treadmill training for home. TDN to cervical and paraspinal mm and perform another vestibular assessment.  progress x1 viewing in standing by increasing time and decreasing UE support (did not have time to review today).    Consulted and Agree with Plan of Care  Patient       Patient will benefit from skilled therapeutic intervention in order to improve the following deficits and impairments:  Abnormal gait, Decreased activity tolerance, Decreased balance, Decreased coordination, Difficulty walking, Decreased  strength, Dizziness, Pain, Decreased mobility, Impaired sensation, Decreased range of motion  Visit Diagnosis: Other symptoms and signs involving the nervous system  Other abnormalities of gait and mobility  Muscle weakness (generalized)  Ataxic gait  Dizziness and giddiness  Unsteadiness on feet     Problem List Patient Active Problem List   Diagnosis Date Noted  . Vitamin D deficiency 01/26/2020  . B12 deficiency 01/25/2020  . Anxiety and depression 01/19/2020  . Alcohol use 01/19/2020  . Concussion with loss of consciousness 01/03/2020  . Thyroid nodule 01/03/2020  . Irregular menses 04/28/2019  . Neuropathy 04/28/2019  . Infertility counseling 10/01/2017  . DDD (degenerative disc disease), cervical 11/29/2016  . Hypoglycemia 11/29/2016  . Kidney stone 11/29/2016  . Sinusitis 11/29/2016  . Insomnia 03/08/2016  . Neck pain 03/08/2016  . 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

## 2020-03-16 NOTE — Progress Notes (Signed)
Repeat ultrasound of the neck in preparation for biopsy look pretty darn normal and biopsy not needed.  This is great news 1 less thing to have to do.

## 2020-03-20 ENCOUNTER — Telehealth (INDEPENDENT_AMBULATORY_CARE_PROVIDER_SITE_OTHER): Payer: 59 | Admitting: Psychiatry

## 2020-03-20 ENCOUNTER — Encounter (HOSPITAL_COMMUNITY): Payer: Self-pay | Admitting: Psychiatry

## 2020-03-20 DIAGNOSIS — F411 Generalized anxiety disorder: Secondary | ICD-10-CM

## 2020-03-20 DIAGNOSIS — F0630 Mood disorder due to known physiological condition, unspecified: Secondary | ICD-10-CM

## 2020-03-20 DIAGNOSIS — Z639 Problem related to primary support group, unspecified: Secondary | ICD-10-CM

## 2020-03-20 DIAGNOSIS — F329 Major depressive disorder, single episode, unspecified: Secondary | ICD-10-CM

## 2020-03-20 DIAGNOSIS — F32A Depression, unspecified: Secondary | ICD-10-CM

## 2020-03-20 DIAGNOSIS — S060X9S Concussion with loss of consciousness of unspecified duration, sequela: Secondary | ICD-10-CM

## 2020-03-20 DIAGNOSIS — F419 Anxiety disorder, unspecified: Secondary | ICD-10-CM | POA: Diagnosis not present

## 2020-03-20 MED ORDER — BUSPIRONE HCL 7.5 MG PO TABS: 7.5000 mg | ORAL_TABLET | Freq: Two times a day (BID) | ORAL | 0 refills | Status: DC

## 2020-03-20 MED ORDER — LAMOTRIGINE 25 MG PO TABS
25.0000 mg | ORAL_TABLET | Freq: Every day | ORAL | 0 refills | Status: DC
Start: 2020-03-20 — End: 2020-04-20
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
## 2020-03-21 ENCOUNTER — Ambulatory Visit: Payer: 59 | Admitting: Psychology

## 2020-03-21 ENCOUNTER — Telehealth: Payer: 59 | Admitting: Emergency Medicine

## 2020-03-21 ENCOUNTER — Encounter: Payer: Self-pay | Admitting: Physician Assistant

## 2020-03-21 DIAGNOSIS — M549 Dorsalgia, unspecified: Secondary | ICD-10-CM

## 2020-03-21 MED ORDER — CYCLOBENZAPRINE HCL 10 MG PO TABS: 5.0000 mg | ORAL_TABLET | Freq: Two times a day (BID) | ORAL | 0 refills | Status: DC | PRN

## 2020-03-21 MED ORDER — CELECOXIB 200 MG PO CAPS
200.0000 mg | ORAL_CAPSULE | Freq: Two times a day (BID) | ORAL | 0 refills | Status: DC
Start: 2020-03-21 — End: 2020-04-07

## 2020-03-21 NOTE — Progress Notes (Addendum)
We are sorry that you are not feeling well.  Here is how we plan to help!  Based on what you have shared with me it looks like you mostly have acute back pain.  Acute back pain is defined as musculoskeletal pain that can resolve in 1-3 weeks with conservative treatment.  I have prescribed Celebrex 200 mg twice daily non-steroid anti-inflammatory (NSAID) as well as Flexeril 5-10 mg 2 times a day for muscle relaxation. DO NOT COMBINE THIS MEDICATION WITH YOUR KLONOPIN! Some patients experience stomach irritation or in increased heartburn with anti-inflammatory drugs.  Please keep in mind that muscle relaxer's can cause fatigue and should not be taken while at work or driving.  Back pain is very common.  The pain often gets better over time.  The cause of back pain is usually not dangerous.  Most people can learn to manage their back pain on their own.  Home Care  Stay active.  Start with short walks on flat ground if you can.  Try to walk farther each day.  Do not sit, drive or stand in one place for more than 30 minutes.  Do not stay in bed.  Do not avoid exercise or work.  Activity can help your back heal faster.  Be careful when you bend or lift an object.  Bend at your knees, keep the object close to you, and do not twist.  Sleep on a firm mattress.  Lie on your side, and bend your knees.  If you lie on your back, put a pillow under your knees.  Only take medicines as told by your doctor.  Put ice on the injured area.  Put ice in a plastic bag  Place a towel between your skin and the bag  Leave the ice on for 15-20 minutes, 3-4 times a day for the first 2-3 days. 210 After that, you can switch between ice and heat packs.  Ask your doctor about back exercises or massage.  Avoid feeling anxious or stressed.  Find good ways to deal with stress, such as exercise.  Get Help Right Way If:  Your pain does not go away with rest or medicine.  Your pain does not go away in 1  week.  You have new problems.  You do not feel well.  The pain spreads into your legs.  You cannot control when you poop (bowel movement) or pee (urinate)  You feel sick to your stomach (nauseous) or throw up (vomit)  You have belly (abdominal) pain.  You feel like you may pass out (faint).  If you develop a fever.  Make Sure you:  Understand these instructions.  Will watch your condition  Will get help right away if you are not doing well or get worse.  Your e-visit answers were reviewed by a board certified advanced clinical practitioner to complete your personal care plan.  Depending on the condition, your plan could have included both over the counter or prescription medications.  If there is a problem please reply  once you have received a response from your provider.  Your safety is important to [place].  If you have drug allergies check your prescription carefully.    You can use MyChart to ask questions about today's visit, request a non-urgent call back, or ask for a work or school excuse for 24 hours related to this e-Visit. If it has been greater than 24 hours you will need to follow up with your provider, or enter a new  e-Visit to address those concerns.  You will get an e-mail in the next two days asking about your experience.  I hope that your e-visit has been valuable and will speed your recovery. Thank you for using e-visits.  Greater than 5 but less than 10 minutes spent researching, coordinating, and implementing care for this patient today

## 2020-03-22 ENCOUNTER — Other Ambulatory Visit: Payer: Self-pay

## 2020-03-22 ENCOUNTER — Ambulatory Visit: Payer: 59 | Admitting: Physical Therapy

## 2020-03-22 ENCOUNTER — Encounter: Payer: Self-pay | Admitting: Physical Therapy

## 2020-03-22 DIAGNOSIS — R29818 Other symptoms and signs involving the nervous system: Secondary | ICD-10-CM | POA: Diagnosis not present

## 2020-03-22 DIAGNOSIS — R2681 Unsteadiness on feet: Secondary | ICD-10-CM

## 2020-03-22 DIAGNOSIS — M6281 Muscle weakness (generalized): Secondary | ICD-10-CM

## 2020-03-22 DIAGNOSIS — R2689 Other abnormalities of gait and mobility: Secondary | ICD-10-CM

## 2020-03-22 NOTE — Therapy (Signed)
[facility] [address] [place], [place], [address] Phone: [phone]   Fax:  [phone]  Physical Therapy Treatment  Patient Details  Name: Jane Hendricks MRN: [id number] Date of Birth: Mar 06, 1981 Referring Provider (PT): Clementeen Graham, MD   Encounter Date: 03/22/2020  PT End of Session - 03/22/20 1334    Visit Number  23    Number of Visits  36    Date for PT Re-Evaluation  05/09/20    Authorization Type  [employer] - $40 copay - VL: 60 (PT/OT/ST combined)    Authorization - Visit Number  23    Authorization - Number of Visits  60    PT Start Time  1243   pt arrived late   PT Stop Time  1322    PT Time Calculation (min)  39 min    Equipment Utilized During Treatment  Gait belt    Activity Tolerance  Patient tolerated treatment well    Behavior During Therapy  WFL for tasks assessed/performed       Past Medical History:  Diagnosis Date  . Ataxic gait   . Compression fracture of C-spine (HCC) 12/12/2019   C4-C5  . GERD (gastroesophageal reflux disease)   . Herniated disc, cervical   . Hypoglycemia   . Migraines     Past Surgical History:  Procedure Laterality Date  . ANKLE SURGERY    . DILATION AND CURETTAGE OF UTERUS    . WISDOM TOOTH EXTRACTION      There were no vitals filed for this visit.  Subjective Assessment - 03/22/20 1244    Subjective  Got lost while driving here. Saw the psychiatrist the other day who changed some meds. No falls. Dry needling after last time was very painful - was sore for the day and the day after. Overall thinks that it is very helpful.    Pertinent History  PMH: hx of herniated disc in cervical spine    Diagnostic tests  MRI 2/13 cervical spine: No marrow edema to suggest acute cervical spine fracture. Cervical spondylosis as outlined. No more than mild spinal canalstenosis at any level. Multilevel neural foraminal narrowinggreatest on the left at C4-C5 and bilaterally at  C5-C6(moderate/advanced at these sites).MRI brain: Normal MRI appearance of the brain for age. No evidence of acuteintracranial abnormality    Patient Stated Goals  be able to walk and drive again to be more independent; to play golf and cornhole again    Currently in Pain?  Yes    Pain Score  5    upper traps and neck                          [facility] Adult PT Treatment/Exercise - 03/22/20 0001      Ambulation/Gait   Ambulation/Gait  Yes    Ambulation/Gait Assistance  5: Supervision;4: Min guard    Ambulation/Gait Assistance Details  Practiced ambulating outdoors over unlevel paved and grass surfaces with pt carrying a purse and a water cup, as pt reports she notices that she feels more off balanced when holding objects. Pt with no LOB, however did use hands on door when ambulating outside for balance. Also therapist discussed with pt trying to use a backpack vs. Carrying 2-3 different purses/bags when going into work in order to minimize the amount pt has to carry and also to assist with balance. Ambulated 500' outdoors with pt carrying conversation with therapist with no LOB.  Ambulation Distance (Feet)  500 Feet    Assistive device  None    Gait Pattern  Step-through pattern;Narrow base of support    Ambulation Surface  Unlevel;Outdoor;Paved;Grass      Therapeutic Activites    Therapeutic Activities  Other Therapeutic Activities    Other Therapeutic Activities  Pt reporting that she has seen the psychiatrist last week. Pt discussing with PT about her homework given from the psychiatrist about doing 3 activities each day that she enjoys for herself. One activity is pt participating in yoga every Saturday in a class and pt recently bought a mat. Therapist discussed with pt wearing comfortable clothes to future sessions in order to practice some poses due to pt not being very familiar with yoga and to also work on balance and any modifications with these poses to assist with  balance, pt stating that this would be a great idea.       Neuro Re-ed    Neuro Re-ed Details   Outdoors on grass: practiced tossing 15 corn hole bags to target (crate) approx. 20 feet away with pt able to squat and pick up each bag and toss. Therapist providing supervision. Performed another 2 x 15 reps of tossing bean bags to moving crate approx. 30 feet away (as pt states it is normally farther away), then throwing from this distance, pt performing L SLS when tossing and needing supervision and a couple episodes of min guard for balance - pt unable to throw into crate from the farther distance.                PT Short Term Goals - 03/13/20 1254      PT SHORT TERM GOAL #1   Title  Pt will undergo assessment of FGA with no AD.    Baseline  scored 18/30 on 03/13/20    Time  4    Period  Weeks    Status  Achieved    Target Date  04/09/20      PT SHORT TERM GOAL #2   Title  Pt will demonstrate independence with ongoing HEP    Time  4    Period  Weeks    Status  New    Target Date  04/09/20      PT SHORT TERM GOAL #3   Title  Pt will initiate aquatic therapy    Time  4    Period  Weeks    Status  New    Target Date  04/09/20      PT SHORT TERM GOAL #4   Title  Pt will improve gait speed without AD to at least 3.2 ft/sec in order to improve community mobility.    Baseline  2.9 ft/sec    Time  4    Period  Weeks    Status  Revised    Target Date  04/09/20      PT SHORT TERM GOAL #5   Title  Pt will improve B cervical rotation AROM to at least 50 degrees with mild pain in order to improve functional mobility.    Baseline  40 deg bilaterally    Time  4    Period  Weeks    Status  Revised    Target Date  04/09/20        PT Long Term Goals - 03/13/20 1605      PT LONG TERM GOAL #1   Title  Pt will be independent with final HEP in order to  build upon functional gains made in therapy.    Time  8    Period  Weeks    Status  Revised      PT LONG TERM GOAL #2    Title  Pt will ambulate 1000' outside over paved and grassy surfaces without AD, independently    Time  8    Period  Weeks    Status  Revised      PT LONG TERM GOAL #3   Title  Pt will improve FGA score by 4 points to indicate decreased falls risk in the community    Baseline  18/30 on 03/13/20    Time  8    Period  Weeks    Status  New      PT LONG TERM GOAL #4   Title  Pt will improve B cervical rotation AROM to 65 degrees for functional ROM for driving.    Baseline  40 deg    Time  8    Period  Weeks    Status  Revised      PT LONG TERM GOAL #6   Title  Pt will improve gait speed without AD to 3.8 ft/sec in order to decr fall risk and improve community mobility.    Time  8    Period  Weeks    Status  Revised      PT LONG TERM GOAL #7   Title  Pt will demonstrate decreased motion sensitivity as indicated by ability to perform bending down to the floor, performing head turns/nods and body turns without any dizziness; pt will also improve use of vestibular system as indicated by ability to maintain 30 seconds on each condition of the MCTSIB.    Baseline  lightheaded-mild dizziness on MSQ; 4 seconds on conditions 2,3,4; 30 seconds on condition 1 but with increased use of ankle strategy    Time  8    Period  Weeks    Status  On-going      PT LONG TERM GOAL #8   Title  Pt will demonstrate ability to stand without support x 10 minutes to play corn hole and demonstrate ability to hit whiffle golf balls in grass x 10 reps with supervision    Time  8    Period  Weeks    Status  On-going            Plan - 03/22/20 1356    Clinical Impression Statement  Pt arrived late to session today. Focus of today's session was gait over compliant surfaces while dual tasking and carrying objects as well as practicing cornhole outside on unlevel surfaces. Pt able to demo squatting down to pick up corn hole bags with no LOB. When target was put away at a farther distance, pt with tendency to  perform toss with little "hop" of RLE and stand with SLS on LLE for brief amounts of time, at times needed min guard for balance. Will continue to progress towards LTGs.    Personal Factors and Comorbidities  Past/Current Experience;Profession    Examination-Activity Limitations  Bend;Carry;Dressing;Locomotion Level;Squat;Stairs;Lift;Stand;Transfers;Hygiene/Grooming;Bathing    Examination-Participation Restrictions  Cleaning;Community Activity;Driving;Shop;Laundry    Rehab Potential  Good    PT Frequency  2x / week    PT Duration  8 weeks    PT Treatment/Interventions  ADLs/Self Care Home Management;Canalith Repostioning;DME Instruction;Gait training;Stair training;Functional mobility training;Neuromuscular re-education;Balance training;Therapeutic exercise;Therapeutic activities;Patient/family education;Dry needling;Passive range of motion;Energy conservation;Vestibular;Aquatic Therapy;Moist Heat;Cryotherapy;Manual techniques    PT Next Visit Plan  discussed practicing yoga poses with pt due to pt wanting to attend a weekly yoga class. Gait with no AD over compliant surfaces/obstacles/scanning environment - Wants to be able to play golf and cornhole.  Treadmill training for home. TDN to cervical and paraspinal mm and perform another vestibular assessment.  progress x1 viewing in standing by increasing time and decreasing UE support (did not have time to review today).    Recommended Other Services  waiting to hear back from [place] for [employer] and Agree with Plan of Care  Patient       Patient will benefit from skilled therapeutic intervention in order to improve the following deficits and impairments:  Abnormal gait, Decreased activity tolerance, Decreased balance, Decreased coordination, Difficulty walking, Decreased strength, Dizziness, Pain, Decreased mobility, Impaired sensation, Decreased range of motion  Visit Diagnosis: Other symptoms and signs involving the nervous  system  Other abnormalities of gait and mobility  Muscle weakness (generalized)  Unsteadiness on feet     Problem List Patient Active Problem List   Diagnosis Date Noted  . Vitamin D deficiency 01/26/2020  . B12 deficiency 01/25/2020  . Anxiety and depression 01/19/2020  . Alcohol use 01/19/2020  . Concussion with loss of consciousness 01/03/2020  . Thyroid nodule 01/03/2020  . Irregular menses 04/28/2019  . Neuropathy 04/28/2019  . Infertility counseling 10/01/2017  . DDD (degenerative disc disease), cervical 11/29/2016  . Hypoglycemia 11/29/2016  . Kidney stone 11/29/2016  . Sinusitis 11/29/2016  . Insomnia 03/08/2016  . Neck pain 03/08/2016  . 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

## 2020-03-24 ENCOUNTER — Ambulatory Visit: Payer: 59 | Admitting: Physical Therapy

## 2020-03-24 ENCOUNTER — Other Ambulatory Visit: Payer: Self-pay

## 2020-03-24 DIAGNOSIS — R29818 Other symptoms and signs involving the nervous system: Secondary | ICD-10-CM | POA: Diagnosis not present

## 2020-03-24 DIAGNOSIS — M6281 Muscle weakness (generalized): Secondary | ICD-10-CM

## 2020-03-24 DIAGNOSIS — R2689 Other abnormalities of gait and mobility: Secondary | ICD-10-CM

## 2020-03-24 DIAGNOSIS — R2681 Unsteadiness on feet: Secondary | ICD-10-CM

## 2020-03-24 NOTE — Therapy (Signed)
[facility] [address] [place], [place], [address] Phone: [phone]   Fax:  [phone]  Physical Therapy Treatment  Patient Details  Name: Jane Hendricks MRN: [id number] Date of Birth: [birth year] Referring Provider (PT): Clementeen Graham, MD   Encounter Date: 03/24/2020  PT End of Session - 03/24/20 1513    Visit Number  24    Number of Visits  36    Date for PT Re-Evaluation  05/09/20    Authorization Type  [employer] - $40 copay - VL: 60 (PT/OT/ST combined)    Authorization - Visit Number  24    Authorization - Number of Visits  60    PT Start Time  1235    PT Stop Time  1324    PT Time Calculation (min)  49 min    Activity Tolerance  Patient tolerated treatment well    Behavior During Therapy  WFL for tasks assessed/performed       Past Medical History:  Diagnosis Date  . Ataxic gait   . Compression fracture of C-spine (HCC) 12/12/2019   C4-C5  . GERD (gastroesophageal reflux disease)   . Herniated disc, cervical   . Hypoglycemia   . Migraines     Past Surgical History:  Procedure Laterality Date  . ANKLE SURGERY    . DILATION AND CURETTAGE OF UTERUS    . WISDOM TOOTH EXTRACTION      There were no vitals filed for this visit.  Subjective Assessment - 03/24/20 1244    Subjective  Wrote out list of three things a day to do for herself including yoga, swimming, [employer], etc.  Pt very hopeful about these activities.    Pertinent History  PMH: hx of herniated disc in cervical spine    Diagnostic tests  MRI 2/13 cervical spine: No marrow edema to suggest acute cervical spine fracture. Cervical spondylosis as outlined. No more than mild spinal canalstenosis at any level. Multilevel neural foraminal narrowinggreatest on the left at C4-C5 and bilaterally at C5-C6(moderate/advanced at these sites).MRI brain: Normal MRI appearance of the brain for age. No evidence of acuteintracranial abnormality    Patient  Stated Goals  be able to walk and drive again to be more independent; to play golf and cornhole again    Currently in Pain?  Yes                        [facility] Adult PT Treatment/Exercise - 03/24/20 1508      Therapeutic Activites    Therapeutic Activities  Other Therapeutic Activities    Other Therapeutic Activities  Reviewed pt's list of things she will do for herself during the week; included yoga.  Pt reports she has never done yoga before.  Pt has not been experiencing dizziness recently but discussed how certain yoga positions may trigger dizziness due to head position.  Demonstrated sun salutation to pt and how to use yoga blocks to bring floor up to her so she doesn't fully invert head and to slowly progress to bringing hands to floor as able to tolerate.  Pt verbalized understanding.  Pt verbalized desire for ongoing TDN through June; discussed with pt the use of other activities (aquatic, yoga, exercise, etc) to assist with relieving tension and maintaining benefit gained from dry needling.  Discussed how goal is self management of tension.       Trigger Point Dry Needling - 03/24/20 1246    Consent Given?  Yes  Education Handout Provided  No    Muscles Treated Head and Neck  Upper trapezius;Splenius capitus;Semispinalis capitus;Cervical multifidi    Dry Needling Comments  Performed in prone; performed upper trap on L side but cervical paraspinals bilaterally    Upper Trapezius Response  Twitch reponse elicited    Splenius capitus Response  Twitch reponse elicited    Semispinalis capitus Response  Twitch reponse elicited    Cervical multifidi Response  Twitch reponse elicited           PT Education - 03/24/20 1507    Education Details  See TA       PT Short Term Goals - 03/13/20 1254      PT SHORT TERM GOAL #1   Title  Pt will undergo assessment of FGA with no AD.    Baseline  scored 18/30 on 03/13/20    Time  4    Period  Weeks    Status  Achieved     Target Date  04/09/20      PT SHORT TERM GOAL #2   Title  Pt will demonstrate independence with ongoing HEP    Time  4    Period  Weeks    Status  New    Target Date  04/09/20      PT SHORT TERM GOAL #3   Title  Pt will initiate aquatic therapy    Time  4    Period  Weeks    Status  New    Target Date  04/09/20      PT SHORT TERM GOAL #4   Title  Pt will improve gait speed without AD to at least 3.2 ft/sec in order to improve community mobility.    Baseline  2.9 ft/sec    Time  4    Period  Weeks    Status  Revised    Target Date  04/09/20      PT SHORT TERM GOAL #5   Title  Pt will improve B cervical rotation AROM to at least 50 degrees with mild pain in order to improve functional mobility.    Baseline  40 deg bilaterally    Time  4    Period  Weeks    Status  Revised    Target Date  04/09/20        PT Long Term Goals - 03/13/20 1605      PT LONG TERM GOAL #1   Title  Pt will be independent with final HEP in order to build upon functional gains made in therapy.    Time  8    Period  Weeks    Status  Revised      PT LONG TERM GOAL #2   Title  Pt will ambulate 1000' outside over paved and grassy surfaces without AD, independently    Time  8    Period  Weeks    Status  Revised      PT LONG TERM GOAL #3   Title  Pt will improve FGA score by 4 points to indicate decreased falls risk in the community    Baseline  18/30 on 03/13/20    Time  8    Period  Weeks    Status  New      PT LONG TERM GOAL #4   Title  Pt will improve B cervical rotation AROM to 65 degrees for functional ROM for driving.    Baseline  40 deg    Time  8    Period  Weeks    Status  Revised      PT LONG TERM GOAL #6   Title  Pt will improve gait speed without AD to 3.8 ft/sec in order to decr fall risk and improve community mobility.    Time  8    Period  Weeks    Status  Revised      PT LONG TERM GOAL #7   Title  Pt will demonstrate decreased motion sensitivity as indicated by  ability to perform bending down to the floor, performing head turns/nods and body turns without any dizziness; pt will also improve use of vestibular system as indicated by ability to maintain 30 seconds on each condition of the MCTSIB.    Baseline  lightheaded-mild dizziness on MSQ; 4 seconds on conditions 2,3,4; 30 seconds on condition 1 but with increased use of ankle strategy    Time  8    Period  Weeks    Status  On-going      PT LONG TERM GOAL #8   Title  Pt will demonstrate ability to stand without support x 10 minutes to play corn hole and demonstrate ability to hit whiffle golf balls in grass x 10 reps with supervision    Time  8    Period  Weeks    Status  On-going            Plan - 03/24/20 1513    Clinical Impression Statement  Utilized trigger point dry needling to continue to address cervical spine ROM and to decrease muscle tension and myofascial pain.  Decreased tension noted in upper trap today but continued to present with increased tension in paraspinals.  Continued to provide education regarding plan and goal of therapy interventions and how to modify first yoga class to minimize triggering dizziness if it occurs.  Will continue to progress higher level balance challenges and progress towards LTG.    Personal Factors and Comorbidities  Past/Current Experience;Profession    Examination-Activity Limitations  Bend;Carry;Dressing;Locomotion Level;Squat;Stairs;Lift;Stand;Transfers;Hygiene/Grooming;Bathing    Examination-Participation Restrictions  Cleaning;Community Activity;Driving;Shop;Laundry    Rehab Potential  Good    PT Frequency  2x / week    PT Duration  8 weeks    PT Treatment/Interventions  ADLs/Self Care Home Management;Canalith Repostioning;DME Instruction;Gait training;Stair training;Functional mobility training;Neuromuscular re-education;Balance training;Therapeutic exercise;Therapeutic activities;Patient/family education;Dry needling;Passive range of  motion;Energy conservation;Vestibular;Aquatic Therapy;Moist Heat;Cryotherapy;Manual techniques    PT Next Visit Plan  How was the yoga class? Gait with no AD over compliant surfaces/obstacles/scanning environment - Wants to be able to play golf and cornhole.  Treadmill training for home. TDN to cervical and paraspinal mm and perform another vestibular assessment.  progress x1 viewing in standing by increasing time and decreasing UE support (did not have time to review today).    Consulted and Agree with Plan of Care  Patient       Patient will benefit from skilled therapeutic intervention in order to improve the following deficits and impairments:  Abnormal gait, Decreased activity tolerance, Decreased balance, Decreased coordination, Difficulty walking, Decreased strength, Dizziness, Pain, Decreased mobility, Impaired sensation, Decreased range of motion  Visit Diagnosis: Other symptoms and signs involving the nervous system  Other abnormalities of gait and mobility  Muscle weakness (generalized)  Unsteadiness on feet     Problem List Patient Active Problem List   Diagnosis Date Noted  . Vitamin D deficiency 01/26/2020  . B12 deficiency 01/25/2020  . Anxiety and depression 01/19/2020  . Alcohol use 01/19/2020  .  Concussion with loss of consciousness 01/03/2020  . Thyroid nodule 01/03/2020  . Irregular menses 04/28/2019  . Neuropathy 04/28/2019  . Infertility counseling 10/01/2017  . DDD (degenerative disc disease), cervical 11/29/2016  . Hypoglycemia 11/29/2016  . Kidney stone 11/29/2016  . Sinusitis 11/29/2016  . Insomnia 03/08/2016  . Neck pain 03/08/2016  . 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 [place], [place], [address] Phone: [phone]   Fax:  [phone]  Name: Jane Hendricks MRN: [id number] Date of Birth: July 08, 1981

## 2020-03-28 ENCOUNTER — Ambulatory Visit: Payer: 59 | Admitting: Physical Therapy

## 2020-03-29 ENCOUNTER — Other Ambulatory Visit: Payer: Self-pay

## 2020-03-29 ENCOUNTER — Ambulatory Visit: Payer: 59 | Admitting: Physical Therapy

## 2020-03-29 DIAGNOSIS — M6281 Muscle weakness (generalized): Secondary | ICD-10-CM

## 2020-03-29 DIAGNOSIS — R29818 Other symptoms and signs involving the nervous system: Secondary | ICD-10-CM | POA: Diagnosis not present

## 2020-03-29 DIAGNOSIS — R2689 Other abnormalities of gait and mobility: Secondary | ICD-10-CM

## 2020-03-29 DIAGNOSIS — R2681 Unsteadiness on feet: Secondary | ICD-10-CM

## 2020-03-30 ENCOUNTER — Encounter: Payer: Self-pay | Admitting: Physical Therapy

## 2020-03-30 NOTE — Therapy (Signed)
[facility] [address] [place], [place], [address] Phone: [phone]   Fax:  [phone]  Physical Therapy Treatment  Patient Details  Name: Jane Hendricks MRN: [id number] Date of Birth: Dec 11, 1980 Referring Provider (PT): Clementeen Graham, MD   Encounter Date: 03/29/2020  PT End of Session - 03/30/20 1654    Visit Number  25    Number of Visits  36    Date for PT Re-Evaluation  05/09/20    Authorization Type  [employer] - $40 copay - VL: 60 (PT/OT/ST combined)    Authorization - Visit Number  24    Authorization - Number of Visits  60    PT Start Time  1425   Pt arrived late for appointment-got lost   PT Stop Time  1500    PT Time Calculation (min)  35 min    Equipment Utilized During Treatment  Other (comment)   ankle buoyancy cuffs, arm buoyancy bar bells   Activity Tolerance  Patient tolerated treatment well    Behavior During Therapy  [facility] for tasks assessed/performed       Past Medical History:  Diagnosis Date  . Ataxic gait   . Compression fracture of C-spine (HCC) 12/12/2019   C4-C5  . GERD (gastroesophageal reflux disease)   . Herniated disc, cervical   . Hypoglycemia   . Migraines     Past Surgical History:  Procedure Laterality Date  . ANKLE SURGERY    . DILATION AND CURETTAGE OF UTERUS    . WISDOM TOOTH EXTRACTION      There were no vitals filed for this visit.  Subjective Assessment - 03/30/20 1653    Subjective  Pt is excited about her first aquatic session.  Has a pool at her house but it is not open yet.    Pertinent History  PMH: hx of herniated disc in cervical spine    Diagnostic tests  MRI 2/13 cervical spine: No marrow edema to suggest acute cervical spine fracture. Cervical spondylosis as outlined. No more than mild spinal canalstenosis at any level. Multilevel neural foraminal narrowinggreatest on the left at C4-C5 and bilaterally at C5-C6(moderate/advanced at these sites).MRI brain:  Normal MRI appearance of the brain for age. No evidence of acuteintracranial abnormality    Patient Stated Goals  be able to walk and drive again to be more independent; to play golf and cornhole again    Currently in Pain?  No/denies       Aquatic therapy at [facility] - pool temp 86.7degrees  Patient seen for aquatic therapy today. Treatment took place in water 3.5-4 feet deep depending upon activity. Pt entered and exited the pool viaramp and supervision.  Runners stretch bil LE's and toes/feet up edge of pool x 30 seconds each bil LE'sboth at beginning and end of session.  Pt performed gait training in pool forwards 32m x 2 repswithout UE support,78m x 2 working on increased armswing,25m x 2 marchingthen 60m x 2 repsbackwards. Performed side stepping x 30m x 2 then side stepping squatsx 31m x 2with UE shoulder abd/add and hand bar bells for resistance.Cues for technique/sequence.   Tandemgait31m x 2.   Standing with bil UE support (except for marching and hamstring curl) forbil LE marching, hip extension, hip abd, hamstring curl, heel raises. Pt able to perform 15 repsalternating LE'seach sidewith ankle buoyancy cuffs.  Ai Chi postures ofenclosing, soothing,freeingand balancingx 15 reps each.   Pt requires buoyancy for support with balance and viscosity for resistance for  strengthening exercises; buoyancy is also needed for off loading body to assist with exercises.Pt using bil UE's significantly to assist with maintaining balance with activities.     PT Short Term Goals - 03/13/20 1254      PT SHORT TERM GOAL #1   Title  Pt will undergo assessment of FGA with no AD.    Baseline  scored 18/30 on 03/13/20    Time  4    Period  Weeks    Status  Achieved    Target Date  04/09/20      PT SHORT TERM GOAL #2   Title  Pt will demonstrate independence with ongoing HEP    Time  4    Period  Weeks    Status  New    Target Date  04/09/20      PT  SHORT TERM GOAL #3   Title  Pt will initiate aquatic therapy    Time  4    Period  Weeks    Status  New    Target Date  04/09/20      PT SHORT TERM GOAL #4   Title  Pt will improve gait speed without AD to at least 3.2 ft/sec in order to improve community mobility.    Baseline  2.9 ft/sec    Time  4    Period  Weeks    Status  Revised    Target Date  04/09/20      PT SHORT TERM GOAL #5   Title  Pt will improve B cervical rotation AROM to at least 50 degrees with mild pain in order to improve functional mobility.    Baseline  40 deg bilaterally    Time  4    Period  Weeks    Status  Revised    Target Date  04/09/20        PT Long Term Goals - 03/13/20 1605      PT LONG TERM GOAL #1   Title  Pt will be independent with final HEP in order to build upon functional gains made in therapy.    Time  8    Period  Weeks    Status  Revised      PT LONG TERM GOAL #2   Title  Pt will ambulate 1000' outside over paved and grassy surfaces without AD, independently    Time  8    Period  Weeks    Status  Revised      PT LONG TERM GOAL #3   Title  Pt will improve FGA score by 4 points to indicate decreased falls risk in the community    Baseline  18/30 on 03/13/20    Time  8    Period  Weeks    Status  New      PT LONG TERM GOAL #4   Title  Pt will improve B cervical rotation AROM to 65 degrees for functional ROM for driving.    Baseline  40 deg    Time  8    Period  Weeks    Status  Revised      PT LONG TERM GOAL #6   Title  Pt will improve gait speed without AD to 3.8 ft/sec in order to decr fall risk and improve community mobility.    Time  8    Period  Weeks    Status  Revised      PT LONG TERM GOAL #7   Title  Pt will demonstrate decreased motion sensitivity as indicated by ability to perform bending down to the floor, performing head turns/nods and body turns without any dizziness; pt will also improve use of vestibular system as indicated by ability to maintain 30  seconds on each condition of the MCTSIB.    Baseline  lightheaded-mild dizziness on MSQ; 4 seconds on conditions 2,3,4; 30 seconds on condition 1 but with increased use of ankle strategy    Time  8    Period  Weeks    Status  On-going      PT LONG TERM GOAL #8   Title  Pt will demonstrate ability to stand without support x 10 minutes to play corn hole and demonstrate ability to hit whiffle golf balls in grass x 10 reps with supervision    Time  8    Period  Weeks    Status  On-going            Plan - 03/30/20 1655    Clinical Impression Statement  Pt reports enjoying her first aquatic session and felt challenged in pool.  Pt using UE's significantly to help her balance in the pool.  Cont per POC.    Personal Factors and Comorbidities  Past/Current Experience;Profession    Examination-Activity Limitations  Bend;Carry;Dressing;Locomotion Level;Squat;Stairs;Lift;Stand;Transfers;Hygiene/Grooming;Bathing    Examination-Participation Restrictions  Cleaning;Community Activity;Driving;Shop;Laundry    Rehab Potential  Good    PT Frequency  2x / week    PT Duration  8 weeks    PT Treatment/Interventions  ADLs/Self Care Home Management;Canalith Repostioning;DME Instruction;Gait training;Stair training;Functional mobility training;Neuromuscular re-education;Balance training;Therapeutic exercise;Therapeutic activities;Patient/family education;Dry needling;Passive range of motion;Energy conservation;Vestibular;Aquatic Therapy;Moist Heat;Cryotherapy;Manual techniques    PT Next Visit Plan  How was the yoga class? Gait with no AD over compliant surfaces/obstacles/scanning environment - Wants to be able to play golf and cornhole.  Treadmill training for home. TDN to cervical and paraspinal mm and perform another vestibular assessment.  progress x1 viewing in standing by increasing time and decreasing UE support (did not have time to review today).    Consulted and Agree with Plan of Care  Patient        Patient will benefit from skilled therapeutic intervention in order to improve the following deficits and impairments:  Abnormal gait, Decreased activity tolerance, Decreased balance, Decreased coordination, Difficulty walking, Decreased strength, Dizziness, Pain, Decreased mobility, Impaired sensation, Decreased range of motion  Visit Diagnosis: Other symptoms and signs involving the nervous system  Other abnormalities of gait and mobility  Muscle weakness (generalized)  Unsteadiness on feet     Problem List Patient Active Problem List   Diagnosis Date Noted  . Vitamin D deficiency 01/26/2020  . B12 deficiency 01/25/2020  . Anxiety and depression 01/19/2020  . Alcohol use 01/19/2020  . Concussion with loss of consciousness 01/03/2020  . Thyroid nodule 01/03/2020  . Irregular menses 04/28/2019  . Neuropathy 04/28/2019  . Infertility counseling 10/01/2017  . DDD (degenerative disc disease), cervical 11/29/2016  . Hypoglycemia 11/29/2016  . Kidney stone 11/29/2016  . Sinusitis 11/29/2016  . Insomnia 03/08/2016  . Neck pain 03/08/2016  . 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 [place], [place], [address] Phone: [phone]   Fax:  [phone]  Name: Ritu Gagliardo MRN: [id number] Date of Birth: [birth year]

## 2020-03-31 ENCOUNTER — Other Ambulatory Visit: Payer: Self-pay

## 2020-03-31 ENCOUNTER — Ambulatory Visit: Payer: 59 | Admitting: Physical Therapy

## 2020-03-31 DIAGNOSIS — R29818 Other symptoms and signs involving the nervous system: Secondary | ICD-10-CM | POA: Diagnosis not present

## 2020-03-31 DIAGNOSIS — M6281 Muscle weakness (generalized): Secondary | ICD-10-CM

## 2020-03-31 DIAGNOSIS — R2681 Unsteadiness on feet: Secondary | ICD-10-CM

## 2020-03-31 DIAGNOSIS — R2689 Other abnormalities of gait and mobility: Secondary | ICD-10-CM

## 2020-03-31 NOTE — Therapy (Signed)
[facility] [address] [place], [place], [address] Phone: [phone]   Fax:  [phone]  Physical Therapy Treatment  Patient Details  Name: Jane Hendricks MRN: [id number] Date of Birth: [birth year] Referring Provider (PT): Clementeen Graham, MD   Encounter Date: 03/31/2020  PT End of Session - 03/31/20 1316    Visit Number  26    Number of Visits  36    Date for PT Re-Evaluation  05/09/20    Authorization Type  [employer] - $40 copay - VL: 60 (PT/OT/ST combined)    Authorization - Visit Number  26    Authorization - Number of Visits  60    PT Start Time  1236   pt arrived late   PT Stop Time  1315    PT Time Calculation (min)  39 min    Equipment Utilized During Treatment  Gait belt    Activity Tolerance  Patient tolerated treatment well    Behavior During Therapy  WFL for tasks assessed/performed       Past Medical History:  Diagnosis Date  . Ataxic gait   . Compression fracture of C-spine (HCC) 12/12/2019   C4-C5  . GERD (gastroesophageal reflux disease)   . Herniated disc, cervical   . Hypoglycemia   . Migraines     Past Surgical History:  Procedure Laterality Date  . ANKLE SURGERY    . DILATION AND CURETTAGE OF UTERUS    . WISDOM TOOTH EXTRACTION      There were no vitals filed for this visit.  Subjective Assessment - 03/31/20 1238    Subjective  Aquatic therapy was awesome. Loved it. Went to yoga last weekend - it was great! Bought the yoga blocks.    Pertinent History  PMH: hx of herniated disc in cervical spine    Diagnostic tests  MRI 2/13 cervical spine: No marrow edema to suggest acute cervical spine fracture. Cervical spondylosis as outlined. No more than mild spinal canalstenosis at any level. Multilevel neural foraminal narrowinggreatest on the left at C4-C5 and bilaterally at C5-C6(moderate/advanced at these sites).MRI brain: Normal MRI appearance of the brain for age. No evidence of  acuteintracranial abnormality    Patient Stated Goals  be able to walk and drive again to be more independent; to play golf and cornhole again    Currently in Pain?  --   "normal neck pain"               03/31/20 0001  Neuro Re-ed   Neuro Re-ed Details  In // bars on BOSU on black side: M/L weight shifting x12 reps initially with BUE support progressing to none, A/P weight shifting x12 reps with no UE support, x10 reps mini squats with BUE support - pt with incr muscle trembling when performing, needing B UE/ fingertip support on bars.  On blue side of BOSU ball SLS with contralateral leg performing a march x10 reps B with fingertip support. In warrior one position: putting forward foot on smooth side of balance disc 3 x 10 seconds B of holding static position for core stability, strengthening, and balance, initially with floating hands and then performing with BUE in full shoulder flexion. Standing on smooth side of blue balance disc: cone toe taps with opposite foot for SLS fading to no UE support x10 reps B. Standing up incline on compliant blue mat with feet together eyes closed 3 x 30 seconds, standing down incline feet apart eyes closed 1 x 30 seconds  then 2 x 30 seconds with feet together eyes closed. Lateral stepping strategy down mat x10 reps B for additional hip ABD strengthening going down incline            PT Education - 03/31/20 1316    Education Details  scheduling for aquatic therapy    Person(s) Educated  Patient    Methods  Explanation    Comprehension  Verbalized understanding       PT Short Term Goals - 03/13/20 1254      PT SHORT TERM GOAL #1   Title  Pt will undergo assessment of FGA with no AD.    Baseline  scored 18/30 on 03/13/20    Time  4    Period  Weeks    Status  Achieved    Target Date  04/09/20      PT SHORT TERM GOAL #2   Title  Pt will demonstrate independence with ongoing HEP    Time  4    Period  Weeks    Status  New    Target Date   04/09/20      PT SHORT TERM GOAL #3   Title  Pt will initiate aquatic therapy    Time  4    Period  Weeks    Status  New    Target Date  04/09/20      PT SHORT TERM GOAL #4   Title  Pt will improve gait speed without AD to at least 3.2 ft/sec in order to improve community mobility.    Baseline  2.9 ft/sec    Time  4    Period  Weeks    Status  Revised    Target Date  04/09/20      PT SHORT TERM GOAL #5   Title  Pt will improve B cervical rotation AROM to at least 50 degrees with mild pain in order to improve functional mobility.    Baseline  40 deg bilaterally    Time  4    Period  Weeks    Status  Revised    Target Date  04/09/20        PT Long Term Goals - 03/13/20 1605      PT LONG TERM GOAL #1   Title  Pt will be independent with final HEP in order to build upon functional gains made in therapy.    Time  8    Period  Weeks    Status  Revised      PT LONG TERM GOAL #2   Title  Pt will ambulate 1000' outside over paved and grassy surfaces without AD, independently    Time  8    Period  Weeks    Status  Revised      PT LONG TERM GOAL #3   Title  Pt will improve FGA score by 4 points to indicate decreased falls risk in the community    Baseline  18/30 on 03/13/20    Time  8    Period  Weeks    Status  New      PT LONG TERM GOAL #4   Title  Pt will improve B cervical rotation AROM to 65 degrees for functional ROM for driving.    Baseline  40 deg    Time  8    Period  Weeks    Status  Revised      PT LONG TERM GOAL #6   Title  Pt will improve  gait speed without AD to 3.8 ft/sec in order to decr fall risk and improve community mobility.    Time  8    Period  Weeks    Status  Revised      PT LONG TERM GOAL #7   Title  Pt will demonstrate decreased motion sensitivity as indicated by ability to perform bending down to the floor, performing head turns/nods and body turns without any dizziness; pt will also improve use of vestibular system as indicated by ability  to maintain 30 seconds on each condition of the MCTSIB.    Baseline  lightheaded-mild dizziness on MSQ; 4 seconds on conditions 2,3,4; 30 seconds on condition 1 but with increased use of ankle strategy    Time  8    Period  Weeks    Status  On-going      PT LONG TERM GOAL #8   Title  Pt will demonstrate ability to stand without support x 10 minutes to play corn hole and demonstrate ability to hit whiffle golf balls in grass x 10 reps with supervision    Time  8    Period  Weeks    Status  On-going            Plan - 03/31/20 1322    Clinical Impression Statement  Focus of today's skilled session was standing balance strategies on compliant surfaces - with focus on SLS, eyes closed, and weight shifting. Pt tolerated session well, needed intermittent brief rest breaks due to fatigue. Will continue to progress towards LTGs.    Personal Factors and Comorbidities  Past/Current Experience;Profession    Examination-Activity Limitations  Bend;Carry;Dressing;Locomotion Level;Squat;Stairs;Lift;Stand;Transfers;Hygiene/Grooming;Bathing    Examination-Participation Restrictions  Cleaning;Community Activity;Driving;Shop;Laundry    Rehab Potential  Good    PT Frequency  2x / week    PT Duration  8 weeks    PT Treatment/Interventions  ADLs/Self Care Home Management;Canalith Repostioning;DME Instruction;Gait training;Stair training;Functional mobility training;Neuromuscular re-education;Balance training;Therapeutic exercise;Therapeutic activities;Patient/family education;Dry needling;Passive range of motion;Energy conservation;Vestibular;Aquatic Therapy;Moist Heat;Cryotherapy;Manual techniques    PT Next Visit Plan  continue with yoga poses for balance, SLS on compliant surfaces. Gait with no AD over compliant surfaces/obstacles/scanning environment - Wants to be able to play golf and cornhole.  Treadmill training for home. TDN to cervical and paraspinal mm and perform another vestibular assessment.     Consulted and Agree with Plan of Care  Patient       Patient will benefit from skilled therapeutic intervention in order to improve the following deficits and impairments:  Abnormal gait, Decreased activity tolerance, Decreased balance, Decreased coordination, Difficulty walking, Decreased strength, Dizziness, Pain, Decreased mobility, Impaired sensation, Decreased range of motion  Visit Diagnosis: Other symptoms and signs involving the nervous system  Other abnormalities of gait and mobility  Muscle weakness (generalized)  Unsteadiness on feet     Problem List Patient Active Problem List   Diagnosis Date Noted  . Vitamin D deficiency 01/26/2020  . B12 deficiency 01/25/2020  . Anxiety and depression 01/19/2020  . Alcohol use 01/19/2020  . Concussion with loss of consciousness 01/03/2020  . Thyroid nodule 01/03/2020  . Irregular menses 04/28/2019  . Neuropathy 04/28/2019  . Infertility counseling 10/01/2017  . DDD (degenerative disc disease), cervical 11/29/2016  . Hypoglycemia 11/29/2016  . Kidney stone 11/29/2016  . Sinusitis 11/29/2016  . Insomnia 03/08/2016  . Neck pain 03/08/2016  . 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

## 2020-04-04 ENCOUNTER — Ambulatory Visit: Payer: 59 | Attending: Family Medicine | Admitting: Physical Therapy

## 2020-04-04 ENCOUNTER — Encounter: Payer: Self-pay | Admitting: Physical Therapy

## 2020-04-04 ENCOUNTER — Other Ambulatory Visit: Payer: Self-pay

## 2020-04-04 DIAGNOSIS — R2689 Other abnormalities of gait and mobility: Secondary | ICD-10-CM | POA: Insufficient documentation

## 2020-04-04 DIAGNOSIS — M542 Cervicalgia: Secondary | ICD-10-CM | POA: Insufficient documentation

## 2020-04-04 DIAGNOSIS — R29818 Other symptoms and signs involving the nervous system: Secondary | ICD-10-CM | POA: Insufficient documentation

## 2020-04-04 DIAGNOSIS — R296 Repeated falls: Secondary | ICD-10-CM | POA: Diagnosis present

## 2020-04-04 DIAGNOSIS — R42 Dizziness and giddiness: Secondary | ICD-10-CM | POA: Insufficient documentation

## 2020-04-04 DIAGNOSIS — M6281 Muscle weakness (generalized): Secondary | ICD-10-CM | POA: Insufficient documentation

## 2020-04-04 DIAGNOSIS — R260 Ataxic gait: Secondary | ICD-10-CM | POA: Diagnosis present

## 2020-04-04 DIAGNOSIS — R2681 Unsteadiness on feet: Secondary | ICD-10-CM | POA: Insufficient documentation

## 2020-04-04 NOTE — Therapy (Signed)
[facility] [address] [place], [place], [address] Phone: [phone]   Fax:  [phone]  Physical Therapy Treatment  Patient Details  Name: Jane Hendricks MRN: [id number] Date of Birth: 03-11-1981 Referring Provider (PT): Clementeen Graham, MD   Encounter Date: 04/04/2020  PT End of Session - 04/04/20 2256    Visit Number  27    Number of Visits  36    Date for PT Re-Evaluation  05/09/20    Authorization Type  [employer] - $40 copay - VL: 60 (PT/OT/ST combined)    Authorization - Visit Number  27    Authorization - Number of Visits  60    PT Start Time  1618    PT Stop Time  1658    PT Time Calculation (min)  40 min    Equipment Utilized During Treatment  Gait belt    Activity Tolerance  Patient tolerated treatment well    Behavior During Therapy  WFL for tasks assessed/performed       Past Medical History:  Diagnosis Date  . Ataxic gait   . Compression fracture of C-spine (HCC) 12/12/2019   C4-C5  . GERD (gastroesophageal reflux disease)   . Herniated disc, cervical   . Hypoglycemia   . Migraines     Past Surgical History:  Procedure Laterality Date  . ANKLE SURGERY    . DILATION AND CURETTAGE OF UTERUS    . WISDOM TOOTH EXTRACTION      There were no vitals filed for this visit.  Subjective Assessment - 04/04/20 1619    Subjective  Played cornhole over the weekend - did well with the boards being at a shorter distance away. When she was going into the work the other day, almost lost her balance because she was carrying too many things.    Pertinent History  PMH: hx of herniated disc in cervical spine    Diagnostic tests  MRI 2/13 cervical spine: No marrow edema to suggest acute cervical spine fracture. Cervical spondylosis as outlined. No more than mild spinal canalstenosis at any level. Multilevel neural foraminal narrowinggreatest on the left at C4-C5 and bilaterally at C5-C6(moderate/advanced at these  sites).MRI brain: Normal MRI appearance of the brain for age. No evidence of acuteintracranial abnormality    Patient Stated Goals  be able to walk and drive again to be more independent; to play golf and cornhole again    Currently in Pain?  --   "normal neck pain"                       [facility] Adult PT Treatment/Exercise - 04/04/20 0001      Neuro Re-ed    Neuro Re-ed Details   On red/blue compliant mats at countertop: forward marching down and back x2 reps with no UE support, forward marching with contralateral arm lift down and back x2 reps with cues for slowed and controlled SLS, backwards marching down and back x3 reps, forward tandem gait x3 reps, retro tandem gait x3 reps, SLS stepping on 8 riverstones of different sizes/heights x2 reps then adding cognitive challenge of pt naming foods skipping every other letter of the alphabet x3 reps - min guard/min A for all balance activities       Exercises   Exercises  Other Exercises    Other Exercises   pt reporting recently joining a gym and asking what kind of aerobic machines that she should be using. pt originally asking about  the stair stepper, discussed with pt's balance at this time that getting on/off that equipment would not be the safest at this time. performed the elliptical x3 minutes at gear 1.0 with pt able to demonstrating getting on and off x2 reps with supervision, discussed making sure pt using an elliptical with stable arms in order to safely get on/off. also tried the treadmill x3 minutes at level 2.0 with BUE support, once getting off pt reporting minimal dizziness that subsided immediately. discussed for pt to potentially hold off on treadmill and just try to utilize NuStep machines and the elliptical for now            Balance Exercises - 04/04/20 2257      Balance Exercises: Standing   Other Standing Exercises  Standing in corner on thicker blue foam: feet together eyes closed 3  x 30 seconds, slow  marching with no UE support x10 reps B, on rockerboard in A/P direction: eyes closed 2 x 20 seconds, eyes closed head turns R/L 2 x 5 reps with incr postural sway and use of hip strategy to maintain balance.         PT Education - 04/04/20 2256    Education Details  use of aerobic equipment at the gym    Person(s) Educated  Patient    Methods  Explanation;Demonstration    Comprehension  Verbalized understanding;Returned demonstration       PT Short Term Goals - 03/13/20 1254      PT SHORT TERM GOAL #1   Title  Pt will undergo assessment of FGA with no AD.    Baseline  scored 18/30 on 03/13/20    Time  4    Period  Weeks    Status  Achieved    Target Date  04/09/20      PT SHORT TERM GOAL #2   Title  Pt will demonstrate independence with ongoing HEP    Time  4    Period  Weeks    Status  New    Target Date  04/09/20      PT SHORT TERM GOAL #3   Title  Pt will initiate aquatic therapy    Time  4    Period  Weeks    Status  New    Target Date  04/09/20      PT SHORT TERM GOAL #4   Title  Pt will improve gait speed without AD to at least 3.2 ft/sec in order to improve community mobility.    Baseline  2.9 ft/sec    Time  4    Period  Weeks    Status  Revised    Target Date  04/09/20      PT SHORT TERM GOAL #5   Title  Pt will improve B cervical rotation AROM to at least 50 degrees with mild pain in order to improve functional mobility.    Baseline  40 deg bilaterally    Time  4    Period  Weeks    Status  Revised    Target Date  04/09/20        PT Long Term Goals - 03/13/20 1605      PT LONG TERM GOAL #1   Title  Pt will be independent with final HEP in order to build upon functional gains made in therapy.    Time  8    Period  Weeks    Status  Revised      PT LONG  TERM GOAL #2   Title  Pt will ambulate 1000' outside over paved and grassy surfaces without AD, independently    Time  8    Period  Weeks    Status  Revised      PT LONG TERM GOAL #3   Title   Pt will improve FGA score by 4 points to indicate decreased falls risk in the community    Baseline  18/30 on 03/13/20    Time  8    Period  Weeks    Status  New      PT LONG TERM GOAL #4   Title  Pt will improve B cervical rotation AROM to 65 degrees for functional ROM for driving.    Baseline  40 deg    Time  8    Period  Weeks    Status  Revised      PT LONG TERM GOAL #6   Title  Pt will improve gait speed without AD to 3.8 ft/sec in order to decr fall risk and improve community mobility.    Time  8    Period  Weeks    Status  Revised      PT LONG TERM GOAL #7   Title  Pt will demonstrate decreased motion sensitivity as indicated by ability to perform bending down to the floor, performing head turns/nods and body turns without any dizziness; pt will also improve use of vestibular system as indicated by ability to maintain 30 seconds on each condition of the MCTSIB.    Baseline  lightheaded-mild dizziness on MSQ; 4 seconds on conditions 2,3,4; 30 seconds on condition 1 but with increased use of ankle strategy    Time  8    Period  Weeks    Status  On-going      PT LONG TERM GOAL #8   Title  Pt will demonstrate ability to stand without support x 10 minutes to play corn hole and demonstrate ability to hit whiffle golf balls in grass x 10 reps with supervision    Time  8    Period  Weeks    Status  On-going            Plan - 04/04/20 2300    Clinical Impression Statement  Pt reporting joining a gym and is interested in aerobic equipment. Trialed the elliptical today with pt reporting no dizziness and able to get on/off safely with supervision. Remainder of session focused on balance strategies on compliant surfaces with focus on SLS. Pt tolerated well and demonstrated improvements with incr reps. Will continue to progress towards LTGs.    Personal Factors and Comorbidities  Past/Current Experience;Profession    Examination-Activity Limitations  Bend;Carry;Dressing;Locomotion  Level;Squat;Stairs;Lift;Stand;Transfers;Hygiene/Grooming;Bathing    Examination-Participation Restrictions  Cleaning;Community Activity;Driving;Shop;Laundry    Rehab Potential  Good    PT Frequency  2x / week    PT Duration  8 weeks    PT Treatment/Interventions  ADLs/Self Care Home Management;Canalith Repostioning;DME Instruction;Gait training;Stair training;Functional mobility training;Neuromuscular re-education;Balance training;Therapeutic exercise;Therapeutic activities;Patient/family education;Dry needling;Passive range of motion;Energy conservation;Vestibular;Aquatic Therapy;Moist Heat;Cryotherapy;Manual techniques    PT Next Visit Plan  perform elliptical x8 minutes, check goals. continue with yoga poses for balance, SLS on compliant surfaces. Gait with no AD over compliant surfaces/obstacles/scanning environment - Wants to be able to play golf and cornhole.  Treadmill training for home. TDN to cervical and paraspinal mm and perform another vestibular assessment.    Consulted and Agree with Plan of Care  Patient  Patient will benefit from skilled therapeutic intervention in order to improve the following deficits and impairments:  Abnormal gait, Decreased activity tolerance, Decreased balance, Decreased coordination, Difficulty walking, Decreased strength, Dizziness, Pain, Decreased mobility, Impaired sensation, Decreased range of motion  Visit Diagnosis: Other symptoms and signs involving the nervous system  Other abnormalities of gait and mobility  Muscle weakness (generalized)  Unsteadiness on feet     Problem List Patient Active Problem List   Diagnosis Date Noted  . Vitamin D deficiency 01/26/2020  . B12 deficiency 01/25/2020  . Anxiety and depression 01/19/2020  . Alcohol use 01/19/2020  . Concussion with loss of consciousness 01/03/2020  . Thyroid nodule 01/03/2020  . Irregular menses 04/28/2019  . Neuropathy 04/28/2019  . Infertility counseling 10/01/2017  .  DDD (degenerative disc disease), cervical 11/29/2016  . Hypoglycemia 11/29/2016  . Kidney stone 11/29/2016  . Sinusitis 11/29/2016  . Insomnia 03/08/2016  . Neck pain 03/08/2016  . Parasomnia 03/08/2016    Drake Leach, PT, DPT  04/04/2020, 11:01 PM  [facility] [address] [place], [place], [address] Phone: [phone]   Fax:  [phone]  Name: Jane Hendricks MRN: [id number] Date of Birth: [birth year]

## 2020-04-05 ENCOUNTER — Ambulatory Visit: Payer: 59 | Admitting: Physical Therapy

## 2020-04-05 DIAGNOSIS — R2689 Other abnormalities of gait and mobility: Secondary | ICD-10-CM

## 2020-04-05 DIAGNOSIS — R2681 Unsteadiness on feet: Secondary | ICD-10-CM

## 2020-04-05 DIAGNOSIS — M6281 Muscle weakness (generalized): Secondary | ICD-10-CM

## 2020-04-05 DIAGNOSIS — R29818 Other symptoms and signs involving the nervous system: Secondary | ICD-10-CM
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
## 2020-04-07 ENCOUNTER — Ambulatory Visit (INDEPENDENT_AMBULATORY_CARE_PROVIDER_SITE_OTHER): Payer: 59 | Admitting: Family Medicine

## 2020-04-07 ENCOUNTER — Encounter: Payer: Self-pay | Admitting: Family Medicine

## 2020-04-07 ENCOUNTER — Other Ambulatory Visit: Payer: Self-pay

## 2020-04-07 VITALS — BP 110/78 | HR 87 | Ht 65.5 in | Wt 135.8 lb

## 2020-04-07 DIAGNOSIS — M542 Cervicalgia: Secondary | ICD-10-CM

## 2020-04-07 DIAGNOSIS — F329 Major depressive disorder, single episode, unspecified: Secondary | ICD-10-CM | POA: Diagnosis not present

## 2020-04-07 DIAGNOSIS — F419 Anxiety disorder, unspecified: Secondary | ICD-10-CM

## 2020-04-07 DIAGNOSIS — S060X9D Concussion with loss of consciousness of unspecified duration, subsequent encounter: Secondary | ICD-10-CM

## 2020-04-07 MED ORDER — CELECOXIB 200 MG PO CAPS: 200.0000 mg | ORAL_CAPSULE | Freq: Two times a day (BID) | ORAL | 1 refills | Status: DC

## 2020-04-07 MED ORDER — CYCLOBENZAPRINE HCL 10 MG PO TABS: 5.0000 mg | ORAL_TABLET | Freq: Two times a day (BID) | ORAL | 3 refills | Status: DC | PRN

## 2020-04-07 NOTE — Patient Instructions (Signed)
Thank you for coming in today. Plan to continue PT as you continue to improve.  I can order more.  Sam or your psychiatrist should take over the psychiatric medications.   Recheck in 3 months.  Let me know if you need something before then.
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
## 2020-04-10 ENCOUNTER — Ambulatory Visit: Payer: 59 | Admitting: Physical Therapy

## 2020-04-12 ENCOUNTER — Ambulatory Visit: Payer: 59 | Admitting: Physical Therapy

## 2020-04-12 ENCOUNTER — Other Ambulatory Visit: Payer: Self-pay

## 2020-04-12 ENCOUNTER — Encounter: Payer: Self-pay | Admitting: Physical Therapy

## 2020-04-12 DIAGNOSIS — R2689 Other abnormalities of gait and mobility: Secondary | ICD-10-CM

## 2020-04-12 DIAGNOSIS — M6281 Muscle weakness (generalized): Secondary | ICD-10-CM

## 2020-04-12 DIAGNOSIS — R29818 Other symptoms and signs involving the nervous system: Secondary | ICD-10-CM | POA: Diagnosis not present

## 2020-04-12 DIAGNOSIS — R2681 Unsteadiness on feet: Secondary | ICD-10-CM
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
## 2020-04-14 ENCOUNTER — Ambulatory Visit: Payer: 59 | Admitting: Physician Assistant

## 2020-04-18 ENCOUNTER — Ambulatory Visit: Payer: 59 | Admitting: Physical Therapy

## 2020-04-18 ENCOUNTER — Ambulatory Visit: Payer: 59 | Admitting: Speech Pathology

## 2020-04-18 ENCOUNTER — Other Ambulatory Visit: Payer: Self-pay

## 2020-04-18 DIAGNOSIS — M6281 Muscle weakness (generalized): Secondary | ICD-10-CM

## 2020-04-18 DIAGNOSIS — R29818 Other symptoms and signs involving the nervous system: Secondary | ICD-10-CM

## 2020-04-18 DIAGNOSIS — R42 Dizziness and giddiness: Secondary | ICD-10-CM

## 2020-04-18 DIAGNOSIS — R2689 Other abnormalities of gait and mobility: Secondary | ICD-10-CM

## 2020-04-18 DIAGNOSIS — R2681 Unsteadiness on feet: Secondary | ICD-10-CM

## 2020-04-18 NOTE — Patient Instructions (Signed)
Access Code: [email or web address] URL: [email or web address] Date: 04/18/2020 Prepared by: Sherlie Ban  Exercises Seated Cervical Rotation AROM - 1 x daily - 7 x weekly - 4-5 reps - 4 sets Seated Cervical Sidebending AROM - 1 x daily - 7 x weekly - 4-5 reps - 4 sets Standing Marching - 1 x daily - 5 x weekly - 3 sets Standing Gaze Stabilization with Head Rotation - 1 x daily - 5 x weekly - 2 sets - 30 hold Standing Gaze Stabilization with Head Nod - 1 x daily - 5 x weekly - 2 sets - 10 reps Tandem Walking - 1-2 x daily - 5 x weekly - 3 sets Standing Hip Abduction with Counter Support - 1 x daily - 5 x weekly - 2 sets - 10 reps Romberg Stance Eyes Closed on Foam Pad - 1 x daily - 5 x weekly - 2 sets - 10 reps

## 2020-04-18 NOTE — Therapy (Signed)
[facility] [address] [place], [place], [address] Phone: [phone]   Fax:  [phone]  Physical Therapy Treatment  Patient Details  Name: Jane Hendricks MRN: [id number] Date of Birth: 07/23/1981 Referring Provider (PT): Lynne Leader, MD   Encounter Date: 04/18/2020   PT End of Session - 04/18/20 1344    Visit Number 30    Number of Visits 36    Date for PT Re-Evaluation 05/09/20    Authorization Type [employer] - $40 copay - VL: 60 (PT/OT/ST combined)    Authorization - Visit Number 30    Authorization - Number of Visits 60    PT Start Time 1237   pt arrived late   PT Stop Time 1316    PT Time Calculation (min) 39 min    Equipment Utilized During Treatment Gait belt    Activity Tolerance Patient tolerated treatment well    Behavior During Therapy WFL for tasks assessed/performed           Past Medical History:  Diagnosis Date  . Ataxic gait   . Compression fracture of C-spine ([place]) 12/12/2019   C4-C5  . GERD (gastroesophageal reflux disease)   . Herniated disc, cervical   . Hypoglycemia   . Migraines     Past Surgical History:  Procedure Laterality Date  . ANKLE SURGERY    . DILATION AND CURETTAGE OF UTERUS    . WISDOM TOOTH EXTRACTION      There were no vitals filed for this visit.   Subjective Assessment - 04/18/20 1239    Subjective Brought her golf clubs in today. No falls. Went to the gym once - went on the elliptical for about 7 minutes, felt wobbly getting off and walking to the equipment station. Has been using the massage chair for her neck which has been helping. Saw Dr. Georgina Snell - doesn't have to follow up for 3 months.    Pertinent History PMH: hx of herniated disc in cervical spine    Diagnostic tests MRI 2/13 cervical spine: No marrow edema to suggest acute cervical spine fracture. Cervical spondylosis as outlined. No more than mild spinal canalstenosis at any level. Multilevel neural  foraminal narrowinggreatest on the left at C4-C5 and bilaterally at C5-C6(moderate/advanced at these sites).MRI brain: Normal MRI appearance of the brain for age. No evidence of acuteintracranial abnormality    Patient Stated Goals be able to walk and drive again to be more independent; to play golf and cornhole again    Currently in Pain? --   "just normal pain"                            [facility] Adult PT Treatment/Exercise - 04/18/20 0001      Ambulation/Gait   Ambulation/Gait Yes    Ambulation/Gait Assistance 5: Supervision    Ambulation/Gait Assistance Details pt needing closer supervision after trampoline balance activities for first 59' of gait due to pt reporting feeling unsteady, needing verbal cues for B arm swing and looking 10' ahead/up vs. glancing down at floor    Assistive device None    Gait Pattern Step-through pattern;Narrow base of support    Ambulation Surface Level;Indoor;Unlevel;Outdoor;Grass      Therapeutic Activites    Therapeutic Activities Other Therapeutic Activities    Other Therapeutic Activities Discussed with pt after using elliptical at the gym, to stand and weight shift and ground herself while still holding onto elliptical for balance before going  to walk towards cleaning equipment, due to feeling unsteady after performing in the gym. Practiced when getting off the trampoline by holding onto handrail and weight shifting before ambulating. Also discussed with pt, beginning to hit balls at driving range due to no LOB while performing with driver today outside on grass surfaces. Discussed about trying to make sure that pt has a seat or somewhere she can sit near by in order to take rest breaks while at driving range.       Neuro Re-ed    Neuro Re-ed Details  outdoors on grass: pt swinging driver to hit golf balls x7 reps with no LOB and with mod I, pt improving whiffle golf ball distance with each rep, afterwards pt performing mini squats to  retrieve golf balls from grass with no LOB           Vestibular Treatment/Exercise - 04/18/20 0001      X1 Viewing Horizontal   Foot Position standing with feet apart no UE support    Reps 3    Comments x30 seconds, mild symptoms - reviewed for HEP      X1 Viewing Vertical   Foot Position standing with BUE support, feet apart    Comments x30 seconds (pt with moderate 5/10 sx and reporting blurry vision), then 2 x 10 reps (performed in 15 seconds) with pt reporting mild 3/10 dizziness, revised for HEP               Balance Exercises - 04/18/20 0001      Balance Exercises: Standing   Other Standing Exercises Comments Standing on trampoline with UE support on stair case: lateral weight shifting progressing to no UE support, A/P weighting through heels and then onto toes with BUE support and then none, gentle hops with BUE support (pt reporting 2-3/10 dizziness after performing, subsided with standing break), slow marching beginning with BUE support and then none x10 reps, hops out and then in with BUE support x10 reps - min guard for all activities              PT Education - 04/18/20 1343    Education Details see TA, reviewed standing VOR exercise for HEP (as pt had not been performing at home)    Person(s) Educated Patient    Methods Explanation;Demonstration    Comprehension Verbalized understanding;Returned demonstration            PT Short Term Goals - 04/05/20 1626      PT SHORT TERM GOAL #1   Title Pt will undergo assessment of FGA with no AD.    Baseline scored 18/30 on 03/13/20    Time 4    Period Weeks    Status Achieved    Target Date 04/09/20      PT SHORT TERM GOAL #2   Title Pt will demonstrate independence with ongoing HEP    Baseline unable to assess today due to time constraints    Time 4    Period Weeks    Status New    Target Date 04/09/20      PT SHORT TERM GOAL #3   Title Pt will initiate aquatic therapy    Time 4    Period Weeks     Status Achieved    Target Date 04/09/20      PT SHORT TERM GOAL #4   Title Pt will improve gait speed without AD to at least 3.2 ft/sec in order to improve community mobility.  Baseline 10.34 seconds = 3.17 ft/sec    Time 4    Period Weeks    Status Achieved    Target Date 04/09/20      PT SHORT TERM GOAL #5   Title Pt will improve B cervical rotation AROM to at least 50 degrees with mild pain in order to improve functional mobility.    Baseline 50 deg to R, 47 deg to L    Time 4    Period Weeks    Status Partially Met    Target Date 04/09/20             PT Long Term Goals - 04/06/20 0851      PT LONG TERM GOAL #1   Title Pt will be independent with final HEP in order to build upon functional gains made in therapy.    Time 8    Period Weeks    Status Revised      PT LONG TERM GOAL #2   Title Pt will ambulate 1000' outside over paved and grassy surfaces without AD, independently    Time 8    Period Weeks    Status Revised      PT LONG TERM GOAL #3   Title Pt will improve FGA score to at least a 25/30 to indicate decreased falls risk in the community    Baseline 22/30 on 04/06/20    Time 8    Period Weeks    Status Revised      PT LONG TERM GOAL #4   Title Pt will improve B cervical rotation AROM to 65 degrees for functional ROM for driving.    Baseline 40 deg    Time 8    Period Weeks    Status Revised      PT LONG TERM GOAL #6   Title Pt will improve gait speed without AD to 3.8 ft/sec in order to decr fall risk and improve community mobility.    Time 8    Period Weeks    Status Revised      PT LONG TERM GOAL #7   Title Pt will demonstrate decreased motion sensitivity as indicated by ability to perform bending down to the floor, performing head turns/nods and body turns without any dizziness; pt will also improve use of vestibular system as indicated by ability to maintain 30 seconds on each condition of the MCTSIB.    Baseline lightheaded-mild dizziness on  MSQ; 4 seconds on conditions 2,3,4; 30 seconds on condition 1 but with increased use of ankle strategy    Time 8    Period Weeks    Status On-going      PT LONG TERM GOAL #8   Title Pt will demonstrate ability to stand without support x 10 minutes to play corn hole and demonstrate ability to hit whiffle golf balls in grass x 10 reps with supervision    Time 8    Period Weeks    Status On-going                 Plan - 04/18/20 1353    Clinical Impression Statement Reviewed standing VOR exercises as pt had not been performing at home. Pt able to perform VOR x1 with head turns with no UE support and for 30 seconds with min symptoms, however needs BUE support and can only perform for 10 reps for vertical head movement. Pt brought in golf club today, pt able to hit whiffle golf balls with driver club with  no LOB and mod I. Discussed with pt that she can go to driving range and take seated rest breaks as needed. Pt with more unsteadiness after trampoline exercises today and demonstrating initially a more narrow BOS and decr arm swing that improved with gait distance. Will continue to progress towards LTGs.    Personal Factors and Comorbidities Past/Current Experience;Profession    Examination-Activity Limitations Bend;Carry;Dressing;Locomotion Level;Squat;Stairs;Lift;Stand;Transfers;Hygiene/Grooming;Bathing    Examination-Participation Restrictions Cleaning;Community Activity;Driving;Shop;Laundry    Rehab Potential Good    PT Frequency 2x / week    PT Duration 8 weeks    PT Treatment/Interventions ADLs/Self Care Home Management;Canalith Repostioning;DME Instruction;Gait training;Stair training;Functional mobility training;Neuromuscular re-education;Balance training;Therapeutic exercise;Therapeutic activities;Patient/family education;Dry needling;Passive range of motion;Energy conservation;Vestibular;Aquatic Therapy;Moist Heat;Cryotherapy;Manual techniques    PT Next Visit Plan  elliptical/treadmill. continue with yoga poses for balance, SLS on compliant surfaces. Gait with no AD over compliant surfaces/obstacles/scanning environment - Wants to be able to play golf and cornhole.  Treadmill training for home. TDN to cervical and paraspinal mm and perform another vestibular assessment.    Consulted and Agree with Plan of Care Patient           Patient will benefit from skilled therapeutic intervention in order to improve the following deficits and impairments:  Abnormal gait, Decreased activity tolerance, Decreased balance, Decreased coordination, Difficulty walking, Decreased strength, Dizziness, Pain, Decreased mobility, Impaired sensation, Decreased range of motion  Visit Diagnosis: Other abnormalities of gait and mobility  Other symptoms and signs involving the nervous system  Muscle weakness (generalized)  Unsteadiness on feet  Dizziness and giddiness     Problem List Patient Active Problem List   Diagnosis Date Noted  . Vitamin D deficiency 01/26/2020  . B12 deficiency 01/25/2020  . Anxiety and depression 01/19/2020  . Alcohol use 01/19/2020  . Concussion with loss of consciousness 01/03/2020  . Thyroid nodule 01/03/2020  . Irregular menses 04/28/2019  . Neuropathy 04/28/2019  . Infertility counseling 10/01/2017  . DDD (degenerative disc disease), cervical 11/29/2016  . Hypoglycemia 11/29/2016  . Kidney stone 11/29/2016  . Sinusitis 11/29/2016  . Insomnia 03/08/2016  . Neck pain 03/08/2016  . 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

## 2020-04-19 ENCOUNTER — Encounter: Payer: Self-pay | Admitting: Physician Assistant

## 2020-04-19 ENCOUNTER — Ambulatory Visit: Payer: 59 | Admitting: Physician Assistant

## 2020-04-19 ENCOUNTER — Other Ambulatory Visit (HOSPITAL_COMMUNITY)
Admission: RE | Admit: 2020-04-19 | Discharge: 2020-04-19 | Disposition: A | Discharge status: Home / Self Care | Payer: 59 | Source: Ambulatory Visit | Attending: Physician Assistant | Admitting: Physician Assistant

## 2020-04-19 ENCOUNTER — Ambulatory Visit: Payer: 59 | Admitting: Physical Therapy

## 2020-04-19 VITALS — BP 140/86 | HR 87 | Temp 98.5°F | Ht 65.5 in | Wt 138.5 lb

## 2020-04-19 DIAGNOSIS — R040 Epistaxis: Secondary | ICD-10-CM | POA: Diagnosis not present

## 2020-04-19 DIAGNOSIS — F329 Major depressive disorder, single episode, unspecified: Secondary | ICD-10-CM

## 2020-04-19 DIAGNOSIS — Z113 Encounter for screening for infections with a predominantly sexual mode of transmission: Secondary | ICD-10-CM | POA: Insufficient documentation

## 2020-04-19 DIAGNOSIS — L723 Sebaceous cyst: Secondary | ICD-10-CM

## 2020-04-19 DIAGNOSIS — F32A Depression, unspecified: Secondary | ICD-10-CM

## 2020-04-19 DIAGNOSIS — F419 Anxiety disorder, unspecified: Secondary | ICD-10-CM

## 2020-04-19 DIAGNOSIS — E538 Deficiency of other specified B group vitamins: Secondary | ICD-10-CM

## 2020-04-19 MED ORDER — MUPIROCIN 2 % EX OINT: TOPICAL_OINTMENT | CUTANEOUS | 0 refills | Status: DC

## 2020-04-19 NOTE — Progress Notes (Signed)
Jane Hendricks is a 39 y.o. female is here to discuss:  I acted as a Education administrator for [employer], PA-C Anselmo Pickler, LPN   History of Present Illness:   Chief Complaint  Patient presents with  . Anxiety  . Depression    HPI   Anxiety & Depression She started seeing Merian Capron, psychiatrist -- saw last month and was started on Lamictal. She continues on Wellbutrin XL 150 mg daily, Buspar 7.5 mg BID, Klonopin 0.5 mg BID, Prozac 40 mg. Pt says she is feeling better. Denies SI/HI. Pt is seeing psychiatrist for first f/u tomorrow and has an appt with therapist on July 6th. She also started aquatic therapy.  Recurrent epistaxis For 1 year has had recurrent nosebleeds/bloody nostrils each morning when waking up. Sometimes has clots. Has difficulty getting air into her nose at times. Feels better after getting out of hot shower but then symptoms return. She doesn't have any significant "pouring" of blood out of nostrils but still has streaking at times. Feels like she has sores in her nose.  STD screening Recently began to be intimate with her husband after several months. Denies symptoms. Husband has history of cheating. She would like screening today.  B12 Deficiency Has been doing B12 injections on her own without any concerns. Last B12 was 130. Has ongoing neuropathy that has not been improving -- neurology is following.  Lab Results  Component Value Date   VITAMINB12 130 (L) 01/13/2020   Sebaceous Cyst Has sebaceous cyst on her back that she would like removed. Not inflamed/infected. Getting bigger with time.    Health Maintenance Due  Topic Date Due  . Hepatitis C Screening  Never done  . COVID-19 Vaccine (1) Never done  . HIV Screening  Never done    Past Medical History:  Diagnosis Date  . Ataxic gait   . Compression fracture of C-spine ([place] [place]) 12/12/2019   C4-C5  . GERD (gastroesophageal reflux disease)   . Herniated disc, cervical   . Hypoglycemia   .  Migraines      Social History   Tobacco Use  . Smoking status: Never Smoker  . Smokeless tobacco: Never Used  Vaping Use  . Vaping Use: Never used  Substance Use Topics  . Alcohol use: Not Currently    Alcohol/week: 16.0 standard drinks    Types: 16 Shots of liquor per week    Comment: typically 4 shots 4days per week, Pt has stopped 12/2019  . Drug use: No    Past Surgical History:  Procedure Laterality Date  . ANKLE SURGERY    . DILATION AND CURETTAGE OF UTERUS    . WISDOM TOOTH EXTRACTION      Family History  Problem Relation Age of Onset  . Osteoarthritis Mother   . Hyperlipidemia Mother   . Hypertension Mother   . Kidney disease Father   . Osteoarthritis Maternal Grandmother   . COPD Maternal Grandmother   . Osteoarthritis Maternal Grandfather   . Lung cancer Maternal Grandfather   . Diabetes Maternal Grandfather   . Hyperlipidemia Maternal Grandfather   . Hypertension Maternal Grandfather   . Osteoarthritis Paternal Grandmother   . COPD Paternal Grandmother   . Osteoarthritis Paternal Grandfather   . Colon cancer Paternal Grandfather        after age 67  . Lung cancer Maternal Aunt        mets to brain  . Breast cancer Neg Hx     PMHx, SurgHx, SocialHx, FamHx,  Medications, and Allergies were reviewed in the Visit Navigator and updated as appropriate.   Patient Active Problem List   Diagnosis Date Noted  . Vitamin D deficiency 01/26/2020  . B12 deficiency 01/25/2020  . Anxiety and depression 01/19/2020  . Alcohol use 01/19/2020  . Concussion with loss of consciousness 01/03/2020  . Thyroid nodule 01/03/2020  . Irregular menses 04/28/2019  . Neuropathy 04/28/2019  . Infertility counseling 10/01/2017  . DDD (degenerative disc disease), cervical 11/29/2016  . Hypoglycemia 11/29/2016  . Kidney stone 11/29/2016  . Sinusitis 11/29/2016  . Insomnia 03/08/2016  . Neck pain 03/08/2016  . 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  Smoking status: Never Smoker  . Smokeless tobacco: Never Used  Vaping Use  . Vaping Use: Never used  Substance Use Topics  . Alcohol use: Not Currently    Alcohol/week: 16.0 standard drinks    Types: 16 Shots of liquor per week    Comment: typically 4 shots 4days per week, Pt has stopped 12/2019  . Drug use: No    Current Medications and Allergies:    Current Outpatient Medications:  .  acetaminophen (TYLENOL) 500 MG tablet, Take 1,000 mg by mouth every 6 (six) hours as needed., Disp: , Rfl:  .  AMBULATORY NON FORMULARY MEDICATION, Walker  Disp 1 Use as needed. Ataxia R27.0, Disp: 1 each, Rfl: 0 .  Ascorbic Acid (VITA-C PO), Take 2 each by mouth daily., Disp: , Rfl:  .  B Complex Vitamins (B COMPLEX-B12 PO), Take 1 tablet by mouth daily., Disp: , Rfl:  .  Biotin w/ Vitamins C & E (HAIR SKIN & NAILS GUMMIES PO), Take 2 each by mouth daily., Disp: , Rfl:  .  buPROPion (WELLBUTRIN XL) 150 MG 24 hr tablet, Take 1 tablet (150 mg total) by mouth every morning., Disp: 90 tablet, Rfl: 2 .  busPIRone (BUSPAR) 7.5 MG tablet, Take 1 tablet (7.5 mg total) by mouth 2 (two) times daily., Disp: 60 tablet, Rfl: 0 .  Calcium Carbonate (CALCIUM 500 PO), Take 2 tablets by mouth daily., Disp: , Rfl:  .  celecoxib (CELEBREX) 200 MG capsule, Take 1 capsule (200 mg total) by mouth 2 (two) times daily., Disp: 180 capsule, Rfl: 1 .  clonazePAM (KLONOPIN) 0.5 MG tablet, Take 1 tablet (0.5 mg total) by mouth 2 (two) times daily., Disp: 60 tablet, Rfl: 1 .  cyanocobalamin (,VITAMIN B-12,) 1000 MCG/ML injection, 1000 mcg (1 mg) injection once per week for four weeks, followed by 1000 mcg injection once per month., Disp: 1 mL, Rfl: 15 .  cyclobenzaprine (FLEXERIL) 10 MG tablet, Take 0.5-1 tablets (5-10 mg total) by mouth 2 (two) times daily as needed for muscle spasms., Disp: 60 tablet, Rfl: 3 .  famotidine (PEPCID) 10 MG tablet, Take 10 mg by mouth daily. , Disp: , Rfl:  .  Fiber Adult Gummies 2 g CHEW, Chew 2 each  by mouth daily., Disp: , Rfl:  .  FLUoxetine (PROZAC) 40 MG capsule, Take 40 mg by mouth daily., Disp: , Rfl:  .  ibuprofen (ADVIL) 200 MG tablet, Take 400 mg by mouth every 6 (six) hours as needed., Disp: , Rfl:  .  Lactobacillus-Inulin (PROBIOTIC DIGESTIVE SUPPORT PO), Take 2 each by mouth daily., Disp: , Rfl:  .  lamoTRIgine (LAMICTAL) 25 MG tablet, Take 1 tablet (25 mg total) by mouth daily. Take one tablet daily for a week and then start taking 2 tablets., Disp: 60 tablet, Rfl: 0 .  lidocaine (LIDODERM) 5 %, Place 1 patch onto the skin daily. Remove & Discard patch within 12 hours or as directed by MD, Disp: , Rfl:  .  meclizine (ANTIVERT) 25 MG tablet, Take 1 tablet (25 mg total) by mouth 3 (three) times daily as needed for dizziness or nausea., Disp: 30 tablet, Rfl: 3 .  Melatonin 5 MG CHEW, Chew 1 each by mouth daily., Disp: , Rfl:  .  Multiple Vitamins-Minerals (ONE-A-DAY WOMENS PO), Take 1 tablet by mouth daily., Disp: , Rfl:  .  traZODone (DESYREL) 50 MG tablet, Take 2 tablets (100 mg total) by mouth at bedtime as needed for sleep. (Patient taking differently: Take 50 mg by mouth at bedtime as needed for sleep. ), Disp: 90 tablet, Rfl: 1 .  VITAMIN D PO, Take 1 capsule by mouth daily., Disp: , Rfl:  .  Vitamin D, Ergocalciferol, (DRISDOL) 1.25 MG (50000 UNIT) CAPS capsule, Take 1 capsule (50,000 Units total) by mouth every 7 (seven) days., Disp: 8 capsule, Rfl: 0 .  vitamin E (VITAMIN E) 180 MG (400 UNITS) capsule, Take 400 Units by mouth daily., Disp: , Rfl:  .  HYDROcodone-acetaminophen (NORCO/VICODIN) 5-325 MG tablet, Take 1 tablet by mouth every 6 (six) hours as needed. (Patient not taking: Reported on 04/07/2020), Disp: 15 tablet, Rfl: 0 .  mupirocin ointment (BACTROBAN) 2 %, Apply to nasal area 1-2 times daily, Disp: 22 g, Rfl: 0   Allergies  Allergen Reactions  . Black Walnut Flavor     Walnuts-tongue swelling    Review of Systems   ROS  Negative unless otherwise specified  per HPI.  Vitals:   Vitals:   04/19/20 1345  BP: 140/86  Pulse: 87  Temp: 98.5 F (36.9 C)  TempSrc: Temporal  SpO2: 97%  Weight: 138 lb 8 oz (62.8 kg)  Height: 5' 5.5" (1.664 m)     Body mass index is 22.7 kg/m.   Physical Exam:    Physical Exam Vitals and nursing note reviewed.  Constitutional:      General: She is not in acute distress.    Appearance: She is well-developed. She is not ill-appearing or toxic-appearing.  HENT:     Head: Normocephalic and atraumatic.     Right Ear: Tympanic membrane, ear canal and external ear normal. Tympanic membrane is not erythematous, retracted or bulging.     Left Ear: Tympanic membrane, ear canal and external ear normal. Tympanic membrane is not erythematous, retracted or bulging.     Nose:     Right Sinus: No maxillary sinus tenderness or frontal sinus tenderness.     Left Sinus: No maxillary sinus tenderness or frontal sinus tenderness.     Comments: Bloody nasal passages without evidence of distinct lesions    Mouth/Throat:     Pharynx: Uvula midline. No posterior oropharyngeal erythema.  Eyes:     General: Lids are normal.     Conjunctiva/sclera: Conjunctivae normal.  Neck:     Trachea: Trachea normal.  Cardiovascular:     Rate and Rhythm: Normal rate and regular rhythm.     Heart sounds: Normal heart sounds, S1 normal and S2 normal.  Pulmonary:     Effort: Pulmonary effort is normal.     Breath sounds: Normal breath sounds. No decreased breath sounds, wheezing, rhonchi or rales.  Lymphadenopathy:     Cervical: No cervical adenopathy.  Skin:    General: Skin is warm and dry.     Comments: 1/2 cm raised flesh colored lesion to  R upper back without TTP or active discharge  Neurological:     Mental Status: She is alert.  Psychiatric:        Speech: Speech normal.        Behavior: Behavior normal. Behavior is cooperative.      Assessment and Plan:    Bobbe was seen today for anxiety and depression.  Diagnoses  and all orders for this visit:  Anxiety and depression Improved. Managed per psych. Hoping to start talk therapy next month.  Epistaxis, recurrent Encouraged use of humidifier at night, nasal saline spray, and also trialing mupirocin.  Screening examination for STD (sexually transmitted disease) Testing today, will contact patient with results. -     Urine cytology ancillary only([facility]) -     HIV Antibody (routine testing w rflx) -     RPR  B12 deficiency Update B12 level today and determine need for further supplementation or change in regimen. -     Vitamin B12  Sebaceous cyst Referral to dermatology for further evaluation and mgmt. -     Ambulatory referral to Dermatology  Other orders -     mupirocin ointment (BACTROBAN) 2 %; Apply to nasal area 1-2 times daily   Reviewed expectations re: course of current medical issues. Discussed self-management of symptoms. Outlined signs and symptoms indicating need for more acute intervention. Patient verbalized understanding and all questions were answered. See orders for this visit as documented in the electronic medical record. Patient received an After Visit Summary.  CMA or LPN served as scribe during this visit. History, Physical, and Plan performed by medical provider. The above documentation has been reviewed and is accurate and complete.   Jarold Motto, PA-C [facility] 04/19/2020  Follow-up: No follow-ups on file.

## 2020-04-19 NOTE — Patient Instructions (Signed)
It was great to see you!  We will be in touch with your lab/urine results.  You will be contacted about your appointment to dermatology.  Please schedule a physical with a pap smear on your way out.  Take care,  Jarold Motto PA-C

## 2020-04-20 ENCOUNTER — Encounter (HOSPITAL_COMMUNITY): Payer: Self-pay | Admitting: Psychiatry

## 2020-04-20 ENCOUNTER — Telehealth (INDEPENDENT_AMBULATORY_CARE_PROVIDER_SITE_OTHER): Payer: 59 | Admitting: Psychiatry

## 2020-04-20 DIAGNOSIS — Z639 Problem related to primary support group, unspecified: Secondary | ICD-10-CM | POA: Diagnosis not present

## 2020-04-20 DIAGNOSIS — F329 Major depressive disorder, single episode, unspecified: Secondary | ICD-10-CM | POA: Diagnosis not present

## 2020-04-20 DIAGNOSIS — F32A Depression, unspecified: Secondary | ICD-10-CM

## 2020-04-20 DIAGNOSIS — F0630 Mood disorder due to known physiological condition, unspecified: Secondary | ICD-10-CM

## 2020-04-20 DIAGNOSIS — F419 Anxiety disorder, unspecified: Secondary | ICD-10-CM | POA: Diagnosis not present

## 2020-04-20 LAB — VITAMIN B12: Vitamin B-12: 510 pg/mL (ref 211–911)

## 2020-04-20 LAB — HIV ANTIBODY (ROUTINE TESTING W REFLEX): HIV 1&2 Ab, 4th Generation: NONREACTIVE

## 2020-04-20 LAB — RPR: RPR Ser Ql: NONREACTIVE

## 2020-04-20 MED ORDER — LAMOTRIGINE 25 MG PO TABS: 50.0000 mg | ORAL_TABLET | Freq: Every day | ORAL | 1 refills | Status: DC
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
## 2020-04-21 ENCOUNTER — Encounter: Payer: Self-pay | Admitting: Physician Assistant

## 2020-04-21 ENCOUNTER — Ambulatory Visit: Payer: 59 | Admitting: Physical Therapy

## 2020-04-21 LAB — URINE CYTOLOGY ANCILLARY ONLY
Chlamydia: NEGATIVE
Comment: NEGATIVE
Comment: NEGATIVE
Comment: NORMAL
Neisseria Gonorrhea: NEGATIVE
Trichomonas: NEGATIVE

## 2020-04-22 ENCOUNTER — Encounter: Payer: Self-pay | Admitting: Family Medicine

## 2020-04-23 ENCOUNTER — Encounter: Payer: Self-pay | Admitting: Physician Assistant

## 2020-04-24 ENCOUNTER — Ambulatory Visit: Payer: 59 | Admitting: Physical Therapy

## 2020-04-24 MED ORDER — BUSPIRONE HCL 7.5 MG PO TABS: 7.5000 mg | ORAL_TABLET | Freq: Two times a day (BID) | ORAL | 6 refills | Status: DC

## 2020-04-26 ENCOUNTER — Ambulatory Visit: Payer: 59 | Admitting: Physical Therapy

## 2020-04-26 ENCOUNTER — Other Ambulatory Visit: Payer: Self-pay

## 2020-04-26 DIAGNOSIS — R2689 Other abnormalities of gait and mobility: Secondary | ICD-10-CM
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
## 2020-04-27 ENCOUNTER — Encounter: Payer: Self-pay | Admitting: Family Medicine

## 2020-04-27 ENCOUNTER — Ambulatory Visit (INDEPENDENT_AMBULATORY_CARE_PROVIDER_SITE_OTHER): Payer: 59 | Admitting: Family Medicine

## 2020-04-27 VITALS — BP 110/84 | HR 94 | Ht 65.5 in | Wt 138.0 lb

## 2020-04-27 DIAGNOSIS — S060X9D Concussion with loss of consciousness of unspecified duration, subsequent encounter: Secondary | ICD-10-CM

## 2020-04-27 DIAGNOSIS — R269 Unspecified abnormalities of gait and mobility: Secondary | ICD-10-CM | POA: Diagnosis not present

## 2020-04-27 NOTE — Patient Instructions (Signed)
Thank you for coming in today.  I dont know why you are worsening.  Plan for neurology on the 7th.  If worsening before that I will do MRI Brain, Cspine and Tspine.  Be extra safe with the walker at home.   Keep me updated.
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
## 2020-04-28 ENCOUNTER — Encounter: Payer: Self-pay | Admitting: Physical Therapy

## 2020-04-28 ENCOUNTER — Ambulatory Visit: Payer: 59 | Admitting: Physical Therapy

## 2020-04-28 ENCOUNTER — Other Ambulatory Visit: Payer: Self-pay

## 2020-04-28 DIAGNOSIS — R29818 Other symptoms and signs involving the nervous system: Secondary | ICD-10-CM | POA: Diagnosis not present

## 2020-04-28 DIAGNOSIS — M6281 Muscle weakness (generalized): Secondary | ICD-10-CM

## 2020-04-28 DIAGNOSIS — R2689 Other abnormalities of gait and mobility: Secondary | ICD-10-CM

## 2020-04-28 DIAGNOSIS — R260 Ataxic gait: Secondary | ICD-10-CM

## 2020-04-28 DIAGNOSIS — R2681 Unsteadiness on feet: Secondary | ICD-10-CM

## 2020-04-28 DIAGNOSIS — R42 Dizziness and giddiness: Secondary | ICD-10-CM

## 2020-04-28 DIAGNOSIS — R296 Repeated falls: Secondary | ICD-10-CM

## 2020-04-28 DIAGNOSIS — M542 Cervicalgia: Secondary | ICD-10-CM

## 2020-04-28 NOTE — Therapy (Addendum)
[facility] [address] [place], [place], [address] Phone: [phone]   Fax:  [phone]  Physical Therapy Treatment  Patient Details  Name: Jane Hendricks MRN: [id number] Date of Birth: 16-Jun-1981 Referring Provider (PT): Clementeen Graham, MD   Encounter Date: 04/28/2020   PT End of Session - 04/28/20 1651    Visit Number 31    Number of Visits 55    Date for PT Re-Evaluation 07/27/20    Authorization Type [employer] - $40 copay - VL: 60 (PT/OT/ST combined)    Authorization - Visit Number 31    Authorization - Number of Visits 60    PT Start Time 1320    PT Stop Time 1405    PT Time Calculation (min) 45 min    Equipment Utilized During Treatment Gait belt    Activity Tolerance Patient tolerated treatment well    Behavior During Therapy WFL for tasks assessed/performed           Past Medical History:  Diagnosis Date  . Ataxic gait   . Compression fracture of C-spine (HCC) 12/12/2019   C4-C5  . GERD (gastroesophageal reflux disease)   . Herniated disc, cervical   . Hypoglycemia   . Migraines     Past Surgical History:  Procedure Laterality Date  . ANKLE SURGERY    . DILATION AND CURETTAGE OF UTERUS    . WISDOM TOOTH EXTRACTION      There were no vitals filed for this visit.   Subjective Assessment - 04/28/20 1325    Subjective Has continued to do things for herself each day but has had a big change in coordination and function since last week.  Came out of no where, was sitting at the table and wasn't able to stand up without assistance.  Is back to using the walker all the time and has had multiple falls.  Required husband's assistance to stand from chair to prevent a fall.  Goes to see neurology in two weeks and will undergo further testing to see what may be causing this change.    Pertinent History PMH: hx of herniated disc in cervical spine    Diagnostic tests MRI 2/13 cervical spine: No marrow edema to  suggest acute cervical spine fracture. Cervical spondylosis as outlined. No more than mild spinal canalstenosis at any level. Multilevel neural foraminal narrowinggreatest on the left at C4-C5 and bilaterally at C5-C6(moderate/advanced at these sites).MRI brain: Normal MRI appearance of the brain for age. No evidence of acuteintracranial abnormality    Patient Stated Goals be able to walk and drive again to be more independent; to play golf and cornhole again    Currently in Pain? Yes              [facility] PT Assessment - 04/28/20 1338      Assessment   Medical Diagnosis concussion/imbalance    Referring Provider (PT) Clementeen Graham, MD    Onset Date/Surgical Date 12/12/19    Hand Dominance Right    Prior Therapy previous PT for herniated disc in neck and from ankle surgeries      Precautions   Precautions Fall      Balance Screen   Has the patient fallen in the past 6 months Yes    How many times? 4-5      Prior Function   Level of Independence Independent    Vocation Full time employment    Arts development officer of [employer]  Leisure golf       Observation/Other Assessments   Focus on Therapeutic Outcomes (FOTO)  Not assessed      Coordination   Gross Motor Movements are Fluid and Coordinated No    Coordination and Movement Description Pt demonstrates ataxia in trunk and UE when performing sit > stand and ataxic gait      ROM / Strength   AROM / PROM / Strength AROM      AROM   Overall AROM  Deficits    AROM Assessment Site Cervical    Cervical Flexion 30    Cervical Extension 15    Cervical - Right Side Bend 30    Cervical - Left Side Bend 30    Cervical - Right Rotation 55    Cervical - Left Rotation 40      Transfers   Transfers Sit to Stand;Stand to Sit;Stand Pivot Transfers    Sit to Stand 4: Min assist    Sit to Stand Details (indicate cue type and reason) When standing pt requires therapist to block L foot to prevent foot from  sliding forwards.  Pt demonstrates increased trunk lateral leaning prior to shifting forwards to stand; Min A for balance while stabilizing when first standing    Stand to Sit 4: Min assist    Stand to Sit Details to control stand > sit    Stand Pivot Transfers 4: Min assist    Stand Pivot Transfer Details (indicate cue type and reason) with RW, Min A for balance when pivoting due to ataxia      Ambulation/Gait   Ambulation/Gait Yes    Ambulation/Gait Assistance 4: Min assist    Ambulation/Gait Assistance Details Initially pt demonstrated ataxic gait, increased trunk flexion, shoulder elevation and slowed gait.  As pt continued to ambulate she was able to relax shoulders down, stand with more upright trunk and smoother, reciprocal gait pattern with increased velocity.    Ambulation Distance (Feet) 115 Feet    Assistive device Rolling walker    Gait Pattern Step-through pattern;Ataxic;Trunk flexed    Ambulation Surface Level;Indoor      Standardized Balance Assessment   Standardized Balance Assessment 10 meter walk test    10 Meter Walk 21.47 over 21 feet or .97 ft/sec with RW and min A      Functional Gait  Assessment   Gait assessed  Yes    Gait Level Surface Cannot walk 20 ft without assistance, severe gait deviations or imbalance deviates greater than 15 in outside of the 12 in walkway width or reaches and touches the wall    Change in Gait Speed Makes only minor adjustments to walking speed, or accomplishes a change in speed with significant gait deviations, deviates 10-15 in outside the 12 in walkway width, or changes speed but loses balance but is able to recover and continue walking.    Gait with Horizontal Head Turns Performs head turns smoothly with slight change in gait velocity (eg, minor disruption to smooth gait path), deviates 6-10 in outside 12 in walkway width, or uses an assistive device.    Gait with Vertical Head Turns Performs task with moderate change in gait velocity,  slows down, deviates 10-15 in outside 12 in walkway width but recovers, can continue to walk.    Gait and Pivot Turn Turns slowly, requires verbal cueing, or requires several small steps to catch balance following turn and stop    Step Over Obstacle Cannot perform without assistance.    Gait  with Narrow Base of Support Ambulates less than 4 steps heel to toe or cannot perform without assistance.    Gait with Eyes Closed Cannot walk 20 ft without assistance, severe gait deviations or imbalance, deviates greater than 15 in outside 12 in walkway width or will not attempt task.    Ambulating Backwards Walks 20 ft, slow speed, abnormal gait pattern, evidence for imbalance, deviates 10-15 in outside 12 in walkway width.    Steps Two feet to a stair, must use rail.    Total Score 7    FGA comment: 7/30; returned to use of RW, min A from therapist due to ataxia                                 PT Education - 04/28/20 1650    Education Details decline in outcome measures, adding more visits to continue to address; goals adjusted to reflect change in status    Person(s) Educated Patient    Methods Explanation    Comprehension Verbalized understanding            PT Short Term Goals - 04/29/20 1350      PT SHORT TERM GOAL #1   Title Pt will demonstrate ability to safely perform land and aquatic HEP with husband's supervision    Time 6    Period Weeks    Status Revised    Target Date 06/13/20      PT SHORT TERM GOAL #2   Title Pt will demonstrate 4 point improvement in FGA with LRAD to indicate decreased falls risk    Baseline 7/30    Time 6    Period Weeks    Status Revised    Target Date 06/13/20      PT SHORT TERM GOAL #3   Title Pt will demonstrate ability to enter/exit pool with ladder with supervision to be able to enter/exit pool at home    Time 6    Period Weeks    Status Revised    Target Date 06/13/20      PT SHORT TERM GOAL #4   Title Pt will improve  gait speed with LRAD to >/= 1.4 ft/sec    Baseline .97 ft/sec with RW and Min A    Time 6    Period Weeks    Status Revised    Target Date 06/13/20      PT SHORT TERM GOAL #5   Title Pt will improve cervical extension to >/= 30 deg and bilat rotation to 60 deg to improve mobility for driving    Baseline 15 deg extension; 55 R rotation, 40 L rotation    Time 6    Period Weeks    Status Revised    Target Date 06/13/20      PT SHORT TERM GOAL #6   Title Pt will demonstrate ability to perform sit to stand and stand pivot transfers with LRAD MOD I    Baseline Min A    Time 6    Period Weeks    Status New    Target Date 06/13/20             PT Long Term Goals - 04/29/20 1406      PT LONG TERM GOAL #1   Title Pt will be independent with final HEP in order to build upon functional gains made in therapy.    Time 12    Period  Weeks    Status Revised    Target Date 07/28/20      PT LONG TERM GOAL #2   Title Pt will ambulate 1000' outside over paved and grassy surfaces without AD, independently to be able to return to golfing    Time 12    Period Weeks    Status Revised    Target Date 07/28/20      PT LONG TERM GOAL #3   Title Pt will improve FGA score to at least a 20/30 to indicate decreased falls risk in the community    Time 12    Period Weeks    Status Revised    Target Date 07/28/20      PT LONG TERM GOAL #4   Title Pt will improve B cervical rotation AROM to 65 degrees for functional ROM for driving.    Time 12    Period Weeks    Status Revised    Target Date 07/28/20      PT LONG TERM GOAL #5   Title Pt will demonstrate ability to perform sit <> stand and stand pivot without AD independently    Time 12    Period Weeks    Status New    Target Date 07/28/20      PT LONG TERM GOAL #6   Title Pt will improve gait speed without AD to 2.82ft/sec in order to decr fall risk and improve community mobility.    Time 12    Period Weeks    Status Revised    Target  Date 07/28/20      PT LONG TERM GOAL #7   Title Pt will demonstrate decreased motion sensitivity as indicated by ability to perform bending down to the floor, performing head turns/nods and body turns without any dizziness; pt will also improve use of vestibular system as indicated by ability to maintain 30 seconds on each condition of the MCTSIB.    Baseline lightheaded-mild dizziness on MSQ; 4 seconds on conditions 2,3,4; 30 seconds on condition 1 but with increased use of ankle strategy    Time 12    Period Weeks    Status On-going    Target Date 07/28/20      PT LONG TERM GOAL #8   Title Pt will demonstrate ability to stand without support x 10 minutes to play corn hole and demonstrate ability to hit whiffle golf balls in grass x 10 reps with supervision    Time 12    Period Weeks    Status Revised    Target Date 07/28/20                 Plan - 04/29/20 1410    Clinical Impression Statement Due to significant change in gait, coordination and function at aquatic therapy earlier this week, PT performed reassessment with pt demonstrating decline in cervical extension ROM, slight decrease in rotation ROM, truncal ataxia, ataxic gait, FGA has declined to 7/30 due to use of RW and min A from therapist and gait velocity with RW has declined to .97 ft/sec; pt's transfers have regressed to requiring min A.  Pt reports physician cleared her to continue with land and aquatic therapy while undergoing further testing for sudden change in function.  Goals downgraded and certification extended.  PT to continue to address these impairments to maximize functional mobility independence and decrease falls risk.    Personal Factors and Comorbidities Past/Current Experience;Profession    Examination-Activity Limitations Bend;Carry;Dressing;Locomotion Level;Squat;Stairs;Lift;Stand;Transfers;Hygiene/Grooming;Bathing    Examination-Participation  Restrictions Cleaning;Community Activity;Driving;Shop;Laundry     Rehab Potential Good    PT Frequency 2x / week    PT Duration 12 weeks    PT Treatment/Interventions ADLs/Self Care Home Management;Canalith Repostioning;DME Instruction;Gait training;Stair training;Functional mobility training;Neuromuscular re-education;Balance training;Therapeutic exercise;Therapeutic activities;Patient/family education;Dry needling;Passive range of motion;Vestibular;Aquatic Therapy;Moist Heat;Cryotherapy;Manual techniques    PT Next Visit Plan Revise land HEP for safer exercies.  Re-do vestibular evaluation.  Pt has experienced a significant decline in function - has returned to using RW and min A due to ataxic movements.  Goals downgraded.  In land and aquatic continue to work on technique for sit <> stand, standing balance, WB for ataxia/coordination and gait/stair training.    Consulted and Agree with Plan of Care Patient           Patient will benefit from skilled therapeutic intervention in order to improve the following deficits and impairments:  Abnormal gait, Decreased activity tolerance, Decreased balance, Decreased coordination, Difficulty walking, Dizziness, Pain, Decreased mobility, Decreased range of motion  Visit Diagnosis: Other symptoms and signs involving the nervous system  Other abnormalities of gait and mobility  Muscle weakness (generalized)  Unsteadiness on feet  Dizziness and giddiness  Ataxic gait  Cervicalgia  Repeated falls     Problem List Patient Active Problem List   Diagnosis Date Noted  . Vitamin D deficiency 01/26/2020  . B12 deficiency 01/25/2020  . Anxiety and depression 01/19/2020  . Alcohol use 01/19/2020  . Concussion with loss of consciousness 01/03/2020  . Thyroid nodule 01/03/2020  . Irregular menses 04/28/2019  . Neuropathy 04/28/2019  . Infertility counseling 10/01/2017  . DDD (degenerative disc disease), cervical 11/29/2016  . Hypoglycemia 11/29/2016  . Kidney stone 11/29/2016  . Sinusitis 11/29/2016   . Insomnia 03/08/2016  . Neck pain 03/08/2016  . 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 [place], [place], [address] Phone: [phone]   Fax:  [phone]  Name: Jane Hendricks MRN: [id number] Date of Birth: 01/15/1981

## 2020-04-29 ENCOUNTER — Encounter: Payer: Self-pay | Admitting: Physician Assistant

## 2020-04-29 NOTE — Addendum Note (Signed)
Addended by: Dierdre Highman on: 04/29/2020 02:22 PM   Modules accepted: Orders

## 2020-05-02 ENCOUNTER — Telehealth: Payer: Self-pay | Admitting: Family Medicine

## 2020-05-02 ENCOUNTER — Ambulatory Visit: Payer: 59 | Admitting: Physical Therapy

## 2020-05-02 DIAGNOSIS — R29898 Other symptoms and signs involving the musculoskeletal system: Secondary | ICD-10-CM

## 2020-05-02 DIAGNOSIS — R29818 Other symptoms and signs involving the nervous system: Secondary | ICD-10-CM

## 2020-05-02 DIAGNOSIS — R269 Unspecified abnormalities of gait and mobility: Secondary | ICD-10-CM

## 2020-05-02 NOTE — Telephone Encounter (Signed)
Pt called, she was advised to report on her condition this week. She has had NO improvement, but does not feel worse. Unsure if Dr. Denyse Amass wanted to move forward with MRI or if patient was to wait for Neuro appt.

## 2020-05-03 ENCOUNTER — Ambulatory Visit: Payer: 59 | Admitting: Physical Therapy

## 2020-05-04 NOTE — Telephone Encounter (Signed)
If not worsening okay to wait till appointment with neurology.  If worsening or having lots of falls I will order MRIs.  Let me know how you are feeling.

## 2020-05-04 NOTE — Telephone Encounter (Signed)
Returned pt's call and she states that she isn't worse but is no better.  She had 2 near falls yesterday and another this morning.  She states that she can't climb the 3 steps at the front of her house currently.  She would like to proceed w/ MRIs if you agree.  Please advise.

## 2020-05-05 ENCOUNTER — Other Ambulatory Visit: Payer: Self-pay

## 2020-05-05 ENCOUNTER — Encounter: Payer: Self-pay | Admitting: Physical Therapy

## 2020-05-05 ENCOUNTER — Ambulatory Visit: Payer: 59 | Attending: Family Medicine | Admitting: Physical Therapy

## 2020-05-05 DIAGNOSIS — R296 Repeated falls: Secondary | ICD-10-CM | POA: Diagnosis present

## 2020-05-05 DIAGNOSIS — R29818 Other symptoms and signs involving the nervous system: Secondary | ICD-10-CM | POA: Diagnosis not present

## 2020-05-05 DIAGNOSIS — R2689 Other abnormalities of gait and mobility: Secondary | ICD-10-CM | POA: Insufficient documentation

## 2020-05-05 DIAGNOSIS — R42 Dizziness and giddiness: Secondary | ICD-10-CM | POA: Diagnosis present

## 2020-05-05 DIAGNOSIS — M6281 Muscle weakness (generalized): Secondary | ICD-10-CM | POA: Diagnosis present

## 2020-05-05 DIAGNOSIS — R41841 Cognitive communication deficit: Secondary | ICD-10-CM | POA: Insufficient documentation

## 2020-05-05 DIAGNOSIS — M542 Cervicalgia: Secondary | ICD-10-CM | POA: Insufficient documentation

## 2020-05-05 DIAGNOSIS — R260 Ataxic gait: Secondary | ICD-10-CM | POA: Insufficient documentation

## 2020-05-05 DIAGNOSIS — R2681 Unsteadiness on feet: Secondary | ICD-10-CM | POA: Insufficient documentation

## 2020-05-05 NOTE — Therapy (Signed)
[facility] [address] [place], [place], [address] Phone: [phone]   Fax:  [phone]  Physical Therapy Treatment  Patient Details  Name: Jane Hendricks MRN: [id number] Date of Birth: 08-02-1981 Referring Provider (PT): Clementeen Graham, MD   Encounter Date: 05/05/2020   PT End of Session - 05/05/20 1413    Visit Number 32    Number of Visits 55    Date for PT Re-Evaluation 07/27/20    Authorization Type [employer] - $40 copay - VL: 60 (PT/OT/ST combined)    Authorization - Visit Number 32    Authorization - Number of Visits 60    PT Start Time 1320    PT Stop Time 1405    PT Time Calculation (min) 45 min    Activity Tolerance Patient tolerated treatment well    Behavior During Therapy WFL for tasks assessed/performed           Past Medical History:  Diagnosis Date  . Ataxic gait   . Compression fracture of C-spine (HCC) 12/12/2019   C4-C5  . GERD (gastroesophageal reflux disease)   . Herniated disc, cervical   . Hypoglycemia   . Migraines     Past Surgical History:  Procedure Laterality Date  . ANKLE SURGERY    . DILATION AND CURETTAGE OF UTERUS    . WISDOM TOOTH EXTRACTION      There were no vitals filed for this visit.   Subjective Assessment - 05/05/20 1328    Subjective Neurologypsychology has been ordered and scheduled for November.  Has MRI ordered but not scheduled yet.  Has fallen 4 times since last session at home and at school.  No dizziness.  Still using RW.  Feels like she is having more memory issues.    Pertinent History PMH: hx of herniated disc in cervical spine    Diagnostic tests MRI 2/13 cervical spine: No marrow edema to suggest acute cervical spine fracture. Cervical spondylosis as outlined. No more than mild spinal canalstenosis at any level. Multilevel neural foraminal narrowinggreatest on the left at C4-C5 and bilaterally at C5-C6(moderate/advanced at these sites).MRI brain: Normal  MRI appearance of the brain for age. No evidence of acuteintracranial abnormality    Patient Stated Goals be able to walk and drive again to be more independent; to play golf and cornhole again    Currently in Pain? Yes                   Vestibular Assessment - 05/05/20 1334      Oculomotor Exam-Fixation Suppressed    Left Head Impulse negative    Right Head Impulse positive      Balancemaster   Balancemaster Comment M-CTSIB: condition 1: 30 seconds with moderate sway; Condition 2: 15 seconds with moderate sway; Condition 3: 30 seconds mild sway; Condition 4: 8 seconds significant sway      Positional Sensitivities   Nose to Right Knee Lightheadedness    Right Knee to Sitting Lightedness    Nose to Left Knee Lightheadedness    Left Knee to Sitting Lightheadedness    Head Turning x 5 Moderate dizziness    Head Nodding x 5 Moderate dizziness           Removed most standing exercises and added the exercises in bold below.  Pt return demonstrated safely with verbal cues for technique.   Access Code: [email or web address] URL: [email or web address] Date: 05/05/2020 Prepared by: Bufford Lope  Exercises Seated Cervical Rotation AROM - 1 x  daily - 7 x weekly - 4-5 reps - 4 sets Seated Cervical Sidebending AROM - 1 x daily - 7 x weekly - 4-5 reps - 4 sets Sit to Stand with Arm Reach Toward Target - 2 x daily - 7 x weekly - 2 sets - 5 reps - JUST COME TO SQUAT POSITION, DO NOT STAND ALL THE WAY UP Seated Gaze Stabilization with Head Rotation - 2 x daily - 7 x weekly - 2 sets - 30 seconds hold Seated Gaze Stabilization with Head Nod - 2 x daily - 7 x weekly - 2 sets - 30 seconds hold Staggered Stance Forward Backward Weight Shift with Counter Support - 1 x daily - 7 x weekly - 2 sets - 10 reps    PT Education - 05/05/20 1413    Education Details revised and downgraded HEP    Person(s) Educated Patient    Methods Explanation;Demonstration;Handout    Comprehension  Verbalized understanding;Returned demonstration            PT Short Term Goals - 04/29/20 1350      PT SHORT TERM GOAL #1   Title Pt will demonstrate ability to safely perform land and aquatic HEP with husband's supervision    Time 6    Period Weeks    Status Revised    Target Date 06/13/20      PT SHORT TERM GOAL #2   Title Pt will demonstrate 4 point improvement in FGA with LRAD to indicate decreased falls risk    Baseline 7/30    Time 6    Period Weeks    Status Revised    Target Date 06/13/20      PT SHORT TERM GOAL #3   Title Pt will demonstrate ability to enter/exit pool with ladder with supervision to be able to enter/exit pool at home    Time 6    Period Weeks    Status Revised    Target Date 06/13/20      PT SHORT TERM GOAL #4   Title Pt will improve gait speed with LRAD to >/= 1.4 ft/sec    Baseline .97 ft/sec with RW and Min A    Time 6    Period Weeks    Status Revised    Target Date 06/13/20      PT SHORT TERM GOAL #5   Title Pt will improve cervical extension to >/= 30 deg and bilat rotation to 60 deg to improve mobility for driving    Baseline 15 deg extension; 55 R rotation, 40 L rotation    Time 6    Period Weeks    Status Revised    Target Date 06/13/20      PT SHORT TERM GOAL #6   Title Pt will demonstrate ability to perform sit to stand and stand pivot transfers with LRAD MOD I    Baseline Min A    Time 6    Period Weeks    Status New    Target Date 06/13/20             PT Long Term Goals - 05/05/20 1421      PT LONG TERM GOAL #1   Title Pt will be independent with final HEP in order to build upon functional gains made in therapy.  (ALL GOALS DUE 07/28/20)    Time 12    Period Weeks    Status Revised      PT LONG TERM GOAL #2   Title  Pt will ambulate 1000' outside over paved and grassy surfaces without AD, independently to be able to return to golfing    Time 12    Period Weeks    Status Revised      PT LONG TERM GOAL #3    Title Pt will improve FGA score to at least a 20/30 to indicate decreased falls risk in the community    Time 12    Period Weeks    Status Revised      PT LONG TERM GOAL #4   Title Pt will improve B cervical rotation AROM to 65 degrees for functional ROM for driving.    Time 12    Period Weeks    Status Revised      PT LONG TERM GOAL #5   Title Pt will demonstrate ability to perform sit <> stand and stand pivot without AD independently    Time 12    Period Weeks    Status New      PT LONG TERM GOAL #6   Title Pt will improve gait speed without AD to 2.35ft/sec in order to decr fall risk and improve community mobility.    Time 12    Period Weeks    Status Revised      PT LONG TERM GOAL #7   Title Pt will demonstrate decreased motion sensitivity as indicated by ability to perform bending down to the floor, performing head turns/nods and body turns without any dizziness; pt will also improve use of vestibular system as indicated by ability to maintain 30 seconds on each condition of the MCTSIB.    Baseline see Vestibular tab    Time 12    Period Weeks    Status On-going      PT LONG TERM GOAL #8   Title Pt will demonstrate ability to stand without support x 10 minutes to play corn hole and demonstrate ability to hit whiffle golf balls in grass x 10 reps with supervision    Time 12    Period Weeks    Status Revised                 Plan - 05/05/20 1414    Clinical Impression Statement Performed re-assessment of vestibular system with pt continuing to demonstrate impaired VOR and motion sensitivity.  Revised HEP based on current functional level with pt return demonstrating each exercise safely with supervision.  Aquatic therapist to contact pt about setting up more aquatic therapy sessions.    Personal Factors and Comorbidities Past/Current Experience;Profession    Examination-Activity Limitations Bend;Carry;Dressing;Locomotion  Level;Squat;Stairs;Lift;Stand;Transfers;Hygiene/Grooming;Bathing    Examination-Participation Restrictions Cleaning;Community Activity;Driving;Shop;Laundry    Rehab Potential Good    PT Frequency 2x / week    PT Duration 12 weeks    PT Treatment/Interventions ADLs/Self Care Home Management;Canalith Repostioning;DME Instruction;Gait training;Stair training;Functional mobility training;Neuromuscular re-education;Balance training;Therapeutic exercise;Therapeutic activities;Patient/family education;Dry needling;Passive range of motion;Vestibular;Aquatic Therapy;Moist Heat;Cryotherapy;Manual techniques    PT Next Visit Plan For aquatic - work on getting in and out of pool with ladder for home pool.  Pt has experienced a significant decline in function - has returned to using RW and min A due to ataxic movements.  Goals downgraded.  In land and aquatic continue to work on technique for sit <> stand, standing balance, WB for ataxia/coordination and gait/stair training.    Consulted and Agree with Plan of Care Patient           Patient will benefit from skilled therapeutic intervention in order to  improve the following deficits and impairments:  Abnormal gait, Decreased activity tolerance, Decreased balance, Decreased coordination, Difficulty walking, Dizziness, Pain, Decreased mobility, Decreased range of motion  Visit Diagnosis: Other symptoms and signs involving the nervous system  Other abnormalities of gait and mobility  Muscle weakness (generalized)  Unsteadiness on feet  Dizziness and giddiness  Ataxic gait  Cervicalgia  Repeated falls     Problem List Patient Active Problem List   Diagnosis Date Noted  . Vitamin D deficiency 01/26/2020  . B12 deficiency 01/25/2020  . Anxiety and depression 01/19/2020  . Alcohol use 01/19/2020  . Concussion with loss of consciousness 01/03/2020  . Thyroid nodule 01/03/2020  . Irregular menses 04/28/2019  . Neuropathy 04/28/2019  .  Infertility counseling 10/01/2017  . DDD (degenerative disc disease), cervical 11/29/2016  . Hypoglycemia 11/29/2016  . Kidney stone 11/29/2016  . Sinusitis 11/29/2016  . Insomnia 03/08/2016  . Neck pain 03/08/2016  . 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 [place], [place], [address] Phone: [phone]   Fax:  [phone]  Name: Kampbell Holaway MRN: [id number] Date of Birth: 08-24-1981

## 2020-05-05 NOTE — Telephone Encounter (Signed)
MRI is ordered to [facility].

## 2020-05-05 NOTE — Patient Instructions (Addendum)
Access Code: [email or web address] URL: [email or web address] Date: 05/05/2020 Prepared by: Bufford Lope  Exercises Seated Cervical Rotation AROM - 1 x daily - 7 x weekly - 4-5 reps - 4 sets Seated Cervical Sidebending AROM - 1 x daily - 7 x weekly - 4-5 reps - 4 sets Sit to Stand with Arm Reach Toward Target - 2 x daily - 7 x weekly - 2 sets - 5 reps Seated Gaze Stabilization with Head Rotation - 2 x daily - 7 x weekly - 2 sets - 30 seconds hold Seated Gaze Stabilization with Head Nod - 2 x daily - 7 x weekly - 2 sets - 30 seconds hold Staggered Stance Forward Backward Weight Shift with Counter Support - 1 x daily - 7 x weekly - 2 sets - 10 reps

## 2020-05-05 NOTE — Telephone Encounter (Signed)
Called pt and let her know that MRIs have been ordered for her brain, c-spine and T-spine and that she should expect a call from [facility] to get those scheduled.

## 2020-05-05 NOTE — Addendum Note (Signed)
Addended by: Rodolph Bong on: 05/05/2020 07:17 AM   Modules accepted: Orders

## 2020-05-09 ENCOUNTER — Ambulatory Visit (INDEPENDENT_AMBULATORY_CARE_PROVIDER_SITE_OTHER): Payer: 59 | Admitting: Psychology

## 2020-05-09 DIAGNOSIS — F4323 Adjustment disorder with mixed anxiety and depressed mood: Secondary | ICD-10-CM | POA: Diagnosis not present

## 2020-05-10 ENCOUNTER — Ambulatory Visit: Payer: 59 | Admitting: Speech Pathology

## 2020-05-10 ENCOUNTER — Ambulatory Visit: Payer: 59 | Admitting: Physical Therapy

## 2020-05-10 ENCOUNTER — Other Ambulatory Visit: Payer: Self-pay

## 2020-05-10 ENCOUNTER — Encounter: Payer: Self-pay | Admitting: Physical Therapy

## 2020-05-10 DIAGNOSIS — M6281 Muscle weakness (generalized): Secondary | ICD-10-CM

## 2020-05-10 DIAGNOSIS — R2689 Other abnormalities of gait and mobility: Secondary | ICD-10-CM

## 2020-05-10 DIAGNOSIS — R296 Repeated falls: Secondary | ICD-10-CM

## 2020-05-10 DIAGNOSIS — R29818 Other symptoms and signs involving the nervous system: Secondary | ICD-10-CM | POA: Diagnosis not present

## 2020-05-10 DIAGNOSIS — R2681 Unsteadiness on feet: Secondary | ICD-10-CM

## 2020-05-10 DIAGNOSIS — R41841 Cognitive communication deficit: Secondary | ICD-10-CM

## 2020-05-10 DIAGNOSIS — R260 Ataxic gait: Secondary | ICD-10-CM

## 2020-05-10 NOTE — Therapy (Signed)
[facility] [address] [place], [place], [address] Phone: [phone]   Fax:  [phone]  Physical Therapy Treatment  Patient Details  Name: Jane Hendricks MRN: [id number] Date of Birth: 22-Nov-1980 Referring Provider (PT): Clementeen Graham, MD   Encounter Date: 05/10/2020   PT End of Session - 05/10/20 1549    Visit Number 33    Number of Visits 55    Date for PT Re-Evaluation 07/27/20    Authorization Type [employer]et[employer] - $40 copay - VL: 60 (PT/OT/ST combined)    Authorization - Visit Number 33    Authorization - Number of Visits 60    PT Start Time 1155    PT Stop Time 1235    PT Time Calculation (min) 40 min    Activity Tolerance Patient tolerated treatment well    Behavior During Therapy WFL for tasks assessed/performed           Past Medical History:  Diagnosis Date  . Ataxic gait   . Compression fracture of C-spine (HCC) 12/12/2019   C4-C5  . GERD (gastroesophageal reflux disease)   . Herniated disc, cervical   . Hypoglycemia   . Migraines     Past Surgical History:  Procedure Laterality Date  . ANKLE SURGERY    . DILATION AND CURETTAGE OF UTERUS    . WISDOM TOOTH EXTRACTION      There were no vitals filed for this visit.   Subjective Assessment - 05/10/20 1156    Subjective Not using RW today; gait is less ataxic but pt still touching walls for stability.  Didn't do anything different; it progressed as quickly and suddenly as it regressed.  Worked on exercises over the weekend and they were hard at first but are now less challenging.  Worked long hours this weekend.  MRI has not been scheduled yet.    Pertinent History PMH: hx of herniated disc in cervical spine    Diagnostic tests MRI 2/13 cervical spine: No marrow edema to suggest acute cervical spine fracture. Cervical spondylosis as outlined. No more than mild spinal canalstenosis at any level. Multilevel neural foraminal narrowinggreatest on the left  at C4-C5 and bilaterally at C5-C6(moderate/advanced at these sites).MRI brain: Normal MRI appearance of the brain for age. No evidence of acuteintracranial abnormality    Patient Stated Goals be able to walk and drive again to be more independent; to play golf and cornhole again    Currently in Pain? No/denies                             [facility] Adult PT Treatment/Exercise - 05/10/20 1553      Transfers   Transfers Sit to Stand;Stand to Sit    Sit to Stand 5: Supervision    Sit to Stand Details (indicate cue type and reason) did not need therapist to guard L foot from slipping and pt able to transition in one smooth movement    Stand to Sit 5: Supervision      Ambulation/Gait   Ambulation/Gait Yes    Ambulation/Gait Assistance 4: Min guard    Ambulation/Gait Assistance Details not using RW today; still ambulating with wide BOS, short step length and decreased foot clearance; very guarded    Ambulation Distance (Feet) 100 Feet    Assistive device None    Gait Pattern Step-through pattern;Decreased step length - right;Decreased step length - left;Decreased stride length;Wide base of support;Poor foot clearance - left;Poor  foot clearance - right    Ambulation Surface Level;Indoor               Balance Exercises - 05/10/20 1209      Balance Exercises: Standing   Standing Eyes Opened Narrow base of support (BOS);Wide (BOA);Head turns;Solid surface;Other reps (comment)   2 sets x 10 reps head nods/turn   Wall Bumps Hip;Eyes opened;Eyes closed;10 reps   ankle and hip strategy x 10 reps each, EO, EC   Other Standing Exercises mild dizziness with wall bumps for ankle and hip strategy with eyes closed; resolved quickly    Other Standing Exercises Comments With resistance around pelvis (green theraband) performed alternating stepping forwards and back in place x 10 reps with UE support focusing on weight shifting forwards to front foot against resistance and shifting back  with control.  Performed 10 without UE support.               PT Short Term Goals - 04/29/20 1350      PT SHORT TERM GOAL #1   Title Pt will demonstrate ability to safely perform land and aquatic HEP with husband's supervision    Time 6    Period Weeks    Status Revised    Target Date 06/13/20      PT SHORT TERM GOAL #2   Title Pt will demonstrate 4 point improvement in FGA with LRAD to indicate decreased falls risk    Baseline 7/30    Time 6    Period Weeks    Status Revised    Target Date 06/13/20      PT SHORT TERM GOAL #3   Title Pt will demonstrate ability to enter/exit pool with ladder with supervision to be able to enter/exit pool at home    Time 6    Period Weeks    Status Revised    Target Date 06/13/20      PT SHORT TERM GOAL #4   Title Pt will improve gait speed with LRAD to >/= 1.4 ft/sec    Baseline .97 ft/sec with RW and Min A    Time 6    Period Weeks    Status Revised    Target Date 06/13/20      PT SHORT TERM GOAL #5   Title Pt will improve cervical extension to >/= 30 deg and bilat rotation to 60 deg to improve mobility for driving    Baseline 15 deg extension; 55 R rotation, 40 L rotation    Time 6    Period Weeks    Status Revised    Target Date 06/13/20      PT SHORT TERM GOAL #6   Title Pt will demonstrate ability to perform sit to stand and stand pivot transfers with LRAD MOD I    Baseline Min A    Time 6    Period Weeks    Status New    Target Date 06/13/20             PT Long Term Goals - 05/05/20 1421      PT LONG TERM GOAL #1   Title Pt will be independent with final HEP in order to build upon functional gains made in therapy.  (ALL GOALS DUE 07/28/20)    Time 12    Period Weeks    Status Revised      PT LONG TERM GOAL #2   Title Pt will ambulate 1000' outside over paved and grassy surfaces without AD,  independently to be able to return to golfing    Time 12    Period Weeks    Status Revised      PT LONG TERM GOAL  #3   Title Pt will improve FGA score to at least a 20/30 to indicate decreased falls risk in the community    Time 12    Period Weeks    Status Revised      PT LONG TERM GOAL #4   Title Pt will improve B cervical rotation AROM to 65 degrees for functional ROM for driving.    Time 12    Period Weeks    Status Revised      PT LONG TERM GOAL #5   Title Pt will demonstrate ability to perform sit <> stand and stand pivot without AD independently    Time 12    Period Weeks    Status New      PT LONG TERM GOAL #6   Title Pt will improve gait speed without AD to 2.236ft/sec in order to decr fall risk and improve community mobility.    Time 12    Period Weeks    Status Revised      PT LONG TERM GOAL #7   Title Pt will demonstrate decreased motion sensitivity as indicated by ability to perform bending down to the floor, performing head turns/nods and body turns without any dizziness; pt will also improve use of vestibular system as indicated by ability to maintain 30 seconds on each condition of the MCTSIB.    Baseline see Vestibular tab    Time 12    Period Weeks    Status On-going      PT LONG TERM GOAL #8   Title Pt will demonstrate ability to stand without support x 10 minutes to play corn hole and demonstrate ability to hit whiffle golf balls in grass x 10 reps with supervision    Time 12    Period Weeks    Status Revised                 Plan - 05/10/20 1550    Clinical Impression Statement Patient demonstrating significant improvement in ataxia and balance today and only required intermittent min A when ambulating through gym without RW.  Continued to focus on postural control, limits of stability and ankle/hip strategy training with and without vision and with/without UE support.  Pt fatigued by end of session but was able to ambulate down to speech therapy office.    Personal Factors and Comorbidities Past/Current Experience;Profession    Examination-Activity Limitations  Bend;Carry;Dressing;Locomotion Level;Squat;Stairs;Lift;Stand;Transfers;Hygiene/Grooming;Bathing    Examination-Participation Restrictions Cleaning;Community Activity;Driving;Shop;Laundry    Rehab Potential Good    PT Frequency 2x / week    PT Duration 12 weeks    PT Treatment/Interventions ADLs/Self Care Home Management;Canalith Repostioning;DME Instruction;Gait training;Stair training;Functional mobility training;Neuromuscular re-education;Balance training;Therapeutic exercise;Therapeutic activities;Patient/family education;Dry needling;Passive range of motion;Vestibular;Aquatic Therapy;Moist Heat;Cryotherapy;Manual techniques    PT Next Visit Plan For aquatic - work on getting in and out of pool with ladder for home pool.  In land and aquatic continue to work on technique for sit <> stand, standing balance, WB for ataxia/coordination and gait/stair training; walking against resistance of sports cord or theraband in various directions.  Ankle and hip strategy training.    Consulted and Agree with Plan of Care Patient           Patient will benefit from skilled therapeutic intervention in order to improve the following deficits and  impairments:  Abnormal gait, Decreased activity tolerance, Decreased balance, Decreased coordination, Difficulty walking, Dizziness, Pain, Decreased mobility, Decreased range of motion  Visit Diagnosis: Other symptoms and signs involving the nervous system  Other abnormalities of gait and mobility  Muscle weakness (generalized)  Unsteadiness on feet  Ataxic gait  Repeated falls     Problem List Patient Active Problem List   Diagnosis Date Noted  . Vitamin D deficiency 01/26/2020  . B12 deficiency 01/25/2020  . Anxiety and depression 01/19/2020  . Alcohol use 01/19/2020  . Concussion with loss of consciousness 01/03/2020  . Thyroid nodule 01/03/2020  . Irregular menses 04/28/2019  . Neuropathy 04/28/2019  . Infertility counseling 10/01/2017  . DDD  (degenerative disc disease), cervical 11/29/2016  . Hypoglycemia 11/29/2016  . Kidney stone 11/29/2016  . Sinusitis 11/29/2016  . Insomnia 03/08/2016  . Neck pain 03/08/2016  . 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

## 2020-05-11 ENCOUNTER — Encounter: Payer: Self-pay | Admitting: Speech Pathology

## 2020-05-11 NOTE — Progress Notes (Deleted)
I acted as a Neurosurgeon for [employer], PA-C Corky Mull, LPN   Subjective:    Jane Hendricks is a 39 y.o. female and is here for a comprehensive physical exam.  HPI  Health Maintenance Due  Topic Date Due   Hepatitis C Screening  Never done   COVID-19 Vaccine (1) Never done    Acute Concerns:   Chronic Issues:   Health Maintenance: Immunizations -- UTD Colonoscopy -- N/A Mammogram -- N/A PAP -- due Bone Density -- N/A Diet -- *** Caffeine intake -- *** Sleep habits -- *** Exercise -- *** Current Weight --    Weight History: Wt Readings from Last 10 Encounters:  04/27/20 138 lb (62.6 kg)  04/19/20 138 lb 8 oz (62.8 kg)  04/07/20 135 lb 12.8 oz (61.6 kg)  03/13/20 134 lb 9.6 oz (61.1 kg)  03/06/20 130 lb (59 kg)  02/08/20 131 lb 6.4 oz (59.6 kg)  01/26/20 128 lb (58.1 kg)  01/25/20 129 lb 9.6 oz (58.8 kg)  01/19/20 133 lb (60.3 kg)  01/18/20 134 lb (60.8 kg)   There is no height or weight on file to calculate BMI. Mood -- *** No LMP recorded. Period characteristics -- *** Birth control -- ***  Depression screen PHQ 2/9 04/19/2020  Decreased Interest 1  Down, Depressed, Hopeless 1  PHQ - 2 Score 2  Altered sleeping 2  Tired, decreased energy 1  Change in appetite 0  Feeling bad or failure about yourself  2  Trouble concentrating 0  Moving slowly or fidgety/restless 0  Suicidal thoughts 0  PHQ-9 Score 7  Difficult doing work/chores Somewhat difficult     Other providers/specialists: Patient Care Team: Jarold Motto, [place] as PCP - General (Physician Assistant) Lynnea Ferrier., MD as Referring Physician (Obstetrics and Gynecology)   PMHx, SurgHx, SocialHx, Medications, and Allergies were reviewed in the Visit Navigator and updated as appropriate.   Past Medical History:  Diagnosis Date   Ataxic gait    Compression fracture of C-spine (HCC) 12/12/2019   C4-C5   GERD (gastroesophageal reflux disease)    Herniated disc,  cervical    Hypoglycemia    Migraines     Past Surgical History:  Procedure Laterality Date   ANKLE SURGERY     DILATION AND CURETTAGE OF UTERUS     WISDOM TOOTH EXTRACTION      Family History  Problem Relation Age of Onset   Osteoarthritis Mother    Hyperlipidemia Mother    Hypertension Mother    Kidney disease Father    Osteoarthritis Maternal Grandmother    COPD Maternal Grandmother    Osteoarthritis Maternal Grandfather    Lung cancer Maternal Grandfather    Diabetes Maternal Grandfather    Hyperlipidemia Maternal Grandfather    Hypertension Maternal Grandfather    Osteoarthritis Paternal Grandmother    COPD Paternal Grandmother    Osteoarthritis Paternal Grandfather    Colon cancer Paternal Grandfather        after age 42   Lung cancer Maternal Aunt        mets to brain   Breast cancer Neg Hx     Social History   Tobacco Use   Smoking status: Never Smoker   Smokeless tobacco: Never Used  Vaping Use   Vaping Use: Never used  Substance Use Topics   Alcohol use: Not Currently    Alcohol/week: 16.0 standard drinks    Types: 16 Shots of liquor per week    Comment: typically 4  shots 4days per week, Pt has stopped 12/2019   Drug use: No    Review of Systems:   ROS  Objective:   There were no vitals taken for this visit.  General Appearance:    Alert, cooperative, no distress, appears stated age  Head:    Normocephalic, without obvious abnormality, atraumatic  Eyes:    PERRL, conjunctiva/corneas clear, EOM's intact, fundi    benign, both eyes  Ears:    Normal TM's and external ear canals, both ears  Nose:   Nares normal, septum midline, mucosa normal, no drainage    or sinus tenderness  Throat:   Lips, mucosa, and tongue normal; teeth and gums normal  Neck:   Supple, symmetrical, trachea midline, no adenopathy;    thyroid:  no enlargement/tenderness/nodules; no carotid   bruit or JVD  Back:     Symmetric, no curvature, ROM  normal, no CVA tenderness  Lungs:     Clear to auscultation bilaterally, respirations unlabored  Chest Wall:    No tenderness or deformity   Heart:    Regular rate and rhythm, S1 and S2 normal, no murmur, rub   or gallop  Breast Exam:    No tenderness, masses, or nipple abnormality  Abdomen:     Soft, non-tender, bowel sounds active all four quadrants,    no masses, no organomegaly  Genitalia:    Normal female without lesion, discharge or tenderness  Rectal:    Normal tone no masses or tenderness  Extremities:   Extremities normal, atraumatic, no cyanosis or edema  Pulses:   2+ and symmetric all extremities  Skin:   Skin color, texture, turgor normal, no rashes or lesions  Lymph nodes:   Cervical, supraclavicular, and axillary nodes normal  Neurologic:   CNII-XII intact, normal strength, sensation and reflexes    throughout   Results for orders placed or performed in visit on 04/19/20  Vitamin B12  Result Value Ref Range   Vitamin B-12 510 211 - 911 pg/mL  HIV Antibody (routine testing w rflx)  Result Value Ref Range   HIV 1&2 Ab, 4th Generation NON-REACTIVE NON-REACTI  RPR  Result Value Ref Range   RPR Ser Ql NON-REACTIVE NON-REACTI  Urine cytology ancillary only([place])  Result Value Ref Range   Neisseria Gonorrhea Negative    Chlamydia Negative    Trichomonas Negative    Comment Normal Reference Range Trichomonas - Negative    Comment Normal Reference [place] Chlamydia - Negative    Comment      Normal Reference Range Neisseria Gonorrhea - Negative    Assessment/Plan:   There are no diagnoses linked to this encounter.  Well Adult Exam: Labs ordered: {Yes/No:18319::"Yes"}. Patient counseling was done. See below for items discussed. Discussed the patient's BMI. The {BMI plan (MU NQF measure 421):19504} Follow up {follow-up interval:13814}.  Patient Counseling:   [x]     Nutrition: Stressed importance of moderation in sodium/caffeine intake, saturated fat and  cholesterol, caloric balance, sufficient intake of fresh fruits, vegetables, fiber, calcium, iron, and 1 mg of folate supplement per day (for females capable of pregnancy).   [x]      Stressed the importance of regular exercise.    [x]     Substance Abuse: Discussed cessation/primary prevention of tobacco, alcohol, or other drug use; driving or other dangerous activities under the influence; availability of treatment for abuse.    [x]      Injury prevention: Discussed safety belts, safety helmets, smoke detector, smoking near bedding or upholstery.    [  x]     Sexuality: Discussed sexually transmitted diseases, partner selection, use of condoms, avoidance of unintended pregnancy  and contraceptive alternatives.    [x]     Dental health: Discussed importance of regular tooth brushing, flossing, and dental visits.   [x]      Health maintenance and immunizations reviewed. Please refer to Health maintenance section.   ***  , PA-C [place] [facility]
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
## 2020-05-12 ENCOUNTER — Encounter: Payer: 59 | Admitting: Physician Assistant

## 2020-05-12 DIAGNOSIS — Z0289 Encounter for other administrative examinations: Secondary | ICD-10-CM

## 2020-05-14 ENCOUNTER — Other Ambulatory Visit: Payer: Self-pay

## 2020-05-14 ENCOUNTER — Ambulatory Visit (INDEPENDENT_AMBULATORY_CARE_PROVIDER_SITE_OTHER): Payer: 59

## 2020-05-14 DIAGNOSIS — R269 Unspecified abnormalities of gait and mobility: Secondary | ICD-10-CM | POA: Diagnosis not present

## 2020-05-14 DIAGNOSIS — R29818 Other symptoms and signs involving the nervous system: Secondary | ICD-10-CM

## 2020-05-14 DIAGNOSIS — R29898 Other symptoms and signs involving the musculoskeletal system: Secondary | ICD-10-CM
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
## 2020-05-16 ENCOUNTER — Ambulatory Visit: Payer: 59 | Admitting: Physical Therapy

## 2020-05-16 ENCOUNTER — Other Ambulatory Visit: Payer: Self-pay

## 2020-05-16 DIAGNOSIS — R29818 Other symptoms and signs involving the nervous system: Secondary | ICD-10-CM | POA: Diagnosis not present

## 2020-05-16 DIAGNOSIS — M6281 Muscle weakness (generalized): Secondary | ICD-10-CM

## 2020-05-16 DIAGNOSIS — R2689 Other abnormalities of gait and mobility: Secondary | ICD-10-CM

## 2020-05-16 DIAGNOSIS — R2681 Unsteadiness on feet: Secondary | ICD-10-CM

## 2020-05-16 DIAGNOSIS — R296 Repeated falls: Secondary | ICD-10-CM

## 2020-05-16 NOTE — Therapy (Signed)
[facility] [address] [place], [place], [address] Phone: [phone]   Fax:  [phone]  Physical Therapy Treatment  Patient Details  Name: Jane Hendricks MRN: [id number] Date of Birth: 1981-07-19 Referring Provider (PT): Clementeen Graham, MD   Encounter Date: 05/16/2020   PT End of Session - 05/16/20 1706    Visit Number 34    Number of Visits 55    Date for PT Re-Evaluation 07/27/20    Authorization Type [employer] - $40 copay - VL: 60 (PT/OT/ST combined)    Authorization - Visit Number 34    Authorization - Number of Visits 60    PT Start Time 1447    PT Stop Time 1530    PT Time Calculation (min) 43 min    Equipment Utilized During Treatment Gait belt    Activity Tolerance Patient tolerated treatment well    Behavior During Therapy WFL for tasks assessed/performed           Past Medical History:  Diagnosis Date  . Ataxic gait   . Compression fracture of C-spine (HCC) 12/12/2019   C4-C5  . GERD (gastroesophageal reflux disease)   . Herniated disc, cervical   . Hypoglycemia   . Migraines     Past Surgical History:  Procedure Laterality Date  . ANKLE SURGERY    . DILATION AND CURETTAGE OF UTERUS    . WISDOM TOOTH EXTRACTION      There were no vitals filed for this visit.   Subjective Assessment - 05/16/20 1449    Subjective Has not been using the RW recently. Thinks she might have overdone it over the weekend, went up and down the stairs 3 times. Walked out to her car with a coworker the other day and felt more balanced - states it was because someone was right there next to her. Starts back aquatic therapy tomorrow. Got injections at the base of her skull, was sore, little bit less pain. Notices that her memory has gotten worse. Gets really dizzy when bending down to get dressed or put clothes on    Pertinent History PMH: hx of herniated disc in cervical spine    Diagnostic tests MRI 2/13 cervical spine:  No marrow edema to suggest acute cervical spine fracture. Cervical spondylosis as outlined. No more than mild spinal canalstenosis at any level. Multilevel neural foraminal narrowinggreatest on the left at C4-C5 and bilaterally at C5-C6(moderate/advanced at these sites).MRI brain: Normal MRI appearance of the brain for age. No evidence of acuteintracranial abnormality    Patient Stated Goals be able to walk and drive again to be more independent; to play golf and cornhole again    Currently in Pain? No/denies                             [facility] Adult PT Treatment/Exercise - 05/16/20 0001      Transfers   Transfers Sit to Stand;Stand to Sit    Sit to Stand 5: Supervision;With upper extremity assist;From bed    Sit to Stand Details (indicate cue type and reason) needing BUE support to perform from mat table, cues for proper forward weight shift as pt performed 1st rep with incr weight shift towards L and incr rotation to come up to stand, pt able to demo x10 reps with proper technique once cued. attempted to perform without UE assist, however pt unable to stand    Stand to Sit 5: Supervision;With upper  extremity assist;To bed      Ambulation/Gait   Ambulation/Gait Yes    Ambulation/Gait Assistance 4: Min guard    Ambulation/Gait Assistance Details ambulating without RW, pt initially ambulating into clinic with striking B toes down first vs. heel strike, then with incr gait distances, pt demonstrating improved heel strike. Pt continues to remain very guarded with no B arm swing during gait and smaller step length. Pt with a couple episodes of incr narrow BOS and scissoring     Ambulation Distance (Feet) 230 Feet    Assistive device None    Gait Pattern Step-through pattern;Decreased step length - right;Decreased step length - left;Decreased stride length;Wide base of support;Poor foot clearance - left;Poor foot clearance - right    Ambulation Surface Level;Indoor      High Level  Balance   High Level Balance Comments in corner on level ground: lateral weight shifting x10 reps, progressing to lateral weight shifting with dynamic SLS (lifting leg off ground for a couple of seconds, need for UE support), then performed lateral weight shifting with eyes closed on level ground 2 x 5 reps and weight shifting from heel > toe with eyes closed 2 x 5 reps, no dizziness reported.       Neuro Re-ed    Neuro Re-ed Details  At countertop (for UE support as needed) - resisted side step to L with green theraband x8 reps then progressing to 2 side steps to L x8 reps, repeated same activity on R with pt taking incr time to catch balance on R, then with posterior resistance pt performing resisted alternating posterior stepping x10 reps, with cues for weight shift and controlled, deliberate movement, pt with no LOB                Balance Exercises - 05/16/20 0001      Balance Exercises: Standing   Wall Bumps Hip;Eyes opened;10 reps;Limitations    Wall Bumps Limitations standing on blue foam, with wide BOS, for hip strategy, cues for deliberate movement    Other Standing Exercises on level ground: with feet apart eyes closed x30 seconds, with feet hip width x30 seconds, feet together eyes closed 1 x 10 seconds (needing min guard/min A on first attempt), 1 x 15 seconds, then 2 x 30 second B    Other Standing Exercises Comments On blue foam: lateral weight shifting x15 reps, A/P weight shifting onto heel and then onto toes x15 reps with no UE support for limits of stability for balance with pt needing to utilize hip and ankle strategy               PT Short Term Goals - 04/29/20 1350      PT SHORT TERM GOAL #1   Title Pt will demonstrate ability to safely perform land and aquatic HEP with husband's supervision    Time 6    Period Weeks    Status Revised    Target Date 06/13/20      PT SHORT TERM GOAL #2   Title Pt will demonstrate 4 point improvement in FGA with LRAD to  indicate decreased falls risk    Baseline 7/30    Time 6    Period Weeks    Status Revised    Target Date 06/13/20      PT SHORT TERM GOAL #3   Title Pt will demonstrate ability to enter/exit pool with ladder with supervision to be able to enter/exit pool at home    Time 6  Period Weeks    Status Revised    Target Date 06/13/20      PT SHORT TERM GOAL #4   Title Pt will improve gait speed with LRAD to >/= 1.4 ft/sec    Baseline .97 ft/sec with RW and Min A    Time 6    Period Weeks    Status Revised    Target Date 06/13/20      PT SHORT TERM GOAL #5   Title Pt will improve cervical extension to >/= 30 deg and bilat rotation to 60 deg to improve mobility for driving    Baseline 15 deg extension; 55 R rotation, 40 L rotation    Time 6    Period Weeks    Status Revised    Target Date 06/13/20      PT SHORT TERM GOAL #6   Title Pt will demonstrate ability to perform sit to stand and stand pivot transfers with LRAD MOD I    Baseline Min A    Time 6    Period Weeks    Status New    Target Date 06/13/20             PT Long Term Goals - 05/05/20 1421      PT LONG TERM GOAL #1   Title Pt will be independent with final HEP in order to build upon functional gains made in therapy.  (ALL GOALS DUE 07/28/20)    Time 12    Period Weeks    Status Revised      PT LONG TERM GOAL #2   Title Pt will ambulate 1000' outside over paved and grassy surfaces without AD, independently to be able to return to golfing    Time 12    Period Weeks    Status Revised      PT LONG TERM GOAL #3   Title Pt will improve FGA score to at least a 20/30 to indicate decreased falls risk in the community    Time 12    Period Weeks    Status Revised      PT LONG TERM GOAL #4   Title Pt will improve B cervical rotation AROM to 65 degrees for functional ROM for driving.    Time 12    Period Weeks    Status Revised      PT LONG TERM GOAL #5   Title Pt will demonstrate ability to perform sit <>  stand and stand pivot without AD independently    Time 12    Period Weeks    Status New      PT LONG TERM GOAL #6   Title Pt will improve gait speed without AD to 2.696ft/sec in order to decr fall risk and improve community mobility.    Time 12    Period Weeks    Status Revised      PT LONG TERM GOAL #7   Title Pt will demonstrate decreased motion sensitivity as indicated by ability to perform bending down to the floor, performing head turns/nods and body turns without any dizziness; pt will also improve use of vestibular system as indicated by ability to maintain 30 seconds on each condition of the MCTSIB.    Baseline see Vestibular tab    Time 12    Period Weeks    Status On-going      PT LONG TERM GOAL #8   Title Pt will demonstrate ability to stand without support x 10 minutes to play corn  hole and demonstrate ability to hit whiffle golf balls in grass x 10 reps with supervision    Time 12    Period Weeks    Status Revised                 Plan - 05/16/20 1722    Clinical Impression Statement Pt ambulating into clinic today without AD - still needing to hold onto walls at times for balance. But pt only needing min guard with intermittent min A at times for episodes of scissoring during gait. Today's session continued to focus on balance strategies on level and non compliant surfaces and with vision removed for incr vestibular input with and without UE support. Pt tolerated session well with no reports of dizziness. Will continue to progress towards LTGs.    Personal Factors and Comorbidities Past/Current Experience;Profession    Examination-Activity Limitations Bend;Carry;Dressing;Locomotion Level;Squat;Stairs;Lift;Stand;Transfers;Hygiene/Grooming;Bathing    Examination-Participation Restrictions Cleaning;Community Activity;Driving;Shop;Laundry    Rehab Potential Good    PT Frequency 2x / week    PT Duration 12 weeks    PT Treatment/Interventions ADLs/Self Care Home  Management;Canalith Repostioning;DME Instruction;Gait training;Stair training;Functional mobility training;Neuromuscular re-education;Balance training;Therapeutic exercise;Therapeutic activities;Patient/family education;Dry needling;Passive range of motion;Vestibular;Aquatic Therapy;Moist Heat;Cryotherapy;Manual techniques    PT Next Visit Plan For aquatic - work on getting in and out of pool with ladder for home pool.  In land and aquatic continue to work on technique for sit <> stand, standing balance, WB for ataxia/coordination and gait/stair training; walking against resistance of sports cord or theraband in various directions.  Ankle and hip strategy training.    Consulted and Agree with Plan of Care Patient           Patient will benefit from skilled therapeutic intervention in order to improve the following deficits and impairments:  Abnormal gait, Decreased activity tolerance, Decreased balance, Decreased coordination, Difficulty walking, Dizziness, Pain, Decreased mobility, Decreased range of motion  Visit Diagnosis: Other symptoms and signs involving the nervous system  Other abnormalities of gait and mobility  Muscle weakness (generalized)  Unsteadiness on feet  Repeated falls     Problem List Patient Active Problem List   Diagnosis Date Noted  . Vitamin D deficiency 01/26/2020  . B12 deficiency 01/25/2020  . Anxiety and depression 01/19/2020  . Alcohol use 01/19/2020  . Concussion with loss of consciousness 01/03/2020  . Thyroid nodule 01/03/2020  . Irregular menses 04/28/2019  . Neuropathy 04/28/2019  . Infertility counseling 10/01/2017  . DDD (degenerative disc disease), cervical 11/29/2016  . Hypoglycemia 11/29/2016  . Kidney stone 11/29/2016  . Sinusitis 11/29/2016  . Insomnia 03/08/2016  . Neck pain 03/08/2016  . 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 [place], [place], [address] Phone: [phone]   Fax:  [phone]  Name: Jane Hendricks MRN: [id number] Date of Birth: November 04, 1981

## 2020-05-16 NOTE — Progress Notes (Signed)
Fortunately MRI brain looks pretty normal

## 2020-05-16 NOTE — Progress Notes (Signed)
MRI thoracic spine looks pretty normal.  No sign of MS.

## 2020-05-16 NOTE — Progress Notes (Signed)
MRI cervical spine shows some arthritis in potential for nerve root pinching but no significant spinal cord injury or evidence of MS

## 2020-05-17 ENCOUNTER — Ambulatory Visit: Payer: 59 | Admitting: Physical Therapy

## 2020-05-17 DIAGNOSIS — R2689 Other abnormalities of gait and mobility: Secondary | ICD-10-CM

## 2020-05-17 DIAGNOSIS — R29818 Other symptoms and signs involving the nervous system: Secondary | ICD-10-CM | POA: Diagnosis not present

## 2020-05-17 DIAGNOSIS — R2681 Unsteadiness on feet: Secondary | ICD-10-CM

## 2020-05-17 DIAGNOSIS — M6281 Muscle weakness (generalized): Secondary | ICD-10-CM

## 2020-05-18 ENCOUNTER — Encounter: Payer: Self-pay | Admitting: Physical Therapy

## 2020-05-18 NOTE — Therapy (Signed)
[facility] [address] [place], [place], [address] Phone: [phone]   Fax:  [phone]  Physical Therapy Treatment  Patient Details  Name: Jane Hendricks MRN: [id number] Date of Birth: 1981/06/23 Referring Provider (PT): Clementeen Graham, MD   Encounter Date: 05/17/2020   PT End of Session - 05/18/20 1518    Visit Number 35    Number of Visits 55    Date for PT Re-Evaluation 07/27/20    Authorization Type [employer] - $40 copay - VL: 60 (PT/OT/ST combined)    Authorization - Visit Number 35    Authorization - Number of Visits 60    PT Start Time 1415    PT Stop Time 1500    PT Time Calculation (min) 45 min    Equipment Utilized During Treatment --   ankle buoyancy cuffs, buoyancy barbells, pool noodle   Activity Tolerance Patient tolerated treatment well    Behavior During Therapy WFL for tasks assessed/performed           Past Medical History:  Diagnosis Date  . Ataxic gait   . Compression fracture of C-spine (HCC) 12/12/2019   C4-C5  . GERD (gastroesophageal reflux disease)   . Herniated disc, cervical   . Hypoglycemia   . Migraines     Past Surgical History:  Procedure Laterality Date  . ANKLE SURGERY    . DILATION AND CURETTAGE OF UTERUS    . WISDOM TOOTH EXTRACTION      There were no vitals filed for this visit.   Subjective Assessment - 05/18/20 1517    Subjective Did some work around house and was going up/down steps but after fatigued she needed to bump up steps on her bottom.    Patient is accompained by: Family member   Husband came to observe session today from pool edge   Pertinent History PMH: hx of herniated disc in cervical spine    Diagnostic tests MRI 2/13 cervical spine: No marrow edema to suggest acute cervical spine fracture. Cervical spondylosis as outlined. No more than mild spinal canalstenosis at any level. Multilevel neural foraminal narrowinggreatest on the left at C4-C5 and  bilaterally at C5-C6(moderate/advanced at these sites).MRI brain: Normal MRI appearance of the brain for age. No evidence of acuteintracranial abnormality    Patient Stated Goals be able to walk and drive again to be more independent; to play golf and cornhole again    Currently in Pain? No/denies           Aquatic therapy at [facility] - pool temp 86.7degrees  Patient seen for aquatic therapy today. Treatment took place in water 3.5-4 feet deep depending upon activity. Pt entered and exited the pool viaramp and supervision.  Runners stretch bil LE's and toes/feet up edge of pool x 30 seconds each bil LE's  Pt performed gait training in pool forwards 73m x 2 repswithout UE support,6m x 2 working on increased armswing,44m x 2 marchingwith hand barbells working on opposite knee to hand, 73m x 2 repsbackwards using barbells for support/buoyancy. Performed side steppingx 33m x 2 then side steppingsquatsx 30m x 2with UE shoulder abd/addand hand bar bells for resistance.Cues for technique/sequence.  Standing at wall for bil squats x 10 x 2.  Added buoyancy cuffs for bil hip flexion, SLR, hip extension, hamstring curl, hip flexion moving into extension, hip abduction, hip adduction all x 10-15 reps each.  Pt needing UE support of pool edge for most exercises.  Performed both alternating and single leg  reps.   Pt requires buoyancy for support with balance and viscosity for resistance for strengthening exercises; buoyancy is also needed for off loading body to assist with exercises.Pt using bil UE's significantly to assist with maintaining balance with activities.     PT Short Term Goals - 04/29/20 1350      PT SHORT TERM GOAL #1   Title Pt will demonstrate ability to safely perform land and aquatic HEP with husband's supervision    Time 6    Period Weeks    Status Revised    Target Date 06/13/20      PT SHORT TERM GOAL #2   Title Pt will demonstrate 4 point  improvement in FGA with LRAD to indicate decreased falls risk    Baseline 7/30    Time 6    Period Weeks    Status Revised    Target Date 06/13/20      PT SHORT TERM GOAL #3   Title Pt will demonstrate ability to enter/exit pool with ladder with supervision to be able to enter/exit pool at home    Time 6    Period Weeks    Status Revised    Target Date 06/13/20      PT SHORT TERM GOAL #4   Title Pt will improve gait speed with LRAD to >/= 1.4 ft/sec    Baseline .97 ft/sec with RW and Min A    Time 6    Period Weeks    Status Revised    Target Date 06/13/20      PT SHORT TERM GOAL #5   Title Pt will improve cervical extension to >/= 30 deg and bilat rotation to 60 deg to improve mobility for driving    Baseline 15 deg extension; 55 R rotation, 40 L rotation    Time 6    Period Weeks    Status Revised    Target Date 06/13/20      PT SHORT TERM GOAL #6   Title Pt will demonstrate ability to perform sit to stand and stand pivot transfers with LRAD MOD I    Baseline Min A    Time 6    Period Weeks    Status New    Target Date 06/13/20             PT Long Term Goals - 05/05/20 1421      PT LONG TERM GOAL #1   Title Pt will be independent with final HEP in order to build upon functional gains made in therapy.  (ALL GOALS DUE 07/28/20)    Time 12    Period Weeks    Status Revised      PT LONG TERM GOAL #2   Title Pt will ambulate 1000' outside over paved and grassy surfaces without AD, independently to be able to return to golfing    Time 12    Period Weeks    Status Revised      PT LONG TERM GOAL #3   Title Pt will improve FGA score to at least a 20/30 to indicate decreased falls risk in the community    Time 12    Period Weeks    Status Revised      PT LONG TERM GOAL #4   Title Pt will improve B cervical rotation AROM to 65 degrees for functional ROM for driving.    Time 12    Period Weeks    Status Revised      PT LONG  TERM GOAL #5   Title Pt will  demonstrate ability to perform sit <> stand and stand pivot without AD independently    Time 12    Period Weeks    Status New      PT LONG TERM GOAL #6   Title Pt will improve gait speed without AD to 2.796ft/sec in order to decr fall risk and improve community mobility.    Time 12    Period Weeks    Status Revised      PT LONG TERM GOAL #7   Title Pt will demonstrate decreased motion sensitivity as indicated by ability to perform bending down to the floor, performing head turns/nods and body turns without any dizziness; pt will also improve use of vestibular system as indicated by ability to maintain 30 seconds on each condition of the MCTSIB.    Baseline see Vestibular tab    Time 12    Period Weeks    Status On-going      PT LONG TERM GOAL #8   Title Pt will demonstrate ability to stand without support x 10 minutes to play corn hole and demonstrate ability to hit whiffle golf balls in grass x 10 reps with supervision    Time 12    Period Weeks    Status Revised                 Plan - 05/18/20 1519    Clinical Impression Statement Pt with improvement in balance since last time pt arrived for aquatic session.  No longer using AD unless fatigued.  Does report several recent falls.  Continues to be challenged by coordination, multitasking and balance.  Cont per poc.    Personal Factors and Comorbidities Past/Current Experience;Profession    Examination-Activity Limitations Bend;Carry;Dressing;Locomotion Level;Squat;Stairs;Lift;Stand;Transfers;Hygiene/Grooming;Bathing    Examination-Participation Restrictions Cleaning;Community Activity;Driving;Shop;Laundry    Rehab Potential Good    PT Frequency 2x / week    PT Duration 12 weeks    PT Treatment/Interventions ADLs/Self Care Home Management;Canalith Repostioning;DME Instruction;Gait training;Stair training;Functional mobility training;Neuromuscular re-education;Balance training;Therapeutic exercise;Therapeutic  activities;Patient/family education;Dry needling;Passive range of motion;Vestibular;Aquatic Therapy;Moist Heat;Cryotherapy;Manual techniques    PT Next Visit Plan In land and aquatic continue to work on technique for sit <> stand, standing balance, WB for ataxia/coordination and gait/stair training; walking against resistance of sports cord or theraband in various directions.  Ankle and hip strategy training.    Consulted and Agree with Plan of Care Patient           Patient will benefit from skilled therapeutic intervention in order to improve the following deficits and impairments:  Abnormal gait, Decreased activity tolerance, Decreased balance, Decreased coordination, Difficulty walking, Dizziness, Pain, Decreased mobility, Decreased range of motion  Visit Diagnosis: Other symptoms and signs involving the nervous system  Other abnormalities of gait and mobility  Muscle weakness (generalized)  Unsteadiness on feet     Problem List Patient Active Problem List   Diagnosis Date Noted  . Vitamin D deficiency 01/26/2020  . B12 deficiency 01/25/2020  . Anxiety and depression 01/19/2020  . Alcohol use 01/19/2020  . Concussion with loss of consciousness 01/03/2020  . Thyroid nodule 01/03/2020  . Irregular menses 04/28/2019  . Neuropathy 04/28/2019  . Infertility counseling 10/01/2017  . DDD (degenerative disc disease), cervical 11/29/2016  . Hypoglycemia 11/29/2016  . Kidney stone 11/29/2016  . Sinusitis 11/29/2016  . Insomnia 03/08/2016  . Neck pain 03/08/2016  . 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 [place], [place], [address] Phone: [phone]   Fax:  [phone]  Name: Jane Hendricks MRN: [id number] Date of Birth: Mar 25, 1981

## 2020-05-20 ENCOUNTER — Other Ambulatory Visit: Payer: Self-pay | Admitting: Family Medicine

## 2020-05-21 ENCOUNTER — Other Ambulatory Visit: Payer: Self-pay | Admitting: Physician Assistant

## 2020-05-22 MED ORDER — MUPIROCIN 2 % EX OINT: TOPICAL_OINTMENT | CUTANEOUS | 0 refills | Status: AC

## 2020-05-22 NOTE — Telephone Encounter (Signed)
Please advise 

## 2020-05-23 MED ORDER — HYDROCODONE-ACETAMINOPHEN 5-325 MG PO TABS: 1.0000 | ORAL_TABLET | Freq: Four times a day (QID) | ORAL | 0 refills | Status: DC | PRN

## 2020-05-26 ENCOUNTER — Other Ambulatory Visit: Payer: Self-pay

## 2020-05-26 ENCOUNTER — Ambulatory Visit: Payer: 59 | Admitting: Psychology

## 2020-05-26 ENCOUNTER — Ambulatory Visit: Payer: 59

## 2020-05-26 DIAGNOSIS — R41841 Cognitive communication deficit: Secondary | ICD-10-CM

## 2020-05-26 DIAGNOSIS — R29818 Other symptoms and signs involving the nervous system: Secondary | ICD-10-CM | POA: Diagnosis not present

## 2020-05-26 NOTE — Patient Instructions (Signed)
  You are doing many of the things we recommend for compensating for your memory skills.   Continue to talk with your psychologist about the feelings you have about your decreased memory and thinking skills.  Talk to your neurologist about whether or not these are symptoms of some degenerative neurological disease.
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
## 2020-05-29 ENCOUNTER — Encounter: Payer: Self-pay | Admitting: Speech Pathology

## 2020-05-29 ENCOUNTER — Ambulatory Visit: Payer: 59 | Admitting: Physical Therapy

## 2020-05-29 ENCOUNTER — Ambulatory Visit: Payer: 59 | Admitting: Speech Pathology

## 2020-05-29 ENCOUNTER — Other Ambulatory Visit: Payer: Self-pay

## 2020-05-29 ENCOUNTER — Encounter: Payer: Self-pay | Admitting: Physical Therapy

## 2020-05-29 DIAGNOSIS — R41841 Cognitive communication deficit: Secondary | ICD-10-CM

## 2020-05-29 DIAGNOSIS — R2681 Unsteadiness on feet: Secondary | ICD-10-CM

## 2020-05-29 DIAGNOSIS — R2689 Other abnormalities of gait and mobility: Secondary | ICD-10-CM

## 2020-05-29 DIAGNOSIS — M6281 Muscle weakness (generalized): Secondary | ICD-10-CM

## 2020-05-29 DIAGNOSIS — R29818 Other symptoms and signs involving the nervous system: Secondary | ICD-10-CM | POA: Diagnosis not present

## 2020-05-29 NOTE — Therapy (Signed)
[facility] [address] [place], [place], [address] Phone: [phone]   Fax:  [phone]  Physical Therapy Treatment  Patient Details  Name: Jane Hendricks MRN: [id number] Date of Birth: 11-30-1980 Referring Provider (PT): Clementeen Graham, MD   Encounter Date: 05/29/2020   PT End of Session - 05/29/20 1900    Visit Number 36    Number of Visits 55    Date for PT Re-Evaluation 07/27/20    Authorization Type [employer] - $40 copay - VL: 60 (PT/OT/ST combined)    Authorization - Visit Number 36    Authorization - Number of Visits 60    PT Start Time 1500    PT Stop Time 1545    PT Time Calculation (min) 45 min    Equipment Utilized During Treatment Other (comment)   buoyancy barbells   Activity Tolerance Patient tolerated treatment well    Behavior During Therapy WFL for tasks assessed/performed           Past Medical History:  Diagnosis Date  . Ataxic gait   . Compression fracture of C-spine (HCC) 12/12/2019   C4-C5  . GERD (gastroesophageal reflux disease)   . Herniated disc, cervical   . Hypoglycemia   . Migraines     Past Surgical History:  Procedure Laterality Date  . ANKLE SURGERY    . DILATION AND CURETTAGE OF UTERUS    . WISDOM TOOTH EXTRACTION      There were no vitals filed for this visit.   Subjective Assessment - 05/29/20 1856    Subjective Denies any falls.  Is moving better and feels like her decline is stress situational related.    Patient is accompained by: Family member   Husband came to observe session today from pool edge   Pertinent History PMH: hx of herniated disc in cervical spine    Diagnostic tests MRI 2/13 cervical spine: No marrow edema to suggest acute cervical spine fracture. Cervical spondylosis as outlined. No more than mild spinal canalstenosis at any level. Multilevel neural foraminal narrowinggreatest on the left at C4-C5 and bilaterally at C5-C6(moderate/advanced at these  sites).MRI brain: Normal MRI appearance of the brain for age. No evidence of acuteintracranial abnormality    Patient Stated Goals be able to walk and drive again to be more independent; to play golf and cornhole again    Currently in Pain? No/denies           Aquatic therapy at [facility] - pool temp 86.7degrees  Patient seen for aquatic therapy today. Treatment took place in water 3.5-4 feet deep depending upon activity. Pt entered and exited the pool viaramp and supervision.  Runners stretch bil LE's and toes/feet up edge of pool x 30 seconds each bil LE's  Pt performed gait training in pool forwards 75m x 2 repswithout UE support,14m x 2 working on increased armswing,34m x 2 marchingwith hand barbells working on opposite knee to hand, 60m x 2 repsbackwards. Performed side steppingsquatsx 39m x 2with UE shoulder abd/addand hand bar bells for resistance.Cues for technique/sequence.  Standing at wall for bil squats x 10 x 2.  Single leg squats x 10 with UE support.  Jumping jacks x 10 x 2 reps and forward/backward lunges x 10.  Pt performed bil hip flexion, SLR, hip extension, hamstring curl, hip flexion moving into extension, hip abduction, hip adduction all x 10-15 reps each.  Pt needing 1 UE support of pool edge for most exercises.  Performed both alternating and single  leg reps.  Braiding 100m x 2 reps  Pt requires buoyancy for support with balance and viscosity for resistance for strengthening exercises; buoyancy is also needed for off loading body to assist with exercises.Pt using bil UE's significantly to assist with maintaining balance with activities.      PT Short Term Goals - 04/29/20 1350      PT SHORT TERM GOAL #1   Title Pt will demonstrate ability to safely perform land and aquatic HEP with husband's supervision    Time 6    Period Weeks    Status Revised    Target Date 06/13/20      PT SHORT TERM GOAL #2   Title Pt will demonstrate 4 point  improvement in FGA with LRAD to indicate decreased falls risk    Baseline 7/30    Time 6    Period Weeks    Status Revised    Target Date 06/13/20      PT SHORT TERM GOAL #3   Title Pt will demonstrate ability to enter/exit pool with ladder with supervision to be able to enter/exit pool at home    Time 6    Period Weeks    Status Revised    Target Date 06/13/20      PT SHORT TERM GOAL #4   Title Pt will improve gait speed with LRAD to >/= 1.4 ft/sec    Baseline .97 ft/sec with RW and Min A    Time 6    Period Weeks    Status Revised    Target Date 06/13/20      PT SHORT TERM GOAL #5   Title Pt will improve cervical extension to >/= 30 deg and bilat rotation to 60 deg to improve mobility for driving    Baseline 15 deg extension; 55 R rotation, 40 L rotation    Time 6    Period Weeks    Status Revised    Target Date 06/13/20      PT SHORT TERM GOAL #6   Title Pt will demonstrate ability to perform sit to stand and stand pivot transfers with LRAD MOD I    Baseline Min A    Time 6    Period Weeks    Status New    Target Date 06/13/20             PT Long Term Goals - 05/05/20 1421      PT LONG TERM GOAL #1   Title Pt will be independent with final HEP in order to build upon functional gains made in therapy.  (ALL GOALS DUE 07/28/20)    Time 12    Period Weeks    Status Revised      PT LONG TERM GOAL #2   Title Pt will ambulate 1000' outside over paved and grassy surfaces without AD, independently to be able to return to golfing    Time 12    Period Weeks    Status Revised      PT LONG TERM GOAL #3   Title Pt will improve FGA score to at least a 20/30 to indicate decreased falls risk in the community    Time 12    Period Weeks    Status Revised      PT LONG TERM GOAL #4   Title Pt will improve B cervical rotation AROM to 65 degrees for functional ROM for driving.    Time 12    Period Weeks    Status Revised  PT LONG TERM GOAL #5   Title Pt will  demonstrate ability to perform sit <> stand and stand pivot without AD independently    Time 12    Period Weeks    Status New      PT LONG TERM GOAL #6   Title Pt will improve gait speed without AD to 2.54ft/sec in order to decr fall risk and improve community mobility.    Time 12    Period Weeks    Status Revised      PT LONG TERM GOAL #7   Title Pt will demonstrate decreased motion sensitivity as indicated by ability to perform bending down to the floor, performing head turns/nods and body turns without any dizziness; pt will also improve use of vestibular system as indicated by ability to maintain 30 seconds on each condition of the MCTSIB.    Baseline see Vestibular tab    Time 12    Period Weeks    Status On-going      PT LONG TERM GOAL #8   Title Pt will demonstrate ability to stand without support x 10 minutes to play corn hole and demonstrate ability to hit whiffle golf balls in grass x 10 reps with supervision    Time 12    Period Weeks    Status Revised                 Plan - 05/29/20 1901    Clinical Impression Statement Pt with improvement in overall mobility since last session and able to perform higher level balance activties today.  Was able to try some of the exercises in a friend's pool.  Cont per poc.    Personal Factors and Comorbidities Past/Current Experience;Profession    Examination-Activity Limitations Bend;Carry;Dressing;Locomotion Level;Squat;Stairs;Lift;Stand;Transfers;Hygiene/Grooming;Bathing    Examination-Participation Restrictions Cleaning;Community Activity;Driving;Shop;Laundry    Rehab Potential Good    PT Frequency 2x / week    PT Duration 12 weeks    PT Treatment/Interventions ADLs/Self Care Home Management;Canalith Repostioning;DME Instruction;Gait training;Stair training;Functional mobility training;Neuromuscular re-education;Balance training;Therapeutic exercise;Therapeutic activities;Patient/family education;Dry needling;Passive range of  motion;Vestibular;Aquatic Therapy;Moist Heat;Cryotherapy;Manual techniques    PT Next Visit Plan In land and aquatic continue to work on technique for sit <> stand, standing balance, WB for ataxia/coordination and gait/stair training; walking against resistance of sports cord or theraband in various directions.  Ankle and hip strategy training.    Consulted and Agree with Plan of Care Patient           Patient will benefit from skilled therapeutic intervention in order to improve the following deficits and impairments:  Abnormal gait, Decreased activity tolerance, Decreased balance, Decreased coordination, Difficulty walking, Dizziness, Pain, Decreased mobility, Decreased range of motion  Visit Diagnosis: Other abnormalities of gait and mobility  Muscle weakness (generalized)  Unsteadiness on feet     Problem List Patient Active Problem List   Diagnosis Date Noted  . Vitamin D deficiency 01/26/2020  . B12 deficiency 01/25/2020  . Anxiety and depression 01/19/2020  . Alcohol use 01/19/2020  . Concussion with loss of consciousness 01/03/2020  . Thyroid nodule 01/03/2020  . Irregular menses 04/28/2019  . Neuropathy 04/28/2019  . Infertility counseling 10/01/2017  . DDD (degenerative disc disease), cervical 11/29/2016  . Hypoglycemia 11/29/2016  . Kidney stone 11/29/2016  . Sinusitis 11/29/2016  . Insomnia 03/08/2016  . Neck pain 03/08/2016  . Parasomnia 03/08/2016    Newell Coral, PTA [facility] 05/29/20 7:07 PM Phone: [phone] Fax: [phone]   [facility] [address] [place], [place], [address] Phone: [phone]   Fax:  [phone]  Name: Jane Hendricks MRN: [id number] Date of Birth: 16-Mar-1981

## 2020-05-29 NOTE — Patient Instructions (Addendum)
   Give your employees permission to hold up their hand or finger when you are repeating yourself or have asked the question already  Then ask employee what was the result of the conversation and write it down  Repeat back what you are being heard then take notes  Ask for information in writing. "My doctor says my processing is slightly slower and I need important information in writing" "I'll look for an email from you today" OR you email what you understand and ask in email for clarification  Being in the moment in the evening is more difficult because you are fatigued from the day and you go on autopilot and just go through the motions of what you need to do  Stress at work also affects your attention to mundane, home tasks and is internally distracting

## 2020-05-31 ENCOUNTER — Ambulatory Visit: Payer: 59 | Admitting: Physical Therapy

## 2020-05-31 ENCOUNTER — Other Ambulatory Visit: Payer: Self-pay

## 2020-05-31 DIAGNOSIS — R2681 Unsteadiness on feet: Secondary | ICD-10-CM

## 2020-05-31 DIAGNOSIS — R2689 Other abnormalities of gait and mobility: Secondary | ICD-10-CM

## 2020-05-31 DIAGNOSIS — R42 Dizziness and giddiness: Secondary | ICD-10-CM

## 2020-05-31 DIAGNOSIS — R296 Repeated falls: Secondary | ICD-10-CM

## 2020-05-31 DIAGNOSIS — M6281 Muscle weakness (generalized): Secondary | ICD-10-CM

## 2020-05-31 DIAGNOSIS — R29818 Other symptoms and signs involving the nervous system: Secondary | ICD-10-CM | POA: Diagnosis not present

## 2020-05-31 NOTE — Therapy (Signed)
[facility] [address] [place], [place], [address] Phone: [phone]   Fax:  [phone]  Physical Therapy Treatment  Patient Details   Name: Jane Hendricks MRN: [id number] Date of Birth: 1981-08-22 Referring Provider (PT): Clementeen GrahamEvan Corey, MD   Encounter Date: 05/31/2020   PT End of Session - 05/31/20 1627    Visit Number 37    Number of Visits 55    Date for PT Re-Evaluation 07/27/20    Authorization Type [employer] - $40 copay - VL: 60 (PT/OT/ST combined)    Authorization - Visit Number 37    Authorization - Number of Visits 60    PT Start Time 1449    PT Stop Time 1531    PT Time Calculation (min) 42 min    Activity Tolerance Patient tolerated treatment well   limited by dizziness at times   Behavior [facility] for tasks assessed/performed           Past Medical History:  Diagnosis Date  . Ataxic gait   . Compression fracture of C-spine (HCC) 12/12/2019   C4-C5  . GERD (gastroesophageal reflux disease)   . Herniated disc, cervical   . Hypoglycemia   . Migraines     Past Surgical History:  Procedure Laterality Date  . ANKLE SURGERY    . DILATION AND CURETTAGE OF UTERUS    . WISDOM TOOTH EXTRACTION      There were no vitals filed for this visit.   Subjective Assessment - 05/31/20 1452    Subjective Reports that she notices her declines are when she has a stressful situation. Has had no falls. Co-worker has caught pt a couple of times to prevent from falling or falls into things to prevent herself from falling.    Pertinent History PMH: hx of herniated disc in cervical spine    Diagnostic tests MRI 2/13 cervical spine: No marrow edema to suggest acute cervical spine fracture. Cervical spondylosis as outlined. No more than mild spinal canalstenosis at any level. Multilevel neural foraminal narrowinggreatest on the left at C4-C5 and bilaterally at C5-C6(moderate/advanced at these sites).MRI brain:  Normal MRI appearance of the brain for age. No evidence of acuteintracranial abnormality    Patient Stated Goals be able to walk and drive again to be more independent; to play golf and cornhole again                             [facility]/Exercise - 05/31/20 0001      Ambulation/Gait   Ambulation/Gait Yes    Ambulation/Gait Assistance 4: Min guard;4: Min assist    Ambulation/Gait Assistance Details pt needing intermittent min A at times for balance due to scissoring and ambulating with a narrow BOS, pt at times reaching out for objects to touch for balance. pt remains guarded with walking with decr B arm swing and ambulating with striking forefoot first vs. using heel strike    Ambulation Distance (Feet) 230 Feet    Assistive device None    Gait Pattern Step-through pattern;Decreased step length - right;Decreased step length - left;Decreased stride length;Wide base of support;Poor foot clearance - left;Poor foot clearance - right    Ambulation Surface Level;Indoor               Balance Exercises - 05/31/20 0001      Balance Exercises: Standing   Standing Eyes Closed Wide (BOA);Foam/compliant surface;Limitations    Standing Eyes  Closed Limitations on single pillow: x15 seconds and additional 2 x 30 seconds, pt with no reports of dizziness    SLS Eyes open;Solid surface    SLS Limitations in corner: alternating toe taps to forward cones, beginning with BUE support (one hand on wall and one hand on chair) progressing to single UE support, fingertip support and single fingertip support (with pt initially losing balance but improving with incr reps), approx. 20 reps total, pt with incr dizziness after performing and needing seated rest break. needed cues for slowed and controlled with deliberate movement and tapping toe to cone    Other Standing Exercises on single pillow: performed first with fingertip support weight shifting onto heels and onto toes, cues  for slowed and controlled with pt intermittently using wall posteriorly to catch balance, and progressing to no UE support, performed with fingertip support and with eyes closed for 5 reps and pt reporting dizziness incr to a 4/10    Other Standing Exercises Comments Performed resisted gait with belt around pt from equalizer machine: forward gait and then against resistance alternating forward steps x10 reps B with use of colorful floor pads for visual cue for stepping to decr narrow BOS and improve step length - cues for weight shift, needing min guard/min A for balance. Side stepping both directions down and back approx. 10-15' with cues for deliberate movement and for weight shifting, min A for balance at times.                PT Short Term Goals - 04/29/20 1350      PT SHORT TERM GOAL #1   Title Pt will demonstrate ability to safely perform land and aquatic HEP with husband's supervision    Time 6    Period Weeks    Status Revised    Target Date 06/13/20      PT SHORT TERM GOAL #2   Title Pt will demonstrate 4 point improvement in FGA with LRAD to indicate decreased falls risk    Baseline 7/30    Time 6    Period Weeks    Status Revised    Target Date 06/13/20      PT SHORT TERM GOAL #3   Title Pt will demonstrate ability to enter/exit pool with ladder with supervision to be able to enter/exit pool at home    Time 6    Period Weeks    Status Revised    Target Date 06/13/20      PT SHORT TERM GOAL #4   Title Pt will improve gait speed with LRAD to >/= 1.4 ft/sec    Baseline .97 ft/sec with RW and Min A    Time 6    Period Weeks    Status Revised    Target Date 06/13/20      PT SHORT TERM GOAL #5   Title Pt will improve cervical extension to >/= 30 deg and bilat rotation to 60 deg to improve mobility for driving    Baseline 15 deg extension; 55 R rotation, 40 L rotation    Time 6    Period Weeks    Status Revised    Target Date 06/13/20      PT SHORT TERM GOAL #6     Title Pt will demonstrate ability to perform sit to stand and stand pivot transfers with LRAD MOD I    Baseline Min A    Time 6    Period Weeks    Status New  Target Date 06/13/20             PT Long Term Goals - 05/05/20 1421      PT LONG TERM GOAL #1   Title Pt will be independent with final HEP in order to build upon functional gains made in therapy.  (ALL GOALS DUE 07/28/20)    Time 12    Period Weeks    Status Revised      PT LONG TERM GOAL #2   Title Pt will ambulate 1000' outside over paved and grassy surfaces without AD, independently to be able to return to golfing    Time 12    Period Weeks    Status Revised      PT LONG TERM GOAL #3   Title Pt will improve FGA score to at least a 20/30 to indicate decreased falls risk in the community    Time 12    Period Weeks    Status Revised      PT LONG TERM GOAL #4   Title Pt will improve B cervical rotation AROM to 65 degrees for functional ROM for driving.    Time 12    Period Weeks    Status Revised      PT LONG TERM GOAL #5   Title Pt will demonstrate ability to perform sit <> stand and stand pivot without AD independently    Time 12    Period Weeks    Status New      PT LONG TERM GOAL #6   Title Pt will improve gait speed without AD to 2.3ft/sec in order to decr fall risk and improve community mobility.    Time 12    Period Weeks    Status Revised      PT LONG TERM GOAL #7   Title Pt will demonstrate decreased motion sensitivity as indicated by ability to perform bending down to the floor, performing head turns/nods and body turns without any dizziness; pt will also improve use of vestibular system as indicated by ability to maintain 30 seconds on each condition of the MCTSIB.    Baseline see Vestibular tab    Time 12    Period Weeks    Status On-going      PT LONG TERM GOAL #8   Title Pt will demonstrate ability to stand without support x 10 minutes to play corn hole and demonstrate ability to hit  whiffle golf balls in grass x 10 reps with supervision    Time 12    Period Weeks    Status Revised                 Plan - 05/31/20 1642    Clinical Impression Statement Today's skilled session continued to focus on gait training and balance strategies working on decr UE support. Pt needing intermittent min A at times for balance during gait due to scissoring and incr narrow BOS. Pt reporting incr dizziness after eyes closed with weight shift on single pillow and after looking down at cones during SLS activity - resolved with brief rest break. Will continue to progress towards LTGs.    Personal Factors and Comorbidities Past/Current Experience;Profession    Examination-Activity Limitations Bend;Carry;Dressing;Locomotion Level;Squat;Stairs;Lift;Stand;Transfers;Hygiene/Grooming;Bathing    Examination-Participation Restrictions Cleaning;Community Activity;Driving;Shop;Laundry    Rehab Potential Good    PT Frequency 2x / week    PT Duration 12 weeks    PT Treatment/Interventions ADLs/Self Care Home Management;Canalith Repostioning;DME Instruction;Gait training;Stair training;Functional mobility training;Neuromuscular re-education;Balance training;Therapeutic exercise;Therapeutic activities;Patient/family education;Dry  needling;Passive range of motion;Vestibular;Aquatic Therapy;Moist Heat;Cryotherapy;Manual techniques    PT Next Visit Plan standing balance with decreasing UE support, SLS activities, WB for ataxia/coordination and gait/stair training; walking against resistance of sports cord or theraband in various directions.  Ankle and hip strategy training.    Consulted and Agree with Plan of Care Patient           Patient will benefit from skilled therapeutic intervention in order to improve the following deficits and impairments:  Abnormal gait, Decreased activity tolerance, Decreased balance, Decreased coordination, Difficulty walking, Dizziness, Pain, Decreased mobility, Decreased  range of motion  Visit Diagnosis: Other abnormalities of gait and mobility  Muscle weakness (generalized)  Unsteadiness on feet  Dizziness and giddiness  Repeated falls     Problem List Patient Active Problem List   Diagnosis Date Noted  . Vitamin D deficiency 01/26/2020  . B12 deficiency 01/25/2020  . Anxiety and depression 01/19/2020  . Alcohol use 01/19/2020  . Concussion with loss of consciousness 01/03/2020  . Thyroid nodule 01/03/2020  . Irregular menses 04/28/2019  . Neuropathy 04/28/2019  . Infertility counseling 10/01/2017  . DDD (degenerative disc disease), cervical 11/29/2016  . Hypoglycemia 11/29/2016  . Kidney stone 11/29/2016  . Sinusitis 11/29/2016  . Insomnia 03/08/2016  . Neck pain 03/08/2016  . Parasomnia 03/08/2016    Drake Leach, PT, DPT  05/31/2020, 4:44 PM  [facility] [address] [place], [place], [address] Phone: [phone]   Fax:  [phone]  Name: Jane Hendricks MRN: [id number] Date of Birth: 11-27-1980
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
## 2020-06-06 ENCOUNTER — Other Ambulatory Visit: Payer: Self-pay

## 2020-06-06 ENCOUNTER — Ambulatory Visit: Payer: 59 | Attending: Physician Assistant

## 2020-06-06 DIAGNOSIS — R2689 Other abnormalities of gait and mobility: Secondary | ICD-10-CM | POA: Insufficient documentation

## 2020-06-06 DIAGNOSIS — R29818 Other symptoms and signs involving the nervous system: Secondary | ICD-10-CM | POA: Diagnosis present

## 2020-06-06 DIAGNOSIS — R2681 Unsteadiness on feet: Secondary | ICD-10-CM | POA: Diagnosis present

## 2020-06-06 DIAGNOSIS — R41841 Cognitive communication deficit: Secondary | ICD-10-CM | POA: Insufficient documentation

## 2020-06-06 DIAGNOSIS — R42 Dizziness and giddiness: Secondary | ICD-10-CM | POA: Diagnosis present

## 2020-06-06 DIAGNOSIS — M542 Cervicalgia: Secondary | ICD-10-CM | POA: Diagnosis present

## 2020-06-06 DIAGNOSIS — M6281 Muscle weakness (generalized): Secondary | ICD-10-CM | POA: Insufficient documentation
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
## 2020-06-07 ENCOUNTER — Encounter: Payer: Self-pay | Admitting: Physical Therapy

## 2020-06-07 ENCOUNTER — Ambulatory Visit: Payer: 59 | Admitting: Physical Therapy

## 2020-06-07 DIAGNOSIS — R42 Dizziness and giddiness: Secondary | ICD-10-CM

## 2020-06-07 DIAGNOSIS — R2681 Unsteadiness on feet: Secondary | ICD-10-CM

## 2020-06-07 DIAGNOSIS — R41841 Cognitive communication deficit: Secondary | ICD-10-CM | POA: Diagnosis not present

## 2020-06-07 DIAGNOSIS — M6281 Muscle weakness (generalized): Secondary | ICD-10-CM

## 2020-06-07 DIAGNOSIS — M542 Cervicalgia: Secondary | ICD-10-CM

## 2020-06-07 DIAGNOSIS — R2689 Other abnormalities of gait and mobility: Secondary | ICD-10-CM

## 2020-06-07 DIAGNOSIS — R29818 Other symptoms and signs involving the nervous system: Secondary | ICD-10-CM

## 2020-06-07 NOTE — Therapy (Signed)
[facility] [address] [place], [place], [address] Phone: [phone]   Fax:  [phone]  Physical Therapy Treatment  Patient Details  Name: Jane Hendricks MRN: [id number] Date of Birth: February 11, 1981 Referring Provider (PT): Clementeen GrahamEvan Corey, MD   Encounter Date: 06/07/2020   PT End of Session - 06/07/20 2113    Visit Number 38    Number of Visits 55    Date for PT Re-Evaluation 07/27/20    Authorization Type [employer] - $40 copay - VL: 60 (PT/OT/ST combined) hard max; each discipline counts as one visit.  Beyond 60 visits pt would be considered self-pay and would have to submit bills to third party payer for reimbursement    Authorization - Visit Number 44   PT/ST combined   Authorization - Number of Visits 60    PT Start Time 1452    PT Stop Time 1535    PT Time Calculation (min) 43 min    Equipment Utilized During Treatment Other (comment)   dry needles   Activity Tolerance Patient tolerated treatment well    Behavior During Therapy WFL for tasks assessed/performed           Past Medical History:  Diagnosis Date  . Ataxic gait   . Compression fracture of C-spine (HCC) 12/12/2019   C4-C5  . GERD (gastroesophageal reflux disease)   . Herniated disc, cervical   . Hypoglycemia   . Migraines     Past Surgical History:  Procedure Laterality Date  . ANKLE SURGERY    . DILATION AND CURETTAGE OF UTERUS    . WISDOM TOOTH EXTRACTION      There were no vitals filed for this visit.   Subjective Assessment - 06/07/20 1456    Subjective Reports sit <> stand and ambulation do become more difficult in the evening.  Still having issues with memory - not able to recall what she ate the night before or what movie they watched.  Over the past 5 days, dizziness and nausea that starts spontaneously - pt reports it is triggered by a stressful event.    Pertinent History PMH: hx of herniated disc in cervical spine    Diagnostic  tests MRI 2/13 cervical spine: No marrow edema to suggest acute cervical spine fracture. Cervical spondylosis as outlined. No more than mild spinal canalstenosis at any level. Multilevel neural foraminal narrowinggreatest on the left at C4-C5 and bilaterally at C5-C6(moderate/advanced at these sites).MRI brain: Normal MRI appearance of the brain for age. No evidence of acuteintracranial abnormality    Patient Stated Goals be able to walk and drive again to be more independent; to play golf and cornhole again    Currently in Pain? No/denies              [facility] - 06/07/20 1502      ROM / Strength   AROM / PROM / Strength AROM      AROM   Overall AROM  Deficits    Overall AROM Comments pt feels like neck is much tighter; is driving but has to turn her whole body to see to the side instead of neck.  3/10 neck pain at baseline.  First readings are ROM prior to TDN; second numbers are ROM recordings after TDN    AROM Assessment Site Cervical    Cervical Flexion 20 most movement from upper cervical spine > 25    Cervical Extension 30 > 40    Cervical - Right Side Bend  20 > 25    Cervical - Left Side Bend 25 > 30    Cervical - Right Rotation 50    Cervical - Left Rotation 40 > 45                         [facility] Adult PT Treatment/Exercise - 06/07/20 2111      Ambulation/Gait   Ambulation/Gait Yes    Ambulation/Gait Assistance 6: Modified independent (Device/Increase time)    Ambulation/Gait Assistance Details no difficulty with sit > stand or initiating gait in waiting room.  Pt ambulating in heels.  Pt able to turn head and speak to therapist while walking back to treatment room without LOB.  After TDN pt slightly unsteady due to mild dizziness but able to safely ambulate out to waiting area    Ambulation Distance (Feet) 200 Feet    Assistive device None    Gait Pattern Step-through pattern    Ambulation Surface Level;Indoor            Trigger Point Dry  Needling - 06/07/20 1511    Consent Given? Yes    Education Handout Provided No    Muscles Treated Head and Neck Upper trapezius;Splenius capitus;Semispinalis capitus;Cervical multifidi    Dry Needling Comments Performed in prone.  Performed to paraspinal muscles on L side; upper trapezius bilaterally. Mild dizziness when rising after TDN.    Upper Trapezius Response Twitch reponse elicited;Palpable increased muscle length    Splenius capitus Response Twitch reponse elicited;Palpable increased muscle length    Semispinalis capitus Response Twitch reponse elicited;Palpable increased muscle length    Cervical multifidi Response Twitch reponse elicited;Palpable increased muscle length                PT Education - 06/07/20 2118    Education Details educated to continue neck stretches after TDN today    Person(s) Educated Patient    Methods Explanation;Demonstration    Comprehension Verbalized understanding            PT Short Term Goals - 04/29/20 1350      PT SHORT TERM GOAL #1   Title Pt will demonstrate ability to safely perform land and aquatic HEP with husband's supervision    Time 6    Period Weeks    Status Revised    Target Date 06/13/20      PT SHORT TERM GOAL #2   Title Pt will demonstrate 4 point improvement in FGA with LRAD to indicate decreased falls risk    Baseline 7/30    Time 6    Period Weeks    Status Revised    Target Date 06/13/20      PT SHORT TERM GOAL #3   Title Pt will demonstrate ability to enter/exit pool with ladder with supervision to be able to enter/exit pool at home    Time 6    Period Weeks    Status Revised    Target Date 06/13/20      PT SHORT TERM GOAL #4   Title Pt will improve gait speed with LRAD to >/= 1.4 ft/sec    Baseline .97 ft/sec with RW and Min A    Time 6    Period Weeks    Status Revised    Target Date 06/13/20      PT SHORT TERM GOAL #5   Title Pt will improve cervical extension to >/= 30 deg and bilat  rotation to 60 deg to improve mobility  for driving    Baseline 15 deg extension; 55 R rotation, 40 L rotation    Time 6    Period Weeks    Status Revised    Target Date 06/13/20      PT SHORT TERM GOAL #6   Title Pt will demonstrate ability to perform sit to stand and stand pivot transfers with LRAD MOD I    Baseline Min A    Time 6    Period Weeks    Status New    Target Date 06/13/20             PT Long Term Goals - 05/05/20 1421      PT LONG TERM GOAL #1   Title Pt will be independent with final HEP in order to build upon functional gains made in therapy.  (ALL GOALS DUE 07/28/20)    Time 12    Period Weeks    Status Revised      PT LONG TERM GOAL #2   Title Pt will ambulate 1000' outside over paved and grassy surfaces without AD, independently to be able to return to golfing    Time 12    Period Weeks    Status Revised      PT LONG TERM GOAL #3   Title Pt will improve FGA score to at least a 20/30 to indicate decreased falls risk in the community    Time 12    Period Weeks    Status Revised      PT LONG TERM GOAL #4   Title Pt will improve B cervical rotation AROM to 65 degrees for functional ROM for driving.    Time 12    Period Weeks    Status Revised      PT LONG TERM GOAL #5   Title Pt will demonstrate ability to perform sit <> stand and stand pivot without AD independently    Time 12    Period Weeks    Status New      PT LONG TERM GOAL #6   Title Pt will improve gait speed without AD to 2.66ft/sec in order to decr fall risk and improve community mobility.    Time 12    Period Weeks    Status Revised      PT LONG TERM GOAL #7   Title Pt will demonstrate decreased motion sensitivity as indicated by ability to perform bending down to the floor, performing head turns/nods and body turns without any dizziness; pt will also improve use of vestibular system as indicated by ability to maintain 30 seconds on each condition of the MCTSIB.    Baseline see  Vestibular tab    Time 12    Period Weeks    Status On-going      PT LONG TERM GOAL #8   Title Pt will demonstrate ability to stand without support x 10 minutes to play corn hole and demonstrate ability to hit whiffle golf balls in grass x 10 reps with supervision    Time 12    Period Weeks    Status Revised                 Plan - 06/07/20 2116    Clinical Impression Statement Following patient's decline in mobility and increase in stressors pt reporting return of neck/shoulder tension, pain and decreased ROM when driving.  Performed re-assessment of cervical spine ROM and addressed muscle tension with trigger point dry needling with overall improvement in all ROM  by 5 degrees at end of session.  Will continue to address as needed.    Personal Factors and Comorbidities Past/Current Experience;Profession    Examination-Activity Limitations Bend;Carry;Dressing;Locomotion Level;Squat;Stairs;Lift;Stand;Transfers;Hygiene/Grooming;Bathing    Examination-Participation Restrictions Cleaning;Community Activity;Driving;Shop;Laundry    Rehab Potential Good    PT Frequency 2x / week    PT Duration 12 weeks    PT Treatment/Interventions ADLs/Self Care Home Management;Canalith Repostioning;DME Instruction;Gait training;Stair training;Functional mobility training;Neuromuscular re-education;Balance training;Therapeutic exercise;Therapeutic activities;Patient/family education;Dry needling;Passive range of motion;Vestibular;Aquatic Therapy;Moist Heat;Cryotherapy;Manual techniques    PT Next Visit Plan STG due 8/10; standing balance with decreasing UE support, SLS activities, WB for ataxia/coordination and gait/stair training; walking against resistance of sports cord or theraband in various directions.  Ankle and hip strategy training.  TDN if needed again to paraspinals, upper trap    Consulted and Agree with Plan of Care Patient           Patient will benefit from skilled therapeutic intervention  in order to improve the following deficits and impairments:  Abnormal gait, Decreased activity tolerance, Decreased balance, Decreased coordination, Difficulty walking, Dizziness, Pain, Decreased mobility, Decreased range of motion  Visit Diagnosis: Cervicalgia  Other symptoms and signs involving the nervous system  Dizziness and giddiness  Unsteadiness on feet  Muscle weakness (generalized)  Other abnormalities of gait and mobility     Problem List Patient Active Problem List   Diagnosis Date Noted  . Vitamin D deficiency 01/26/2020  . B12 deficiency 01/25/2020  . Anxiety and depression 01/19/2020  . Alcohol use 01/19/2020  . Concussion with loss of consciousness 01/03/2020  . Thyroid nodule 01/03/2020  . Irregular menses 04/28/2019  . Neuropathy 04/28/2019  . Infertility counseling 10/01/2017  . DDD (degenerative disc disease), cervical 11/29/2016  . Hypoglycemia 11/29/2016  . Kidney stone 11/29/2016  . Sinusitis 11/29/2016  . Insomnia 03/08/2016  . Neck pain 03/08/2016  . 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

## 2020-06-12 ENCOUNTER — Ambulatory Visit: Payer: 59 | Admitting: Speech Pathology

## 2020-06-12 ENCOUNTER — Other Ambulatory Visit: Payer: Self-pay

## 2020-06-12 DIAGNOSIS — R41841 Cognitive communication deficit: Secondary | ICD-10-CM

## 2020-06-12 NOTE — Patient Instructions (Addendum)
   Your anxiety and stress are affecting your memory and attention   You have internal distractions and interfere with you being mindful in tasks therefore you don't pay attention while you are driving, eating, washing   Breathe2Relax  App or guided meditation on YouTube app   Great job recognizing that your physical body and gait are affected by stressors, as well as your memory and attention  Word finding is also affected by stress

## 2020-06-12 NOTE — Therapy (Signed)
[facility] [address] [place], [place], [address] Phone: [phone]   Fax:  [phone]  Speech Language Pathology Treatment  Patient Details  Name: Jane Hendricks MRN: [id number] Date of Birth: 01-14-1981 Referring Provider (SLP): Jarold Motto PA   Encounter Date: 06/12/2020   End of Session - 06/12/20 1514    Visit Number 5    Number of Visits 17    Date for SLP Re-Evaluation 07/13/20    Authorization Type 60 combined - at eval PT has used 35, their plan is for 55 - will need to coordinate with them    Authorization - Visit Number 40   PT/ST combined   Authorization - Number of Visits 60    SLP Start Time 1235    SLP Stop Time  1317    SLP Time Calculation (min) 42 min    Activity Tolerance Patient tolerated treatment well           Past Medical History:  Diagnosis Date  . Ataxic gait   . Compression fracture of C-spine (HCC) 12/12/2019   C4-C5  . GERD (gastroesophageal reflux disease)   . Herniated disc, cervical   . Hypoglycemia   . Migraines     Past Surgical History:  Procedure Laterality Date  . ANKLE SURGERY    . DILATION AND CURETTAGE OF UTERUS    . WISDOM TOOTH EXTRACTION      There were no vitals filed for this visit.   Subjective Assessment - 06/12/20 1237    Subjective "I went to the farmer's market yesterday"    Currently in Pain? Yes    Pain Score 3     Pain Location Shoulder    Pain Orientation Left    Pain Descriptors / Indicators Sore    Pain Type Chronic pain    Pain Onset More than a month ago    Pain Frequency Once a week                 ADULT SLP TREATMENT - 06/12/20 1240      General Information   Behavior/Cognition Alert;Cooperative;Pleasant mood      Treatment Provided   Treatment provided Cognitive-Linquistic      Cognitive-Linquistic Treatment   Treatment focused on Cognition;Patient/family/caregiver education    Skilled Treatment [name]  recalled weekend activity in detail of trip to [employer], cost of flowers.  She is making some adjustments at work to leave earlier when she can. Jane Hendricks verbalized that her gait gets worse when she has high stressors. She reports successful use of notes and repeating and note taking during meetings and phone calls at work. She is using lists and notes for work tasks and has not forgotten any tasks. She is using synoyms duirng word finding episodes with success.       Assessment / Recommendations / Plan   Plan --   reduce to 1x a week     Progression Toward Goals   Progression toward goals Progressing toward goals              SLP Short Term Goals - 06/12/20 1513      SLP SHORT TERM GOAL #1   Title Pt will carryover 3 compensatory strategies to recall and complete tasks at work with rare min A over 2 sessions    Baseline 06-06-20   "I don't answer texts or calls about work after 8 pm anymore."   Time 2    Period Weeks  Status Achieved      SLP SHORT TERM GOAL #2   Title Pt will utilize compensations for word finding diffiuclty as needed over 20 minute conversation with rare min A    Time 2    Period Weeks    Status Achieved      SLP SHORT TERM GOAL #3   Title Pt will utilize 2 compensations to attend to, process and recall phone calls and conversations at work    Time 2    Period Weeks    Status Achieved            SLP Long Term Goals - 06/12/20 1514      SLP LONG TERM GOAL #1   Title Pt will report 3 or less incomplete tasks (due to attention, recall or lack of initiatoin) at work over a work week    Baseline 06/12/20;    Time 5    Period Weeks    Status On-going      SLP LONG TERM GOAL #2   Title Pt will report recalling 90% of conersations and phone calls at work over a work week    Baseline 06/12/20;    Time 5    Period Weeks    Status On-going            Plan - 06/12/20 1316    Clinical Impression Statement Jane Hendricks continues to demonstrate impairments  of memory and attention s/p concussion. At times her symptoms are inconsistent and atypical. Cognitive impairments exacerbated by premorbid anxiety and depression, as well as stress in job and home. Ongoing training in compesnatory strategies for memory, attention and word finding. She is carrying over compensations successfully. Reduce frequency to 1x a week. Pt in agreement.    Speech Therapy Frequency 1x /week    Duration --   8 weeks or 17 visits   Treatment/Interventions Environmental controls;Cognitive reorganization;Compensatory techniques;Functional tasks;Compensatory strategies;Cueing hierarchy;SLP instruction and feedback;Patient/family education;Multimodal communcation approach;Internal/external aids    Potential to Achieve Goals Good           Patient will benefit from skilled therapeutic intervention in order to improve the following deficits and impairments:   Cognitive communication deficit    Problem List Patient Active Problem List   Diagnosis Date Noted  . Vitamin D deficiency 01/26/2020  . B12 deficiency 01/25/2020  . Anxiety and depression 01/19/2020  . Alcohol use 01/19/2020  . Concussion with loss of consciousness 01/03/2020  . Thyroid nodule 01/03/2020  . Irregular menses 04/28/2019  . Neuropathy 04/28/2019  . Infertility counseling 10/01/2017  . DDD (degenerative disc disease), cervical 11/29/2016  . Hypoglycemia 11/29/2016  . Kidney stone 11/29/2016  . Sinusitis 11/29/2016  . Insomnia 03/08/2016  . Neck pain 03/08/2016  . 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 [place], [place], [address] Phone: [phone]   Fax:  [phone]   Name: Jane Hendricks MRN: [id number] Date of Birth: April 08, 1981

## 2020-06-14 ENCOUNTER — Ambulatory Visit: Payer: 59 | Admitting: Physical Therapy

## 2020-06-14 ENCOUNTER — Ambulatory Visit: Payer: 59

## 2020-06-14 ENCOUNTER — Other Ambulatory Visit: Payer: Self-pay

## 2020-06-14 DIAGNOSIS — R41841 Cognitive communication deficit: Secondary | ICD-10-CM | POA: Diagnosis not present

## 2020-06-14 DIAGNOSIS — M6281 Muscle weakness (generalized): Secondary | ICD-10-CM

## 2020-06-14 DIAGNOSIS — R2681 Unsteadiness on feet: Secondary | ICD-10-CM

## 2020-06-14 DIAGNOSIS — R29818 Other symptoms and signs involving the nervous system: Secondary | ICD-10-CM

## 2020-06-14 NOTE — Therapy (Signed)
[facility] [address] [place], [place], [address] Phone: [phone]   Fax:  [phone]  Physical Therapy Treatment  Patient Details  Name: Jane Hendricks MRN: [id number] Date of Birth: [birth year] Referring Provider (PT): Lynne Leader, MD   Encounter Date: 06/14/2020   PT End of Session - 06/14/20 1720    Visit Number 39    Number of Visits 55    Date for PT Re-Evaluation 07/27/20    Authorization Type [employer] - $40 copay - VL: 60 (PT/OT/ST combined) hard max; each discipline counts as one visit.  Beyond 60 visits pt would be considered self-pay and would have to submit bills to third party payer for reimbursement    Authorization - Visit Number 45   PT/ST combined   Authorization - Number of Visits 60    PT Start Time 1534    PT Stop Time 1618    PT Time Calculation (min) 44 min    Equipment Utilized During Treatment Gait belt    Activity Tolerance Patient tolerated treatment well    Behavior During Therapy WFL for tasks assessed/performed           Past Medical History:  Diagnosis Date  . Ataxic gait   . Compression fracture of C-spine ([facility]) 12/12/2019   C4-C5  . GERD (gastroesophageal reflux disease)   . Herniated disc, cervical   . Hypoglycemia   . Migraines     Past Surgical History:  Procedure Laterality Date  . ANKLE SURGERY    . DILATION AND CURETTAGE OF UTERUS    . WISDOM TOOTH EXTRACTION      There were no vitals filed for this visit.   Subjective Assessment - 06/14/20 1537    Subjective [place] No falls. Did great after the dry needling last time. Got very dizzy when she was having to input information in the computer - was having to do a lot of looking up and down.    Pertinent History PMH: hx of herniated disc in cervical spine    Diagnostic tests MRI 2/13 cervical spine: No marrow edema to suggest acute cervical spine fracture. Cervical spondylosis  as outlined. No more than mild spinal canalstenosis at any level. Multilevel neural foraminal narrowinggreatest on the left at C4-C5 and bilaterally at C5-C6(moderate/advanced at these sites).MRI brain: Normal MRI appearance of the brain for age. No evidence of acuteintracranial abnormality    Patient Stated Goals be able to walk and drive again to be more independent; to play golf and cornhole again    Currently in Pain? Yes    Pain Score 2     Pain Location Neck    Pain Orientation Left    Pain Descriptors / Indicators Sore    Aggravating Factors  looking down, being in one position for a long period of time    Pain Relieving Factors massage chair                06/14/20 0001  AROM  AROM Assessment Site Cervical  Cervical Flexion 35  Cervical Extension 45 (after 2nd rep)  Cervical - Right Side Bend 35 (pain/discomfort on L side)  Cervical - Left Side Bend 30 (pain/discomfort on L side)  Cervical - Right Rotation 53  Cervical - Left Rotation 45  Functional Gait  Assessment  Gait assessed  Yes  Gait Level Surface 1 (9.59 seconds)  Change in Gait Speed 2  Gait with Horizontal Head Turns 1  Gait with Vertical Head Turns 1  Gait and Pivot Turn 2  Step Over Obstacle 3  Gait with Narrow Base of Support 0  Gait with Eyes Closed 1  Ambulating Backwards 2  Steps 1  Total Score 14  FGA comment: 14/30        Access Code: [email or web address] URL: [email or web address] Date: 06/14/2020 Prepared by: Janann August  Reviewed/upgraded HEP:   Exercises Seated Cervical Rotation AROM - 1 x daily - 7 x weekly - 4-5 reps - 4 sets Seated Cervical Sidebending AROM - 1 x daily - 7 x weekly - 4-5 reps - 4 sets Sit to Stand with Arm Reach Toward Target - 2 x daily - 7 x weekly - 2 sets - 5 reps - reviewed technique as pt reports not currently performing at home, pt able to perform 3 reps before demonstrating incr ataxic/uncoordinated movement  Seated Gaze Stabilization with  Head Rotation - 2 x daily - 7 x weekly - 2 sets - 30 seconds hold Seated Gaze Stabilization with Head Nod - 2 x daily - 7 x weekly - 2 sets - 30 seconds hold Staggered Stance Forward Backward Weight Shift with Counter Support - 1 x daily - 7 x weekly - 2 sets - 10 reps - reviewed at countertop with BUE support and then progressed to single UE support and adding in reciprocal arm swing for full body movement, pt with no LOB, upgraded HEP             [facility] Adult PT Treatment/Exercise - 06/14/20 1607      Transfers   Transfers Sit to Stand;Stand to Sit    Sit to Stand 5: Supervision;With upper extremity assist;From bed    Sit to Stand Details (indicate cue type and reason) with BUE support    Stand to Sit 5: Supervision;With upper extremity assist;To bed      Ambulation/Gait   Ambulation/Gait Yes    Ambulation/Gait Assistance 5: Supervision;6: Modified independent (Device/Increase time)    Ambulation/Gait Assistance Details pt continues to ambulate with decr B arm swing and guarded movement with incr narrow BOS of times, but no LOB while ambulating with therapist around clinic between activities     Ambulation Distance (Feet) 300 Feet    Assistive device None    Gait Pattern Step-through pattern    Ambulation Surface Level;Indoor;Outdoor;Paved    Gait velocity 13.07 seconds = 2.51 ft/sec    Stairs Yes    Stairs Assistance 5: Supervision    Stairs Assistance Details (indicate cue type and reason) ascends with single handrail and alternating pattern, descends with use of one handrail and stepping down sideways in step to pattern    Stair Management Technique Two rails;Alternating pattern;Step to pattern;One rail Left    Number of Stairs 4    Height of Stairs 6    Curb 5: Supervision;4: Min assist    Curb Details (indicate cue type and reason) pt ascending and descending curb sideways x2 reps with supervision with pt taking incr time, attempted descending in forward direction and pt  needing min A to regain balance and HHA                  PT Education - 06/14/20 1719    Education Details progress towards goals, reviewed HEP and upgraded staggered stance weight shifting    Person(s) Educated Patient    Methods Explanation;Demonstration    Comprehension Verbalized understanding;Returned demonstration  PT Short Term Goals - 06/14/20 1601      PT SHORT TERM GOAL #1   Title Pt will demonstrate ability to safely perform land and aquatic HEP with husband's supervision    Baseline reviewed HEP on 06/15/20 on land, pt does not yet have aquatic HEP    Time 6    Period Weeks    Status Partially Met    Target Date 06/13/20      PT SHORT TERM GOAL #2   Title Pt will demonstrate 4 point improvement in FGA with LRAD to indicate decreased falls risk    Baseline 7/30 - 14/30 on 06/14/20, pt with 7 point improvement.    Time 6    Period Weeks    Status Achieved    Target Date 06/13/20      PT SHORT TERM GOAL #3   Title Pt will demonstrate ability to enter/exit pool with ladder with supervision to be able to enter/exit pool at home    Baseline pt reports that there was a leak in her pool and the whole thing has had to be drained    Time 6    Period Weeks    Status Deferred    Target Date 06/13/20      PT SHORT TERM GOAL #4   Title Pt will improve gait speed with LRAD to >/= 1.4 ft/sec    Baseline .97 ft/sec with RW and Min A, 13.07 seconds = 2.51 ft/sec 06/14/20    Time 6    Period Weeks    Status Achieved    Target Date 06/13/20      PT SHORT TERM GOAL #5   Title Pt will improve cervical extension to >/= 30 deg and bilat rotation to 60 deg to improve mobility for driving    Baseline 45 deg extension, 53 R rotation, 45 L rotation    Time 6    Period Weeks    Status Partially Met    Target Date 06/13/20      PT SHORT TERM GOAL #6   Title Pt will demonstrate ability to perform sit to stand and stand pivot transfers with LRAD MOD I    Baseline  sit <> stand with BUE support mod I, did not perform stand pivot, pt no longer using RW    Time 6    Period Weeks    Status Achieved    Target Date 06/13/20             PT Long Term Goals - 05/05/20 1421      PT LONG TERM GOAL #1   Title Pt will be independent with final HEP in order to build upon functional gains made in therapy.  (ALL GOALS DUE 07/28/20)    Time 12    Period Weeks    Status Revised      PT LONG TERM GOAL #2   Title Pt will ambulate 1000' outside over paved and grassy surfaces without AD, independently to be able to return to golfing    Time 12    Period Weeks    Status Revised      PT LONG TERM GOAL #3   Title Pt will improve FGA score to at least a 20/30 to indicate decreased falls risk in the community    Time 12    Period Weeks    Status Revised      PT LONG TERM GOAL #4   Title Pt will improve B cervical rotation AROM to  65 degrees for functional ROM for driving.    Time 12    Period Weeks    Status Revised      PT LONG TERM GOAL #5   Title Pt will demonstrate ability to perform sit <> stand and stand pivot without AD independently    Time 12    Period Weeks    Status New      PT LONG TERM GOAL #6   Title Pt will improve gait speed without AD to 2.5f/sec in order to decr fall risk and improve community mobility.    Time 12    Period Weeks    Status Revised      PT LONG TERM GOAL #7   Title Pt will demonstrate decreased motion sensitivity as indicated by ability to perform bending down to the floor, performing head turns/nods and body turns without any dizziness; pt will also improve use of vestibular system as indicated by ability to maintain 30 seconds on each condition of the MCTSIB.    Baseline see Vestibular tab    Time 12    Period Weeks    Status On-going      PT LONG TERM GOAL #8   Title Pt will demonstrate ability to stand without support x 10 minutes to play corn hole and demonstrate ability to hit whiffle golf balls in grass x  10 reps with supervision    Time 12    Period Weeks    Status Revised                 Plan - 06/15/20 1954    Clinical Impression Statement Focus of today's skilled session was assessing pt's STGs. Pt has met 3 out of 6 STGs. Pt able to perform sit <> stands with BUE support with mod I and no LOB. 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 Pt improved FGA score from a 7/30 to a 14/30 and able to perform with no AD, however pt continues to be at a high risk for falls. Pt with improved cervical AROM today with less reports of pain, however pt continues to demonstrate decr cervical rotation for driving (53 deg on R, 45 deg on L). Began to review HEP and upgraded staggered stance weight shifting to single UE support at counter with reciprocal arm swing. Pt able to ambulate around clinic with no AD with mod I, but continues to remain guarded with gait and have a narrow BOS. Pt is progressing well, will continue to progress towards LTGs.    Personal Factors and Comorbidities Past/Current Experience;Profession    Examination-Activity Limitations Bend;Carry;Dressing;Locomotion Level;Squat;Stairs;Lift;Stand;Transfers;Hygiene/Grooming;Bathing    Examination-Participation Restrictions Cleaning;Community Activity;Driving;Shop;Laundry    Rehab Potential Good    PT Frequency 2x / week    PT Duration 12 weeks    PT Treatment/Interventions ADLs/Self Care Home Management;Canalith Repostioning;DME Instruction;Gait training;Stair training;Functional mobility training;Neuromuscular re-education;Balance training;Therapeutic exercise;Therapeutic activities;Patient/family education;Dry needling;Passive range of motion;Vestibular;Aquatic Therapy;Moist Heat;Cryotherapy;Manual techniques    PT Next Visit Plan upgrade HEP as appropriate (did not have the chance to today), standing balance with decreasing UE support, SLS activities,  WB for ataxia/coordination and gait/stair and curb training; Ankle and hip strategy training.  TDN if needed again to paraspinals, upper trap    Consulted and Agree with Plan of Care Patient           Patient will benefit from skilled therapeutic intervention in order to improve the  following deficits and impairments:  Abnormal gait, Decreased activity tolerance, Decreased balance, Decreased coordination, Difficulty walking, Dizziness, Pain, Decreased mobility, Decreased range of motion  Visit Diagnosis: Unsteadiness on feet  Muscle weakness (generalized)  Other symptoms and signs involving the nervous system     Problem List Patient Active Problem List   Diagnosis Date Noted  . Vitamin D deficiency 01/26/2020  . B12 deficiency 01/25/2020  . Anxiety and depression 01/19/2020  . Alcohol use 01/19/2020  . Concussion with loss of consciousness 01/03/2020  . Thyroid nodule 01/03/2020  . Irregular menses 04/28/2019  . Neuropathy 04/28/2019  . Infertility counseling 10/01/2017  . DDD (degenerative disc disease), cervical 11/29/2016  . Hypoglycemia 11/29/2016  . Kidney stone 11/29/2016  . Sinusitis 11/29/2016  . Insomnia 03/08/2016  . Neck pain 03/08/2016  . 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

## 2020-06-16 ENCOUNTER — Other Ambulatory Visit (HOSPITAL_COMMUNITY)
Admission: RE | Admit: 2020-06-16 | Discharge: 2020-06-16 | Disposition: A | Discharge status: Home / Self Care | Payer: 59 | Source: Ambulatory Visit | Attending: Physician Assistant | Admitting: Physician Assistant

## 2020-06-16 ENCOUNTER — Other Ambulatory Visit: Payer: Self-pay

## 2020-06-16 ENCOUNTER — Encounter: Payer: Self-pay | Admitting: Physician Assistant

## 2020-06-16 ENCOUNTER — Ambulatory Visit (INDEPENDENT_AMBULATORY_CARE_PROVIDER_SITE_OTHER): Payer: 59 | Admitting: Physician Assistant

## 2020-06-16 VITALS — BP 122/82 | HR 112 | Temp 98.5°F | Ht 65.5 in | Wt 137.0 lb

## 2020-06-16 DIAGNOSIS — E559 Vitamin D deficiency, unspecified: Secondary | ICD-10-CM

## 2020-06-16 DIAGNOSIS — Z124 Encounter for screening for malignant neoplasm of cervix: Secondary | ICD-10-CM | POA: Diagnosis present

## 2020-06-16 DIAGNOSIS — Z0000 Encounter for general adult medical examination without abnormal findings: Secondary | ICD-10-CM | POA: Diagnosis not present

## 2020-06-16 DIAGNOSIS — F419 Anxiety disorder, unspecified: Secondary | ICD-10-CM

## 2020-06-16 DIAGNOSIS — M549 Dorsalgia, unspecified: Secondary | ICD-10-CM

## 2020-06-16 DIAGNOSIS — Z136 Encounter for screening for cardiovascular disorders: Secondary | ICD-10-CM

## 2020-06-16 DIAGNOSIS — F32A Depression, unspecified: Secondary | ICD-10-CM

## 2020-06-16 DIAGNOSIS — Z13220 Encounter for screening for lipoid disorders: Secondary | ICD-10-CM

## 2020-06-16 DIAGNOSIS — Z789 Other specified health status: Secondary | ICD-10-CM

## 2020-06-16 DIAGNOSIS — Z7289 Other problems related to lifestyle: Secondary | ICD-10-CM

## 2020-06-16 DIAGNOSIS — F329 Major depressive disorder, single episode, unspecified: Secondary | ICD-10-CM

## 2020-06-16 DIAGNOSIS — F1090 Alcohol use, unspecified, uncomplicated: Secondary | ICD-10-CM

## 2020-06-16 MED ORDER — CELECOXIB 200 MG PO CAPS: 200.0000 mg | ORAL_CAPSULE | Freq: Every day | ORAL | 1 refills | Status: DC

## 2020-06-16 MED ORDER — CLONAZEPAM 0.5 MG PO TABS: 0.5000 mg | ORAL_TABLET | Freq: Two times a day (BID) | ORAL | 1 refills | Status: DC

## 2020-06-16 MED ORDER — CYCLOBENZAPRINE HCL 10 MG PO TABS: 5.0000 mg | ORAL_TABLET | Freq: Two times a day (BID) | ORAL | 3 refills | Status: DC | PRN

## 2020-06-16 NOTE — Progress Notes (Signed)
I acted as a Neurosurgeon for [employer], PA-C [place], [place]  Subjective:    Jane Hendricks is a 39 y.o. female and is here for a comprehensive physical exam and pap smear.  HPI  Health Maintenance Due  Topic Date Due   Hepatitis C Screening  Never done   COVID-19 Vaccine (1) Never done    Acute Concerns: None  Chronic Issues: Anxiety and depression -- continues to see a psychiatrist and Colen Darling. Currently maintained on buspar 7.5 mg BID, lamictal 50 mg daily, trazodone 100 mg daily, wellbutrin 150 mg extended release daily, klonopin 0.5 mg BID. Denies SI/HI. Neuropathy -- was mostly in her LE. Feels like her symptoms are improving. She is done with B12 injections, last B12 was 510 in June 2021. Vitamin D deficiency -- took high dose vitamin D for multiple weeks and tolerated well, due for Vit D recheck today. Alcohol use -- currently drinking about 2 glasses of wine per night. Denies concerns regarding her alcohol intake. Mid back pain -- ongoing. Continues to see PT. Needs refill of celebrex.  Health Maintenance: Immunizations -- UTD Colonoscopy -- N/A Mammogram -- N/A PAP -- Performed today Bone Density -- N/A Diet -- variable, overall well balanced Sleep habits -- stable; regular use of trazodone Exercise -- attends PT regularly Current Weight -- Weight: 137 lb (62.1 kg)  Weight History: Wt Readings from Last 10 Encounters:  06/16/20 137 lb (62.1 kg)  04/27/20 138 lb (62.6 kg)  04/19/20 138 lb 8 oz (62.8 kg)  04/07/20 135 lb 12.8 oz (61.6 kg)  03/13/20 134 lb 9.6 oz (61.1 kg)  03/06/20 130 lb (59 kg)  02/08/20 131 lb 6.4 oz (59.6 kg)  01/26/20 128 lb (58.1 kg)  01/25/20 129 lb 9.6 oz (58.8 kg)  01/19/20 133 lb (60.3 kg)   Body mass index is 22.45 kg/m. Mood --  See above Patient's last menstrual period was 06/10/2020. Birth control -- none  Other providers/specialists: Patient Care Team: Jarold Motto, [place] as PCP - General (Physician  Assistant) Lynnea Ferrier., MD as Referring Physician (Obstetrics and Gynecology) Rodolph Bong, MD as Consulting Physician (Family Medicine) Thresa Ross, MD as Consulting Physician (Psychiatry) Shelor Earnest Conroy, Minerva Fester, LCSW as Social Worker (Psychology)   PMHx, SurgHx, SocialHx, Medications, and Allergies were reviewed in the Visit Navigator and updated as appropriate.   Past Medical History:  Diagnosis Date   Ataxic gait    Compression fracture of C-spine ([facility]) 12/12/2019   C4-C5   GERD (gastroesophageal reflux disease)    Herniated disc, cervical    Hypoglycemia    Migraines      Past Surgical History:  Procedure Laterality Date   ANKLE SURGERY     DILATION AND CURETTAGE OF UTERUS     WISDOM TOOTH EXTRACTION       Family History  Problem Relation Age of Onset   Osteoarthritis Mother    Hyperlipidemia Mother    Hypertension Mother    Kidney disease Father    Osteoarthritis Maternal Grandmother    COPD Maternal Grandmother    Osteoarthritis Maternal Grandfather    Lung cancer Maternal Grandfather    Diabetes Maternal Grandfather    Hyperlipidemia Maternal Grandfather    Hypertension Maternal Grandfather    Osteoarthritis Paternal Grandmother    COPD Paternal Grandmother    Osteoarthritis Paternal Grandfather    Colon cancer Paternal Grandfather        after age 63   Lung cancer Maternal Aunt  mets to brain   Breast cancer Neg Hx     Social History   Tobacco Use   Smoking status: Never Smoker   Smokeless tobacco: Never Used  Vaping Use   Vaping Use: Never used  Substance Use Topics   Alcohol use: Yes    Alcohol/week: 14.0 standard drinks    Types: 14 Glasses of wine per week   Drug use: No    Review of Systems:   Review of Systems  Constitutional: Negative for chills, fever, malaise/fatigue and weight loss.  HENT: Negative for hearing loss, sinus pain and sore throat.   Respiratory: Negative for cough and  hemoptysis.   Cardiovascular: Negative for chest pain, palpitations, leg swelling and PND.  Gastrointestinal: Negative for abdominal pain, constipation, diarrhea, heartburn, nausea and vomiting.  Genitourinary: Negative for dysuria, frequency and urgency.  Musculoskeletal: Negative for back pain, myalgias and neck pain.  Skin: Negative for itching and rash.  Neurological: Negative for dizziness, tingling, seizures and headaches.  Endo/Heme/Allergies: Negative for polydipsia.  Psychiatric/Behavioral: Positive for depression. The patient is nervous/anxious and has insomnia.     Objective:   BP 122/82 (BP Location: Left Arm, Patient Position: Sitting, Cuff Size: Normal)    Pulse (!) 112    Temp 98.5 F (36.9 C) (Temporal)    Ht 5' 5.5" (1.664 m)    Wt 137 lb (62.1 kg)    LMP 06/10/2020    HC 65.5" (166.4 cm)    SpO2 99%    BMI 22.45 kg/m   General Appearance:    Alert, cooperative, no distress, appears stated age  Head:    Normocephalic, without obvious abnormality, atraumatic  Eyes:    PERRL, conjunctiva/corneas clear, EOM's intact, fundi    benign, both eyes  Ears:    Normal TM's and external ear canals, both ears  Nose:   Nares normal, septum midline, mucosa normal, no drainage    or sinus tenderness  Throat:   Lips, mucosa, and tongue normal; teeth and gums normal  Neck:   Supple, symmetrical, trachea midline, no adenopathy;    thyroid:  no enlargement/tenderness/nodules; no carotid   bruit or JVD  Back:     Symmetric, no curvature, ROM normal, no CVA tenderness  Lungs:     Clear to auscultation bilaterally, respirations unlabored  Chest Wall:    No tenderness or deformity   Heart:    Regular rate and rhythm, S1 and S2 normal, no murmur, rub   or gallop  Breast Exam:    Deferred  Abdomen:     Soft, non-tender, bowel sounds active all four quadrants,    no masses, no organomegaly  Genitalia:    Normal female without lesion, discharge or tenderness  Rectal:    Normal tone no masses  or tenderness  Extremities:   Extremities normal, atraumatic, no cyanosis or edema  Pulses:   2+ and symmetric all extremities  Skin:   Skin color, texture, turgor normal, no rashes or lesions  Lymph nodes:   Cervical, supraclavicular, and axillary nodes normal  Neurologic:   CNII-XII intact, normal strength, sensation and reflexes    throughout     Assessment/Plan:   Genell was seen today for annual exam and gynecologic exam.  Diagnoses and all orders for this visit:  Routine physical examination Today patient counseled on age appropriate routine health concerns for screening and prevention, each reviewed and up to date or declined. Immunizations reviewed and up to date or declined. Labs ordered and reviewed. Risk  factors for depression reviewed and negative. Hearing function and visual acuity are intact. ADLs screened and addressed as needed. Functional ability and level of safety reviewed and appropriate. Education, counseling and referrals performed based on assessed risks today. Patient provided with a copy of personalized plan for preventive services. -     CBC with Differential/Platelet; Future -     Comprehensive metabolic panel; Future -     Comprehensive metabolic panel -     CBC with Differential/Platelet  Vitamin D deficiency Update Vit D lab today and recommend supplementation as appropriate. -     VITAMIN D 25 Hydroxy (Vit-D Deficiency, Fractures); Future -     VITAMIN D 25 Hydroxy (Vit-D Deficiency, Fractures)  Anxiety and depression Management per psychiatry and psychology. I discussed with patient that if they develop any SI, to tell someone immediately and seek medical attention. Follow-up in 3 months to check in.  Alcohol use Alcohol intake is gradually increasing. Counseled. Follow-up in 3 months to check in.  Mid back pain Continue with PT. Refilled celebrex today.  Pap smear for cervical cancer screening -     Cytology - PAP( [place])  Encounter  for lipid screening for cardiovascular disease -     Lipid panel; Future -     Lipid panel  Other orders -     cyclobenzaprine (FLEXERIL) 10 MG tablet; Take 0.5-1 tablets (5-10 mg total) by mouth 2 (two) times daily as needed for muscle spasms. -     celecoxib (CELEBREX) 200 MG capsule; Take 1 capsule (200 mg total) by mouth daily. -     clonazePAM (KLONOPIN) 0.5 MG tablet; Take 1 tablet (0.5 mg total) by mouth 2 (two) times daily.  Well Adult Exam: Labs ordered: Yes. Patient counseling was done. See below for items discussed. Discussed the patient's BMI. The BMI is in the acceptable range Follow up in 3 months.  Patient Counseling:   [x]     Nutrition: Stressed importance of moderation in sodium/caffeine intake, saturated fat and cholesterol, caloric balance, sufficient intake of fresh fruits, vegetables, fiber, calcium, iron, and 1 mg of folate supplement per day (for females capable of pregnancy).   [x]      Stressed the importance of regular exercise.    [x]     Substance Abuse: Discussed cessation/primary prevention of tobacco, alcohol, or other drug use; driving or other dangerous activities under the influence; availability of treatment for abuse.    [x]      Injury prevention: Discussed safety belts, safety helmets, smoke detector, smoking near bedding or upholstery.    [x]      Sexuality: Discussed sexually transmitted diseases, partner selection, use of condoms, avoidance of unintended pregnancy  and contraceptive alternatives.    [x]     Dental health: Discussed importance of regular tooth brushing, flossing, and dental visits.   [x]      Health maintenance and immunizations reviewed. Please refer to Health maintenance section.   CMA or LPN served as scribe during this visit. History, Physical, and Plan performed by medical provider. The above documentation has been reviewed and is accurate and complete.  , PA-C [facility]

## 2020-06-16 NOTE — Patient Instructions (Signed)
It was great to see you!  Hang in there.  Let's follow-up in 3 months to check in.  Please go to the lab for blood work.   Our office will call you with your results unless you have chosen to receive results via MyChart.  If your blood work is normal we will follow-up each year for physicals and as scheduled for chronic medical problems.  If anything is abnormal we will treat accordingly and get you in for a follow-up.  Take care,  [facility] Maintenance, Female Adopting a healthy lifestyle and getting preventive care are important in promoting health and wellness. Ask your health care provider about:  The right schedule for you to have regular tests and exams.  Things you can do on your own to prevent diseases and keep yourself healthy. What should I know about diet, weight, and exercise? Eat a healthy diet   Eat a diet that includes plenty of vegetables, fruits, low-fat dairy products, and lean protein.  Do not eat a lot of foods that are high in solid fats, added sugars, or sodium. Maintain a healthy weight Body mass index (BMI) is used to identify weight problems. It estimates body fat based on height and weight. Your health care provider can help determine your BMI and help you achieve or maintain a healthy weight. Get regular exercise Get regular exercise. This is one of the most important things you can do for your health. Most adults should:  Exercise for at least 150 minutes each week. The exercise should increase your heart rate and make you sweat (moderate-intensity exercise).  Do strengthening exercises at least twice a week. This is in addition to the moderate-intensity exercise.  Spend less time sitting. Even light physical activity can be beneficial. Watch cholesterol and blood lipids Have your blood tested for lipids and cholesterol at 38 years of age, then have this test every 5 years. Have your cholesterol levels checked more often if:  Your  lipid or cholesterol levels are high.  You are older than 38 years of age.  You are at high risk for heart disease. What should I know about cancer screening? Depending on your health history and family history, you may need to have cancer screening at various ages. This may include screening for:  Breast cancer.  Cervical cancer.  Colorectal cancer.  Skin cancer.  Lung cancer. What should I know about heart disease, diabetes, and high blood pressure? Blood pressure and heart disease  High blood pressure causes heart disease and increases the risk of stroke. This is more likely to develop in people who have high blood pressure readings, are of African descent, or are overweight.  Have your blood pressure checked: ? Every 3-5 years if you are 20-5 years of age. ? Every year if you are 16 years old or older. Diabetes Have regular diabetes screenings. This checks your fasting blood sugar level. Have the screening done:  Once every three years after age 95 if you are at a normal weight and have a low risk for diabetes.  More often and at a younger age if you are overweight or have a high risk for diabetes. What should I know about preventing infection? Hepatitis B If you have a higher risk for hepatitis B, you should be screened for this virus. Talk with your health care provider to find out if you are at risk for hepatitis B infection. Hepatitis C Testing is recommended for:  Everyone born from  1945 through [birth year].  Anyone with known risk factors for hepatitis C. Sexually transmitted infections (STIs)  Get screened for STIs, including gonorrhea and chlamydia, if: ? You are sexually active and are younger than 38 years of age. ? You are older than 38 years of age and your health care provider tells you that you are at risk for this type of infection. ? Your sexual activity has changed since you were last screened, and you are at increased risk for chlamydia or gonorrhea. Ask  your health care provider if you are at risk.  Ask your health care provider about whether you are at high risk for HIV. Your health care provider may recommend a prescription medicine to help prevent HIV infection. If you choose to take medicine to prevent HIV, you should first get tested for HIV. You should then be tested every 3 months for as long as you are taking the medicine. Pregnancy  If you are about to stop having your period (premenopausal) and you may become pregnant, seek counseling before you get pregnant.  Take 400 to 800 micrograms (mcg) of folic acid every day if you become pregnant.  Ask for birth control (contraception) if you want to prevent pregnancy. Osteoporosis and menopause Osteoporosis is a disease in which the bones lose minerals and strength with aging. This can result in bone fractures. If you are 18 years old or older, or if you are at risk for osteoporosis and fractures, ask your health care provider if you should:  Be screened for bone loss.  Take a calcium or vitamin D supplement to lower your risk of fractures.  Be given hormone replacement therapy (HRT) to treat symptoms of menopause. Follow these instructions at home: Lifestyle  Do not use any products that contain nicotine or tobacco, such as cigarettes, e-cigarettes, and chewing tobacco. If you need help quitting, ask your health care provider.  Do not use street drugs.  Do not share needles.  Ask your health care provider for help if you need support or information about quitting drugs. Alcohol use  Do not drink alcohol if: ? Your health care provider tells you not to drink. ? You are pregnant, may be pregnant, or are planning to become pregnant.  If you drink alcohol: ? Limit how much you use to 0-1 drink a day. ? Limit intake if you are breastfeeding.  Be aware of how much alcohol is in your drink. In the [place] one drink equals one 12 oz bottle of beer (355 mL), one 5 oz glass of wine  (148 mL), or one 1 oz glass of hard liquor (44 mL). General instructions  Schedule regular health, dental, and eye exams.  Stay current with your vaccines.  Tell your health care provider if: ? You often feel depressed. ? You have ever been abused or do not feel safe at home. Summary  Adopting a healthy lifestyle and getting preventive care are important in promoting health and wellness.  Follow your health care provider's instructions about healthy diet, exercising, and getting tested or screened for diseases.  Follow your health care provider's instructions on monitoring your cholesterol and blood pressure. This information is not intended to replace advice given to you by your health care provider. Make sure you discuss any questions you have with your health care provider. Document Revised: 10/14/2018 Document Reviewed: 10/14/2018 Elsevier Patient Education  2020 [employer].

## 2020-06-17 LAB — COMPREHENSIVE METABOLIC PANEL
AG Ratio: 1.3 (calc) (ref 1.0–2.5)
ALT: 33 U/L — ABNORMAL HIGH (ref 6–29)
AST: 44 U/L — ABNORMAL HIGH (ref 10–30)
Albumin: 3.8 g/dL (ref 3.6–5.1)
Alkaline phosphatase (APISO): 96 U/L (ref 31–125)
BUN: 15 mg/dL (ref 7–25)
CO2: 28 mmol/L (ref 20–32)
Calcium: 9.1 mg/dL (ref 8.6–10.2)
Chloride: 107 mmol/L (ref 98–110)
Creat: 0.72 mg/dL (ref 0.50–1.10)
Globulin: 3 g/dL (calc) (ref 1.9–3.7)
Glucose, Bld: 83 mg/dL (ref 65–99)
Potassium: 3.9 mmol/L (ref 3.5–5.3)
Sodium: 141 mmol/L (ref 135–146)
Total Bilirubin: 0.4 mg/dL (ref 0.2–1.2)
Total Protein: 6.8 g/dL (ref 6.1–8.1)

## 2020-06-17 LAB — CBC WITH DIFFERENTIAL/PLATELET
Absolute Monocytes: 659 cells/uL (ref 200–950)
Basophils Absolute: 49 cells/uL (ref 0–200)
Basophils Relative: 0.9 %
Eosinophils Absolute: 22 cells/uL (ref 15–500)
Eosinophils Relative: 0.4 %
HCT: 35.4 % (ref 35.0–45.0)
Hemoglobin: 11.6 g/dL — ABNORMAL LOW (ref 11.7–15.5)
Lymphs Abs: 1507 cells/uL (ref 850–3900)
MCH: 36.5 pg — ABNORMAL HIGH (ref 27.0–33.0)
MCHC: 32.8 g/dL (ref 32.0–36.0)
MCV: 111.3 fL — ABNORMAL HIGH (ref 80.0–100.0)
MPV: 9.5 fL (ref 7.5–12.5)
Monocytes Relative: 12.2 %
Neutro Abs: 3164 cells/uL (ref 1500–7800)
Neutrophils Relative %: 58.6 %
Platelets: 251 10*3/uL (ref 140–400)
RBC: 3.18 10*6/uL — ABNORMAL LOW (ref 3.80–5.10)
RDW: 13.9 % (ref 11.0–15.0)
Total Lymphocyte: 27.9 %
WBC: 5.4 10*3/uL (ref 3.8–10.8)

## 2020-06-17 LAB — LIPID PANEL
Cholesterol: 159 mg/dL (ref <200)
HDL: 67 mg/dL (ref >=50)
LDL Cholesterol (Calc): 60 mg/dL (calc)
Non-HDL Cholesterol (Calc): 92 mg/dL (calc) (ref <130)
Total CHOL/HDL Ratio: 2.4 (calc) (ref <5.0)
Triglycerides: 276 mg/dL — ABNORMAL HIGH (ref <150)

## 2020-06-17 LAB — VITAMIN D 25 HYDROXY (VIT D DEFICIENCY, FRACTURES): Vit D, 25-Hydroxy: 96 ng/mL (ref 30–100)

## 2020-06-19 ENCOUNTER — Ambulatory Visit: Payer: 59 | Admitting: Speech Pathology

## 2020-06-20 ENCOUNTER — Telehealth (HOSPITAL_COMMUNITY): Payer: 59 | Admitting: Psychiatry

## 2020-06-20 LAB — CYTOLOGY - PAP
Comment: NEGATIVE
Diagnosis: NEGATIVE
High risk HPV: NEGATIVE

## 2020-06-21 ENCOUNTER — Ambulatory Visit: Payer: 59

## 2020-06-21 ENCOUNTER — Other Ambulatory Visit: Payer: Self-pay | Admitting: Physician Assistant

## 2020-06-21 ENCOUNTER — Encounter: Payer: Self-pay | Admitting: Family Medicine

## 2020-06-21 ENCOUNTER — Encounter: Payer: Self-pay | Admitting: Physical Therapy

## 2020-06-21 ENCOUNTER — Ambulatory Visit: Payer: 59 | Admitting: Physical Therapy

## 2020-06-21 ENCOUNTER — Other Ambulatory Visit: Payer: Self-pay

## 2020-06-21 DIAGNOSIS — R29818 Other symptoms and signs involving the nervous system: Secondary | ICD-10-CM

## 2020-06-21 DIAGNOSIS — R2681 Unsteadiness on feet: Secondary | ICD-10-CM

## 2020-06-21 DIAGNOSIS — M542 Cervicalgia: Secondary | ICD-10-CM

## 2020-06-21 DIAGNOSIS — R7989 Other specified abnormal findings of blood chemistry: Secondary | ICD-10-CM

## 2020-06-21 DIAGNOSIS — R41841 Cognitive communication deficit: Secondary | ICD-10-CM | POA: Diagnosis not present

## 2020-06-21 NOTE — Therapy (Signed)
[facility] [address] [place], [place], [address] Phone: [phone]   Fax:  [phone]  Physical Therapy Treatment  Patient Details  Name: Jane Hendricks MRN: [id number] Date of Birth: Sep 08, 1981 Referring Provider (PT): Lynne Leader, MD   Encounter Date: 06/21/2020   PT End of Session - 06/21/20 1602    Visit Number 40    Number of Visits 55    Date for PT Re-Evaluation 07/27/20    Authorization Type [employer] - $40 copay - VL: 60 (PT/OT/ST combined) hard max; each discipline counts as one visit.  Beyond 60 visits pt would be considered self-pay and would have to submit bills to third party payer for reimbursement    Authorization - Visit Number 47   PT/ST combined   Authorization - Number of Visits 60    PT Start Time 1448    PT Stop Time 1535    PT Time Calculation (min) 47 min    Activity Tolerance Patient tolerated treatment well    Behavior During Therapy WFL for tasks assessed/performed           Past Medical History:  Diagnosis Date  . Ataxic gait   . Compression fracture of C-spine ([place]) 12/12/2019   C4-C5  . GERD (gastroesophageal reflux disease)   . Herniated disc, cervical   . Hypoglycemia   . Migraines     Past Surgical History:  Procedure Laterality Date  . ANKLE SURGERY    . DILATION AND CURETTAGE OF UTERUS    . WISDOM TOOTH EXTRACTION      There were no vitals filed for this visit.   Subjective Assessment - 06/21/20 1504    Subjective Her job was terminated.  Under a lot of stress.  Is looking at other jobs but is filing unemployment.    Pertinent History PMH: hx of herniated disc in cervical spine    Diagnostic tests MRI 2/13 cervical spine: No marrow edema to suggest acute cervical spine fracture. Cervical spondylosis as outlined. No more than mild spinal canalstenosis at any level. Multilevel neural foraminal narrowinggreatest on the left at C4-C5 and bilaterally at  C5-C6(moderate/advanced at these sites).MRI brain: Normal MRI appearance of the brain for age. No evidence of acuteintracranial abnormality    Patient Stated Goals be able to walk and drive again to be more independent; to play golf and cornhole again    Currently in Pain? No/denies                             [facility] Adult PT Treatment/Exercise - 06/21/20 1550      Transfers   Transfers Sit to Stand;Stand to Sit    Sit to Stand 5: Supervision    Sit to Stand Details (indicate cue type and reason) still increased reliance on UE to stabilize, wide BOS    Stand to Sit 6: Modified independent (Device/Increase time)      Ambulation/Gait   Ambulation/Gait Yes    Ambulation/Gait Assistance 5: Supervision    Ambulation/Gait Assistance Details continues to ambulate with wide BOS and intermittently touch walls or furniture    Ambulation Distance (Feet) 200 Feet    Assistive device None    Gait Pattern Step-through pattern;Wide base of support    Ambulation Surface Level;Indoor      Self-Care   Self-Care Other Self-Care Comments    Other Self-Care Comments  Allowed pt to describe work situation and offered pt emotional support.  Discussed with pt if she had any financial concerns about continuing with therapy; pt does not as she is still under her husband's COBRA insurance.  Pt also asking therapist if she would be appropriate to apply for disability.  Deferred question to physician but did explain the purpose of disability.  Pt does not feel that her diagnoses limit or keep her from working but states she is "exploring all options for financial support."  Encouraged pt to discuss further with physician.      Manual Therapy   Manual Therapy Joint mobilization;Passive ROM;Manual Traction    Manual therapy comments Performed in supine after TDN    Joint Mobilization Performed grade II/III upglides to R side and downglides to L side with neck rotated to L side for mobilization with  movement to facilitate increased rotation to L; performed to upper, middle and lower cervical spine    Passive ROM Performed to L and then R upper trap in supine after TDN; reported still a slight pinch on L side with sidebending to L but ROM significantly improved    Manual Traction in supine after TDN, PROM and joint mobilization            Trigger Point Dry Needling - 06/21/20 1558    Consent Given? Yes    Muscles Treated Head and Neck Upper trapezius    Dry Needling Comments Performed in supine to L side only    Upper Trapezius Response Twitch reponse elicited;Palpable increased muscle length                  PT Short Term Goals - 06/14/20 1601      PT SHORT TERM GOAL #1   Title Pt will demonstrate ability to safely perform land and aquatic HEP with husband's supervision    Baseline reviewed HEP on 06/15/20 on land, pt does not yet have aquatic HEP    Time 6    Period Weeks    Status Partially Met    Target Date 06/13/20      PT SHORT TERM GOAL #2   Title Pt will demonstrate 4 point improvement in FGA with LRAD to indicate decreased falls risk    Baseline 7/30 - 14/30 on 06/14/20, pt with 7 point improvement.    Time 6    Period Weeks    Status Achieved    Target Date 06/13/20      PT SHORT TERM GOAL #3   Title Pt will demonstrate ability to enter/exit pool with ladder with supervision to be able to enter/exit pool at home    Baseline pt reports that there was a leak in her pool and the whole thing has had to be drained    Time 6    Period Weeks    Status Deferred    Target Date 06/13/20      PT SHORT TERM GOAL #4   Title Pt will improve gait speed with LRAD to >/= 1.4 ft/sec    Baseline .8 ft/sec with RW and Min A, 13.07 seconds = 2.51 ft/sec 06/14/20    Time 6    Period Weeks    Status Achieved    Target Date 06/13/20      PT SHORT TERM GOAL #5   Title Pt will improve cervical extension to >/= 30 deg and bilat rotation to 60 deg to improve mobility for  driving    Baseline 45 deg extension, 53 R rotation, 45 L rotation    Time 6  Period Weeks    Status Partially Met    Target Date 06/13/20      PT SHORT TERM GOAL #6   Title Pt will demonstrate ability to perform sit to stand and stand pivot transfers with LRAD MOD I    Baseline sit <> stand with BUE support mod I, did not perform stand pivot, pt no longer using RW    Time 6    Period Weeks    Status Achieved    Target Date 06/13/20             PT Long Term Goals - 05/05/20 1421      PT LONG TERM GOAL #1   Title Pt will be independent with final HEP in order to build upon functional gains made in therapy.  (ALL GOALS DUE 07/28/20)    Time 12    Period Weeks    Status Revised      PT LONG TERM GOAL #2   Title Pt will ambulate 1000' outside over paved and grassy surfaces without AD, independently to be able to return to golfing    Time 12    Period Weeks    Status Revised      PT LONG TERM GOAL #3   Title Pt will improve FGA score to at least a 20/30 to indicate decreased falls risk in the community    Time 12    Period Weeks    Status Revised      PT LONG TERM GOAL #4   Title Pt will improve B cervical rotation AROM to 65 degrees for functional ROM for driving.    Time 12    Period Weeks    Status Revised      PT LONG TERM GOAL #5   Title Pt will demonstrate ability to perform sit <> stand and stand pivot without AD independently    Time 12    Period Weeks    Status New      PT LONG TERM GOAL #6   Title Pt will improve gait speed without AD to 2.12f/sec in order to decr fall risk and improve community mobility.    Time 12    Period Weeks    Status Revised      PT LONG TERM GOAL #7   Title Pt will demonstrate decreased motion sensitivity as indicated by ability to perform bending down to the floor, performing head turns/nods and body turns without any dizziness; pt will also improve use of vestibular system as indicated by ability to maintain 30 seconds on  each condition of the MCTSIB.    Baseline see Vestibular tab    Time 12    Period Weeks    Status On-going      PT LONG TERM GOAL #8   Title Pt will demonstrate ability to stand without support x 10 minutes to play corn hole and demonstrate ability to hit whiffle golf balls in grass x 10 reps with supervision    Time 12    Period Weeks    Status Revised                 Plan - 06/21/20 1603    Clinical Impression Statement Patient reporting increased stress/tension today after learning her job was terminated earlier this week.  Based on STG assessment pt continuing to demonstrate decreased neck ROM to L side for driving.  Addressed with TDN and manual therapy with pt demonstrating significant increase in functional ROM in supine but  did not have time to remeasure.  Pt tolerated well with no HA or dizziness.  Will continue to progress HEP next session.    Personal Factors and Comorbidities Past/Current Experience;Profession    Examination-Activity Limitations Bend;Carry;Dressing;Locomotion Level;Squat;Stairs;Lift;Stand;Transfers;Hygiene/Grooming;Bathing    Examination-Participation Restrictions Cleaning;Community Activity;Driving;Shop;Laundry    Rehab Potential Good    PT Frequency 2x / week    PT Duration 12 weeks    PT Treatment/Interventions ADLs/Self Care Home Management;Canalith Repostioning;DME Instruction;Gait training;Stair training;Functional mobility training;Neuromuscular re-education;Balance training;Therapeutic exercise;Therapeutic activities;Patient/family education;Dry needling;Passive range of motion;Vestibular;Aquatic Therapy;Moist Heat;Cryotherapy;Manual techniques    PT Next Visit Plan Lost her job.   Continue to review and upgraded HEP as indicated.  Standing balance with decreased UE support.  Higher level gait training.    Consulted and Agree with Plan of Care Patient           Patient will benefit from skilled therapeutic intervention in order to improve the  following deficits and impairments:  Abnormal gait, Decreased activity tolerance, Decreased balance, Decreased coordination, Difficulty walking, Dizziness, Pain, Decreased mobility, Decreased range of motion  Visit Diagnosis: Cervicalgia  Unsteadiness on feet  Other symptoms and signs involving the nervous system     Problem List Patient Active Problem List   Diagnosis Date Noted  . Vitamin D deficiency 01/26/2020  . B12 deficiency 01/25/2020  . Anxiety and depression 01/19/2020  . Alcohol use 01/19/2020  . Concussion with loss of consciousness 01/03/2020  . Thyroid nodule 01/03/2020  . Irregular menses 04/28/2019  . Neuropathy 04/28/2019  . Infertility counseling 10/01/2017  . DDD (degenerative disc disease), cervical 11/29/2016  . Hypoglycemia 11/29/2016  . Kidney stone 11/29/2016  . Sinusitis 11/29/2016  . Insomnia 03/08/2016  . Neck pain 03/08/2016  . 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 [address] [place], [place], [address] Phone: [phone]   Fax:  [phone]  Name: Jane Hendricks MRN: [id number] Date of Birth: August 20, 1981

## 2020-06-21 NOTE — Therapy (Signed)
[facility] [address] [place], [place], [address] Phone: [phone]   Fax:  [phone]  Speech Language Pathology Treatment  Patient Details  Name: Jane Hendricks MRN: [id number] Date of Birth: 08-01-1981 Referring Provider (SLP): Jarold Motto PA   Encounter Date: 06/21/2020   End of Session - 06/21/20 1720    Visit Number 6    Number of Visits 17    Date for SLP Re-Evaluation 07/13/20    Authorization Type 60 combined - at eval PT has used 35, their plan is for 55 - will need to coordinate with them    Authorization - Visit Number 40   PT/ST combined   Authorization - Number of Visits 60    SLP Start Time 1536    SLP Stop Time  1615    SLP Time Calculation (min) 39 min    Activity Tolerance Patient tolerated treatment well           Past Medical History:  Diagnosis Date  . Ataxic gait   . Compression fracture of C-spine ([facility]) 12/12/2019   C4-C5  . GERD (gastroesophageal reflux disease)   . Herniated disc, cervical   . Hypoglycemia   . Migraines     Past Surgical History:  Procedure Laterality Date  . ANKLE SURGERY    . DILATION AND CURETTAGE OF UTERUS    . WISDOM TOOTH EXTRACTION      There were no vitals filed for this visit.   Subjective Assessment - 06/21/20 1540    Subjective "I lost my job yesterday."    Currently in Pain? No/denies                 ADULT SLP TREATMENT - 06/21/20 1544      General Information   Behavior/Cognition Alert;Cooperative;Pleasant mood      Treatment Provided   Treatment provided Cognitive-Linquistic      Cognitive-Linquistic Treatment   Treatment focused on Cognition;Patient/family/caregiver education    Skilled Treatment Pt recalled details from last few days with detail. Pt continues to use compensations for memory (notes, repeating). SLP asked pt what she would do to stay organized - print out and make files for each job opportunity. SLP  told pt to work each day at planning a timeframe to organize her 'piles/bags" of things. Pt agreed decr to x1/week was appropriate and that final session would be/could be in two more visits.      Assessment / Recommendations / Plan   Plan Continue with current plan of care   likely d/c in 1-2 visits     Progression Toward Goals   Progression toward goals Progressing toward goals              SLP Short Term Goals - 06/21/20 1722      SLP SHORT TERM GOAL #1   Title Pt will carryover 3 compensatory strategies to recall and complete tasks at work with rare min A over 2 sessions    Baseline 06-06-20   "I don't answer texts or calls about work after 8 pm anymore."   Status Achieved      SLP SHORT TERM GOAL #2   Title Pt will utilize compensations for word finding diffiuclty as needed over 20 minute conversation with rare min A    Status Achieved      SLP SHORT TERM GOAL #3   Title Pt will utilize 2 compensations to attend to, process and recall phone calls and conversations at work  Status Achieved            SLP Long Term Goals - 06/21/20 1723      SLP LONG TERM GOAL #1   Title Pt will report 3 or less incomplete tasks (due to attention, recall or lack of initiatoin) at work or home over a work week    Baseline 06/12/20; 06-21-20    Time 4    Period Weeks    Status Revised      SLP LONG TERM GOAL #2   Title Pt will report recalling 90% of conersations and phone calls at work over a work week    Baseline 06/12/20    Status Achieved            Plan - 06/21/20 1720    Clinical Impression Statement Jenascia continues to demonstrate impairments of memory and attention s/p concussion. At times her symptoms are inconsistent and atypical. Cognitive impairments exacerbated by premorbid anxiety and depression, as well as stress in job and home. Pt described her cont'd successful usage of compesnatory strategies for memory, attention and word finding. Reduce frequency to 1x a week with  likely d/c in 1-2 visits. Pt in agreement.    Speech Therapy Frequency 1x /week    Duration --   8 weeks or 17 visits   Treatment/Interventions Environmental controls;Cognitive reorganization;Compensatory techniques;Functional tasks;Compensatory strategies;Cueing hierarchy;SLP instruction and feedback;Patient/family education;Multimodal communcation approach;Internal/external aids    Potential to Achieve Goals Good           Patient will benefit from skilled therapeutic intervention in order to improve the following deficits and impairments:   Cognitive communication deficit    Problem List Patient Active Problem List   Diagnosis Date Noted  . Vitamin D deficiency 01/26/2020  . B12 deficiency 01/25/2020  . Anxiety and depression 01/19/2020  . Alcohol use 01/19/2020  . Concussion with loss of consciousness 01/03/2020  . Thyroid nodule 01/03/2020  . Irregular menses 04/28/2019  . Neuropathy 04/28/2019  . Infertility counseling 10/01/2017  . DDD (degenerative disc disease), cervical 11/29/2016  . Hypoglycemia 11/29/2016  . Kidney stone 11/29/2016  . Sinusitis 11/29/2016  . Insomnia 03/08/2016  . Neck pain 03/08/2016  . 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

## 2020-06-21 NOTE — Patient Instructions (Signed)
Access Code: [email or web address] URL: [email or web address] Date: 05/05/2020 Prepared by: Bufford Lope

## 2020-06-21 NOTE — Patient Instructions (Signed)
° °  Work at Museum/gallery exhibitions officer, the office stuff, etc.   Make a specific time to do this each weekday until I see you again.

## 2020-06-26 ENCOUNTER — Ambulatory Visit: Payer: 59 | Admitting: Speech Pathology

## 2020-06-26 ENCOUNTER — Encounter: Payer: Self-pay | Admitting: Family Medicine

## 2020-06-26 ENCOUNTER — Other Ambulatory Visit (HOSPITAL_COMMUNITY): Payer: Self-pay | Admitting: Psychiatry

## 2020-06-28 ENCOUNTER — Ambulatory Visit: Payer: 59

## 2020-06-28 ENCOUNTER — Ambulatory Visit: Payer: 59 | Admitting: Physical Therapy

## 2020-06-28 ENCOUNTER — Other Ambulatory Visit: Payer: Self-pay

## 2020-06-28 DIAGNOSIS — R41841 Cognitive communication deficit: Secondary | ICD-10-CM | POA: Diagnosis not present

## 2020-06-28 DIAGNOSIS — M6281 Muscle weakness (generalized): Secondary | ICD-10-CM

## 2020-06-28 DIAGNOSIS — R29818 Other symptoms and signs involving the nervous system: Secondary | ICD-10-CM

## 2020-06-28 DIAGNOSIS — R42 Dizziness and giddiness: Secondary | ICD-10-CM

## 2020-06-28 DIAGNOSIS — R2681 Unsteadiness on feet: Secondary | ICD-10-CM

## 2020-06-28 NOTE — Therapy (Signed)
[facility] [address] [place], [place], [address] Phone: [phone]   Fax:  [phone]  Physical Therapy Treatment  Patient Details  Name: Jane Hendricks MRN: [id number] Date of Birth: 09/28/1981 Referring Provider (PT): Lynne Leader, MD   Encounter Date: 06/28/2020   PT End of Session - 06/28/20 1721    Visit Number 41    Number of Visits 55    Date for PT Re-Evaluation 07/27/20    Authorization Type [employer] - $40 copay - VL: 60 (PT/OT/ST combined) hard max; each discipline counts as one visit.  Beyond 60 visits pt would be considered self-pay and would have to submit bills to third party payer for reimbursement    Authorization - Visit Number 49   PT/ST combined   Authorization - Number of Visits 60    PT Start Time 1531    PT Stop Time 1616    PT Time Calculation (min) 45 min    Equipment Utilized During Treatment Gait belt    Activity Tolerance Patient tolerated treatment well    Behavior During Therapy WFL for tasks assessed/performed           Past Medical History:  Diagnosis Date  . Ataxic gait   . Compression fracture of C-spine ([place]) 12/12/2019   C4-C5  . GERD (gastroesophageal reflux disease)   . Herniated disc, cervical   . Hypoglycemia   . Migraines     Past Surgical History:  Procedure Laterality Date  . ANKLE SURGERY    . DILATION AND CURETTAGE OF UTERUS    . WISDOM TOOTH EXTRACTION      There were no vitals filed for this visit.   Subjective Assessment - 06/28/20 1534    Subjective Filed for disability and unemployment. Finished applying to a job at [employer] this morning. Husband got a new job that starts at the end of September. Has been getting dizzy bending down and picking up objects.    Pertinent History PMH: hx of herniated disc in cervical spine    Diagnostic tests MRI 2/13 cervical spine: No marrow edema to suggest acute cervical spine fracture. Cervical spondylosis as outlined.  No more than mild spinal canalstenosis at any level. Multilevel neural foraminal narrowinggreatest on the left at C4-C5 and bilaterally at C5-C6(moderate/advanced at these sites).MRI brain: Normal MRI appearance of the brain for age. No evidence of acuteintracranial abnormality    Patient Stated Goals be able to walk and drive again to be more independent; to play golf and cornhole again    Currently in Pain? Yes    Pain Score 4     Pain Location Neck   midback, upper traps and neck   Pain Descriptors / Indicators Aching    Pain Type Chronic pain                             [facility] Adult PT Treatment/Exercise - 06/28/20 0001      Neuro Re-ed    Neuro Re-ed Details  Due to pt reporting incr dizziness after coming up from bending over, especially when doing dishes, initiated seated habituation for HEP: performed nose over knee and bending and picking up cone from floor and returning back to sitting towards R with pt reporting 6/10 dizziness when coming upright, instead performed picking up cone from 4" step with pt able to tolerate better rating 3/10 dizziness coming upright in both R/L directions, performed x5 reps B.  Vestibular Treatment/Exercise - 06/28/20 0001      Vestibular Treatment/Exercise   Habituation Exercises --              Balance Exercises - 06/28/20 0001      Balance Exercises: Standing   SLS Limitations Stepping on stepping stones for SLS down and back 4 reps at countertop- progressed to furthering the distance between each, min guard for balance, intermittent UE support    Rockerboard Anterior/posterior;EO    Rockerboard Limitations Standing on rockerboard A/P direction: x15 reps multidirectional ball toss, alternating forward toe taps to cone x10 reps B, performing initially with UE support and progressing to none     Tandem Gait 3 reps;Forward;Limitations;Foam/compliant surface    Tandem Gait Limitations at countertop with light  fingertip support, on blue foam beam    Sidestepping Foam/compliant support;3 reps;Limitations    Sidestepping Limitations performing side step and then mini squat, down and back x3 reps, cues for foot clearance, progressing to no UE support    Other Standing Exercises PT tech performing multi-directional ball toss with pt, once pt catches ball performing mini squat on blue foam beam and then tossing back to tech x12 reps, min guard for balance             PT Education - 06/28/20 1721    Education Details seated habituation exercise to pt's HEP    Person(s) Educated Patient    Methods Explanation;Demonstration;Handout    Comprehension Verbalized understanding;Returned demonstration            PT Short Term Goals - 06/14/20 1601      PT SHORT TERM GOAL #1   Title Pt will demonstrate ability to safely perform land and aquatic HEP with husband's supervision    Baseline reviewed HEP on 06/15/20 on land, pt does not yet have aquatic HEP    Time 6    Period Weeks    Status Partially Met    Target Date 06/13/20      PT SHORT TERM GOAL #2   Title Pt will demonstrate 4 point improvement in FGA with LRAD to indicate decreased falls risk    Baseline 7/30 - 14/30 on 06/14/20, pt with 7 point improvement.    Time 6    Period Weeks    Status Achieved    Target Date 06/13/20      PT SHORT TERM GOAL #3   Title Pt will demonstrate ability to enter/exit pool with ladder with supervision to be able to enter/exit pool at home    Baseline pt reports that there was a leak in her pool and the whole thing has had to be drained    Time 6    Period Weeks    Status Deferred    Target Date 06/13/20      PT SHORT TERM GOAL #4   Title Pt will improve gait speed with LRAD to >/= 1.4 ft/sec    Baseline .53 ft/sec with RW and Min A, 13.07 seconds = 2.51 ft/sec 06/14/20    Time 6    Period Weeks    Status Achieved    Target Date 06/13/20      PT SHORT TERM GOAL #5   Title Pt will improve cervical  extension to >/= 30 deg and bilat rotation to 60 deg to improve mobility for driving    Baseline 45 deg extension, 53 R rotation, 45 L rotation    Time 6    Period Weeks    Status  Partially Met    Target Date 06/13/20      PT SHORT TERM GOAL #6   Title Pt will demonstrate ability to perform sit to stand and stand pivot transfers with LRAD MOD I    Baseline sit <> stand with BUE support mod I, did not perform stand pivot, pt no longer using RW    Time 6    Period Weeks    Status Achieved    Target Date 06/13/20             PT Long Term Goals - 05/05/20 1421      PT LONG TERM GOAL #1   Title Pt will be independent with final HEP in order to build upon functional gains made in therapy.  (ALL GOALS DUE 07/28/20)    Time 12    Period Weeks    Status Revised      PT LONG TERM GOAL #2   Title Pt will ambulate 1000' outside over paved and grassy surfaces without AD, independently to be able to return to golfing    Time 12    Period Weeks    Status Revised      PT LONG TERM GOAL #3   Title Pt will improve FGA score to at least a 20/30 to indicate decreased falls risk in the community    Time 12    Period Weeks    Status Revised      PT LONG TERM GOAL #4   Title Pt will improve B cervical rotation AROM to 65 degrees for functional ROM for driving.    Time 12    Period Weeks    Status Revised      PT LONG TERM GOAL #5   Title Pt will demonstrate ability to perform sit <> stand and stand pivot without AD independently    Time 12    Period Weeks    Status New      PT LONG TERM GOAL #6   Title Pt will improve gait speed without AD to 2.6f/sec in order to decr fall risk and improve community mobility.    Time 12    Period Weeks    Status Revised      PT LONG TERM GOAL #7   Title Pt will demonstrate decreased motion sensitivity as indicated by ability to perform bending down to the floor, performing head turns/nods and body turns without any dizziness; pt will also improve  use of vestibular system as indicated by ability to maintain 30 seconds on each condition of the MCTSIB.    Baseline see Vestibular tab    Time 12    Period Weeks    Status On-going      PT LONG TERM GOAL #8   Title Pt will demonstrate ability to stand without support x 10 minutes to play corn hole and demonstrate ability to hit whiffle golf balls in grass x 10 reps with supervision    Time 12    Period Weeks    Status Revised                 Plan - 06/28/20 1722    Clinical Impression Statement Pt with improvements in gait today ambulating in and out of clinic - pt able to demo decr scissoring and was ambulating with a more consistent heel strike. Added seated habituation for nose over knee due to pt reporting having incr dizziness when bending over to pick up objects/using the dishwasher. Remainder of session focused  on balance strategies on compliant surfaces, pt tolerated well and able to progress without UE support. Will continue to progress towards LTGs.    Personal Factors and Comorbidities Past/Current Experience;Profession    Examination-Activity Limitations Bend;Carry;Dressing;Locomotion Level;Squat;Stairs;Lift;Stand;Transfers;Hygiene/Grooming;Bathing    Examination-Participation Restrictions Cleaning;Community Activity;Driving;Shop;Laundry    Rehab Potential Good    PT Frequency 2x / week    PT Duration 12 weeks    PT Treatment/Interventions ADLs/Self Care Home Management;Canalith Repostioning;DME Instruction;Gait training;Stair training;Functional mobility training;Neuromuscular re-education;Balance training;Therapeutic exercise;Therapeutic activities;Patient/family education;Dry needling;Passive range of motion;Vestibular;Aquatic Therapy;Moist Heat;Cryotherapy;Manual techniques    PT Next Visit Plan how was seated habituation exercises? review and progress if needed for HEP. Standing balance with decreased UE support.  Higher level gait training.    Consulted and Agree  with Plan of Care Patient           Patient will benefit from skilled therapeutic intervention in order to improve the following deficits and impairments:  Abnormal gait, Decreased activity tolerance, Decreased balance, Decreased coordination, Difficulty walking, Dizziness, Pain, Decreased mobility, Decreased range of motion  Visit Diagnosis: Unsteadiness on feet  Other symptoms and signs involving the nervous system  Muscle weakness (generalized)  Dizziness and giddiness     Problem List Patient Active Problem List   Diagnosis Date Noted  . Vitamin D deficiency 01/26/2020  . B12 deficiency 01/25/2020  . Anxiety and depression 01/19/2020  . Alcohol use 01/19/2020  . Concussion with loss of consciousness 01/03/2020  . Thyroid nodule 01/03/2020  . Irregular menses 04/28/2019  . Neuropathy 04/28/2019  . Infertility counseling 10/01/2017  . DDD (degenerative disc disease), cervical 11/29/2016  . Hypoglycemia 11/29/2016  . Kidney stone 11/29/2016  . Sinusitis 11/29/2016  . Insomnia 03/08/2016  . Neck pain 03/08/2016  . 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 [place], [place], [address] Phone: [phone]   Fax:  [phone]  Name: Jane Hendricks MRN: [id number] Date of Birth: 02-Jan-1981

## 2020-06-28 NOTE — Therapy (Signed)
[facility] [address] [place], [place], [address] Phone: [phone]   Fax:  [phone]  Speech Language Pathology Treatment  Patient Details  Name: Jane Hendricks MRN: [id number] Date of Birth: February 26, 1981 Referring Provider (SLP): Jarold Motto PA   Encounter Date: 06/28/2020   End of Session - 06/28/20 1630    Visit Number 7    Number of Visits 17    Date for SLP Re-Evaluation 07/13/20    Authorization Type 60 combined - at eval PT has used 35, their plan is for 55 - will need to coordinate with them    Authorization - Visit Number 40   PT/ST combined   Authorization - Number of Visits 60    SLP Start Time 1450    SLP Stop Time  1527    SLP Time Calculation (min) 37 min    Activity Tolerance Patient tolerated treatment well           Past Medical History:  Diagnosis Date  . Ataxic gait   . Compression fracture of C-spine ([facility]) 12/12/2019   C4-C5  . GERD (gastroesophageal reflux disease)   . Herniated disc, cervical   . Hypoglycemia   . Migraines     Past Surgical History:  Procedure Laterality Date  . ANKLE SURGERY    . DILATION AND CURETTAGE OF UTERUS    . WISDOM TOOTH EXTRACTION      There were no vitals filed for this visit.   Subjective Assessment - 06/28/20 1458    Subjective "I looked over my resume and compared and contrasted the new with the old and made some tweaks."    Currently in Pain? No/denies                 ADULT SLP TREATMENT - 06/28/20 1459      General Information   Behavior/Cognition Alert;Cooperative;Pleasant mood      Treatment Provided   Treatment provided Cognitive-Linquistic      Cognitive-Linquistic Treatment   Treatment focused on Cognition;Patient/family/caregiver education    Skilled Treatment Pt recalling in detail about her resume and cover letters. She reports working on her resume and cover letter - see "s". Organizing/tweaking her cover  letters to tailor the letter to the position. Pt cooking multiple step recipes from scratch both routine and from recipes without diffciulty. Pt agrees with d/c next session.       Assessment / Recommendations / Plan   Plan Continue with current plan of care   likely d/c next visit     Progression Toward Goals   Progression toward goals Progressing toward goals              SLP Short Term Goals - 06/28/20 1633      SLP SHORT TERM GOAL #1   Title Pt will carryover 3 compensatory strategies to recall and complete tasks at work with rare min A over 2 sessions    Baseline 06-06-20   "I don't answer texts or calls about work after 8 pm anymore."   Status Achieved      SLP SHORT TERM GOAL #2   Title Pt will utilize compensations for word finding diffiuclty as needed over 20 minute conversation with rare min A    Status Achieved      SLP SHORT TERM GOAL #3   Title Pt will utilize 2 compensations to attend to, process and recall phone calls and conversations at work    Status Achieved  SLP Long Term Goals - 06/28/20 1633      SLP LONG TERM GOAL #1   Title Pt will report 3 or less incomplete tasks (due to attention, recall or lack of initiatoin) at work or home over a work week    Baseline 06/12/20; 06-21-20, 06-28-20    Status Achieved      SLP LONG TERM GOAL #2   Title Pt will report recalling 90% of conersations and phone calls at work over a work week    Baseline 06/12/20    Status Achieved      SLP LONG TERM GOAL #3   Title pt will report WFL attention skills while cooking and job-like tasks (Word/Excel work) in two sessions    Baseline 06-28-20    Time 2    Period Weeks    Status New            Plan - 06/28/20 1631    Clinical Impression Statement Briarrose continues to demonstrate improving/WNL attention skills s/p concussion. She is now cooking meals from scratch from recipes and from memory. Pt has revamped her resume with help from her husband (husband offered  due to experience with this), and pt has been tweaking it and cover letters for job search. She described logical thinking aobut her job hunt in this session ([employer] is an option as husband's new job is now remote). Reduce frequency to 1x a week with likely d/c in 1-2 visits. Pt in agreement.    Speech Therapy Frequency 1x /week    Duration --   8 weeks or 17 visits   Treatment/Interventions Environmental controls;Cognitive reorganization;Compensatory techniques;Functional tasks;Compensatory strategies;Cueing hierarchy;SLP instruction and feedback;Patient/family education;Multimodal communcation approach;Internal/external aids    Potential to Achieve Goals Good           Patient will benefit from skilled therapeutic intervention in order to improve the following deficits and impairments:   Cognitive communication deficit    Problem List Patient Active Problem List   Diagnosis Date Noted  . Vitamin D deficiency 01/26/2020  . B12 deficiency 01/25/2020  . Anxiety and depression 01/19/2020  . Alcohol use 01/19/2020  . Concussion with loss of consciousness 01/03/2020  . Thyroid nodule 01/03/2020  . Irregular menses 04/28/2019  . Neuropathy 04/28/2019  . Infertility counseling 10/01/2017  . DDD (degenerative disc disease), cervical 11/29/2016  . Hypoglycemia 11/29/2016  . Kidney stone 11/29/2016  . Sinusitis 11/29/2016  . Insomnia 03/08/2016  . Neck pain 03/08/2016  . 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 [address] [place], [place], [address] Phone: [phone]   Fax:  [phone]   Name: Niyanna Asch MRN: [id number] Date of Birth: 01/01/1981

## 2020-06-28 NOTE — Patient Instructions (Signed)
Access Code: [email or web address] URL: [email or web address] Date: 06/28/2020 Prepared by: Sherlie Ban  Exercises Seated Cervical Rotation AROM - 1 x daily - 7 x weekly - 4-5 reps - 4 sets Seated Cervical Sidebending AROM - 1 x daily - 7 x weekly - 4-5 reps - 4 sets Sit to Stand with Arm Reach Toward Target - 2 x daily - 7 x weekly - 2 sets - 5 reps Seated Gaze Stabilization with Head Rotation - 2 x daily - 7 x weekly - 2 sets - 30 seconds hold Seated Gaze Stabilization with Head Nod - 2 x daily - 7 x weekly - 2 sets - 30 seconds hold Staggered Stance Forward Backward Weight Shift with Counter Support - 1 x daily - 7 x weekly - 2 sets - 10 reps Seated Nose to Left Knee Vestibular Habituation - 1-2 x daily - 5 x weekly - 2 sets - 5 reps Seated Nose to Right Knee Vestibular Habituation - 1-2 x daily - 5 x weekly - 2 sets - 5 reps

## 2020-06-28 NOTE — Patient Instructions (Signed)
   Keep up the good work. I know you're excited about discharge next week!

## 2020-07-03 ENCOUNTER — Encounter: Payer: 59 | Admitting: Speech Pathology

## 2020-07-05 ENCOUNTER — Other Ambulatory Visit: Payer: Self-pay

## 2020-07-05 ENCOUNTER — Encounter: Payer: Self-pay | Admitting: Physical Therapy

## 2020-07-05 ENCOUNTER — Ambulatory Visit: Payer: 59 | Attending: Family Medicine | Admitting: Physical Therapy

## 2020-07-05 ENCOUNTER — Ambulatory Visit: Payer: 59

## 2020-07-05 DIAGNOSIS — R41841 Cognitive communication deficit: Secondary | ICD-10-CM | POA: Diagnosis present

## 2020-07-05 DIAGNOSIS — R2681 Unsteadiness on feet: Secondary | ICD-10-CM | POA: Insufficient documentation

## 2020-07-05 DIAGNOSIS — R42 Dizziness and giddiness: Secondary | ICD-10-CM | POA: Insufficient documentation

## 2020-07-05 DIAGNOSIS — M6281 Muscle weakness (generalized): Secondary | ICD-10-CM | POA: Insufficient documentation

## 2020-07-05 DIAGNOSIS — R2689 Other abnormalities of gait and mobility: Secondary | ICD-10-CM | POA: Diagnosis present

## 2020-07-05 DIAGNOSIS — R29818 Other symptoms and signs involving the nervous system: Secondary | ICD-10-CM | POA: Diagnosis present
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
## 2020-07-05 NOTE — Patient Instructions (Signed)
Access Code: [email or web address] URL: [email or web address] Date: 06/28/2020 Prepared by: Chloe Gilgannon  Exercises Seated Cervical Rotation AROM - 1 x daily - 7 x weekly - 4-5 reps - 4 sets Seated Cervical Sidebending AROM - 1 x daily - 7 x weekly - 4-5 reps - 4 sets Sit to Stand with Arm Reach Toward Target - 2 x daily - 7 x weekly - 2 sets - 5 reps Seated Gaze Stabilization with Head Rotation - 2 x daily - 7 x weekly - 2 sets - 30 seconds hold Seated Gaze Stabilization with Head Nod - 2 x daily - 7 x weekly - 2 sets - 30 seconds hold Staggered Stance Forward Backward Weight Shift with Counter Support - 1 x daily - 7 x weekly - 2 sets - 10 reps Seated Nose to Left Knee Vestibular Habituation - 1-2 x daily - 5 x weekly - 2 sets - 5 reps Seated Nose to Right Knee Vestibular Habituation - 1-2 x daily - 5 x weekly - 2 sets - 5 reps 
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
## 2020-07-07 ENCOUNTER — Encounter: Payer: Self-pay | Admitting: Family Medicine

## 2020-07-07 ENCOUNTER — Ambulatory Visit (INDEPENDENT_AMBULATORY_CARE_PROVIDER_SITE_OTHER): Payer: 59 | Admitting: Family Medicine

## 2020-07-07 ENCOUNTER — Other Ambulatory Visit: Payer: Self-pay

## 2020-07-07 VITALS — BP 104/70 | HR 95 | Ht 65.5 in | Wt 139.8 lb

## 2020-07-07 DIAGNOSIS — S060X9D Concussion with loss of consciousness of unspecified duration, subsequent encounter: Secondary | ICD-10-CM | POA: Diagnosis not present

## 2020-07-07 DIAGNOSIS — R269 Unspecified abnormalities of gait and mobility: Secondary | ICD-10-CM | POA: Diagnosis not present

## 2020-07-07 NOTE — Patient Instructions (Addendum)
Thank you for coming in today. Plan for nerve study.  I will try to arrange it at your Neurology office.  We will send a order.  Your neurologist may need to order it.  I will also wrist a letter   As for [place]:  Recheck as needed.
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
## 2020-07-12 ENCOUNTER — Other Ambulatory Visit: Payer: Self-pay

## 2020-07-12 ENCOUNTER — Ambulatory Visit: Payer: 59 | Admitting: Physical Therapy

## 2020-07-12 DIAGNOSIS — R2689 Other abnormalities of gait and mobility: Secondary | ICD-10-CM

## 2020-07-12 DIAGNOSIS — M6281 Muscle weakness (generalized): Secondary | ICD-10-CM

## 2020-07-12 DIAGNOSIS — R2681 Unsteadiness on feet: Secondary | ICD-10-CM

## 2020-07-12 DIAGNOSIS — R42 Dizziness and giddiness: Secondary | ICD-10-CM

## 2020-07-13 NOTE — Therapy (Signed)
[facility] [address] [place], [place], [address] Phone: [phone]   Fax:  [phone]  Physical Therapy Treatment  Patient Details  Name: Jane Hendricks MRN: [id number] Date of Birth: Apr 27, 1981 Referring Provider (PT): Lynne Leader, MD   Encounter Date: 07/12/2020   PT End of Session - 07/13/20 1653    Visit Number 43    Number of Visits 55    Date for PT Re-Evaluation 07/27/20    Authorization Type [employer] - $40 copay - VL: 60 (PT/OT/ST combined) hard max; each discipline counts as one visit.  Beyond 60 visits pt would be considered self-pay and would have to submit bills to third party payer for reimbursement    Authorization - Visit Number 51   PT/ST combined   Authorization - Number of Visits 60    PT Start Time 1533    PT Stop Time 1615    PT Time Calculation (min) 42 min    Equipment Utilized During Treatment Gait belt    Activity Tolerance Patient tolerated treatment well    Behavior During Therapy WFL for tasks assessed/performed           Past Medical History:  Diagnosis Date  . Ataxic gait   . Compression fracture of C-spine ([place]) 12/12/2019   C4-C5  . GERD (gastroesophageal reflux disease)   . Herniated disc, cervical   . Hypoglycemia   . Migraines     Past Surgical History:  Procedure Laterality Date  . ANKLE SURGERY    . DILATION AND CURETTAGE OF UTERUS    . WISDOM TOOTH EXTRACTION      There were no vitals filed for this visit.   Subjective Assessment - 07/12/20 1535    Subjective feeling a little more off balanced today. Feels like her balance and walking has got a little worse due to doing a lot of walkign over te weekend. Has applied to 7 jobs. has not had the chance to go to the gym or driving range yet - planning on going to the gym today.    Pertinent History PMH: hx of herniated disc in cervical spine    Diagnostic tests MRI 2/13 cervical spine: No marrow edema to suggest acute  cervical spine fracture. Cervical spondylosis as outlined. No more than mild spinal canalstenosis at any level. Multilevel neural foraminal narrowinggreatest on the left at C4-C5 and bilaterally at C5-C6(moderate/advanced at these sites).MRI brain: Normal MRI appearance of the brain for age. No evidence of acuteintracranial abnormality    Patient Stated Goals be able to walk and drive again to be more independent; to play golf and cornhole again    Currently in Pain? Yes    Pain Score 3     Pain Location Neck   upper traps and neck                            [facility] Adult PT Treatment/Exercise - 07/13/20 0001      Ambulation/Gait   Ambulation/Gait Yes    Ambulation/Gait Assistance 5: Supervision;4: Min guard    Ambulation/Gait Assistance Details pt ambulating back into clinic and holding onto the walls at times for balance, pt needing min guard for balance for gait after stepping off treadmill/elliptical    Ambulation Distance (Feet) --   clinic distances   Assistive device None    Gait Pattern Step-through pattern;Wide base of support    Ambulation Surface Level;Indoor  Therapeutic Activites    Therapeutic Activities Other Therapeutic Activities    Other Therapeutic Activities Pt wants to return to the gym and use equipment: trialed the elliptical with pt able to get on with supervision, performed 5 minutes at level 1.0 - pt reporting no dizziness while on elliptical, instructed pt to stop before slowly getting off and holding onto elliptical to steady self before walking away, after getting on the ground pt having mild dizziness and took incr time before pt was able to walk, pt needing min guard for gait afterwards. After a seated rest break and other exercise performed the treadmill at level 1.5 for 5 minutes with pt reporting no dizziness, pt stopped and steadied herself on handrail after stepping back onto floor, pt reporting no dizziness however when pt was ready to  walk away, needed min guard for balance to head back to mat to sit down. Educated pt that would need to trial again in PT before pt would be safe to do by herself in the gym or have husband there with her when pt would be getting off equipment for safety with gait, pt verbalized understanding.                Balance Exercises - 07/13/20 0001      Balance Exercises: Standing   Other Standing Exercises performed standing habituation: standing and performing mini squat and picking up 4 cones in an arc and then placing on table and then repeat using opposite UE x2 reps each, pt reporting mild dizziness sx, verbally added to HEP             PT Education - 07/13/20 1653    Education Details see TA, standing habituation to HEP    Person(s) Educated Patient    Methods Explanation;Demonstration    Comprehension Verbalized understanding;Returned demonstration            PT Short Term Goals - 06/14/20 1601      PT SHORT TERM GOAL #1   Title Pt will demonstrate ability to safely perform land and aquatic HEP with husband's supervision    Baseline reviewed HEP on 06/15/20 on land, pt does not yet have aquatic HEP    Time 6    Period Weeks    Status Partially Met    Target Date 06/13/20      PT SHORT TERM GOAL #2   Title Pt will demonstrate 4 point improvement in FGA with LRAD to indicate decreased falls risk    Baseline 7/30 - 14/30 on 06/14/20, pt with 7 point improvement.    Time 6    Period Weeks    Status Achieved    Target Date 06/13/20      PT SHORT TERM GOAL #3   Title Pt will demonstrate ability to enter/exit pool with ladder with supervision to be able to enter/exit pool at home    Baseline pt reports that there was a leak in her pool and the whole thing has had to be drained    Time 6    Period Weeks    Status Deferred    Target Date 06/13/20      PT SHORT TERM GOAL #4   Title Pt will improve gait speed with LRAD to >/= 1.4 ft/sec    Baseline .97 ft/sec with RW and  Min A, 13.07 seconds = 2.51 ft/sec 06/14/20    Time 6    Period Weeks    Status Achieved    Target Date  06/13/20      PT SHORT TERM GOAL #5   Title Pt will improve cervical extension to >/= 30 deg and bilat rotation to 60 deg to improve mobility for driving    Baseline 45 deg extension, 53 R rotation, 45 L rotation    Time 6    Period Weeks    Status Partially Met    Target Date 06/13/20      PT SHORT TERM GOAL #6   Title Pt will demonstrate ability to perform sit to stand and stand pivot transfers with LRAD MOD I    Baseline sit <> stand with BUE support mod I, did not perform stand pivot, pt no longer using RW    Time 6    Period Weeks    Status Achieved    Target Date 06/13/20             PT Long Term Goals - 05/05/20 1421      PT LONG TERM GOAL #1   Title Pt will be independent with final HEP in order to build upon functional gains made in therapy.  (ALL GOALS DUE 07/28/20)    Time 12    Period Weeks    Status Revised      PT LONG TERM GOAL #2   Title Pt will ambulate 1000' outside over paved and grassy surfaces without AD, independently to be able to return to golfing    Time 12    Period Weeks    Status Revised      PT LONG TERM GOAL #3   Title Pt will improve FGA score to at least a 20/30 to indicate decreased falls risk in the community    Time 12    Period Weeks    Status Revised      PT LONG TERM GOAL #4   Title Pt will improve B cervical rotation AROM to 65 degrees for functional ROM for driving.    Time 12    Period Weeks    Status Revised      PT LONG TERM GOAL #5   Title Pt will demonstrate ability to perform sit <> stand and stand pivot without AD independently    Time 12    Period Weeks    Status New      PT LONG TERM GOAL #6   Title Pt will improve gait speed without AD to 2.11f/sec in order to decr fall risk and improve community mobility.    Time 12    Period Weeks    Status Revised      PT LONG TERM GOAL #7   Title Pt will  demonstrate decreased motion sensitivity as indicated by ability to perform bending down to the floor, performing head turns/nods and body turns without any dizziness; pt will also improve use of vestibular system as indicated by ability to maintain 30 seconds on each condition of the MCTSIB.    Baseline see Vestibular tab    Time 12    Period Weeks    Status On-going      PT LONG TERM GOAL #8   Title Pt will demonstrate ability to stand without support x 10 minutes to play corn hole and demonstrate ability to hit whiffle golf balls in grass x 10 reps with supervision    Time 12    Period Weeks    Status Revised                 Plan - 07/13/20 1654  Clinical Impression Statement Progressed seated habituation exercise from sitting to standing with pt reporting mild dizziness symptoms. Trialed the elliptical and treadmill to determine if pt would be safe to try them again at the gym, however after pt gets off and steadies herself on equipment for balance, pt reports mild dizziness and needs min guard for safety for gait afterwards. Told pt to hold off performing independently at this time, or make sure that pt's husband is there with her for safety. Will continue to progress towards LTGs.    Personal Factors and Comorbidities Past/Current Experience;Profession    Examination-Activity Limitations Bend;Carry;Dressing;Locomotion Level;Squat;Stairs;Lift;Stand;Transfers;Hygiene/Grooming;Bathing    Examination-Participation Restrictions Cleaning;Community Activity;Driving;Shop;Laundry    Rehab Potential Good    PT Frequency 2x / week    PT Duration 12 weeks    PT Treatment/Interventions ADLs/Self Care Home Management;Canalith Repostioning;DME Instruction;Gait training;Stair training;Functional mobility training;Neuromuscular re-education;Balance training;Therapeutic exercise;Therapeutic activities;Patient/family education;Dry needling;Passive range of motion;Vestibular;Aquatic Therapy;Moist  Heat;Cryotherapy;Manual techniques    PT Next Visit Plan Begin incorporating golfing activities into balance training. how was standing habituation for HEP? start checking goals. stair/curb training. Standing balance with decreased UE support.  Higher level gait training.    Consulted and Agree with Plan of Care Patient           Patient will benefit from skilled therapeutic intervention in order to improve the following deficits and impairments:  Abnormal gait, Decreased activity tolerance, Decreased balance, Decreased coordination, Difficulty walking, Dizziness, Pain, Decreased mobility, Decreased range of motion  Visit Diagnosis: Unsteadiness on feet  Muscle weakness (generalized)  Dizziness and giddiness  Other abnormalities of gait and mobility     Problem List Patient Active Problem List   Diagnosis Date Noted  . Vitamin D deficiency 01/26/2020  . B12 deficiency 01/25/2020  . Anxiety and depression 01/19/2020  . Alcohol use 01/19/2020  . Concussion with loss of consciousness 01/03/2020  . Thyroid nodule 01/03/2020  . Irregular menses 04/28/2019  . Neuropathy 04/28/2019  . Infertility counseling 10/01/2017  . DDD (degenerative disc disease), cervical 11/29/2016  . Hypoglycemia 11/29/2016  . Kidney stone 11/29/2016  . Sinusitis 11/29/2016  . Insomnia 03/08/2016  . Neck pain 03/08/2016  . 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 [place], [place], [address] Phone: [phone]   Fax:  [phone]  Name: Shilynn Hoch MRN: [id number] Date of Birth: 1981-08-08

## 2020-07-19 ENCOUNTER — Ambulatory Visit: Payer: 59 | Admitting: Physical Therapy

## 2020-07-19 ENCOUNTER — Ambulatory Visit: Payer: 59 | Admitting: Psychology

## 2020-07-26 ENCOUNTER — Ambulatory Visit: Payer: 59 | Admitting: Physical Therapy

## 2020-07-30 ENCOUNTER — Other Ambulatory Visit (HOSPITAL_COMMUNITY): Payer: Self-pay | Admitting: Psychiatry

## 2020-08-02 ENCOUNTER — Ambulatory Visit: Payer: 59 | Admitting: Physical Therapy

## 2020-08-06 ENCOUNTER — Encounter: Payer: Self-pay | Admitting: Family Medicine

## 2020-08-07 MED ORDER — LAMOTRIGINE 25 MG PO TABS: 50.0000 mg | ORAL_TABLET | Freq: Every day | ORAL | 1 refills | Status: DC

## 2020-08-08 ENCOUNTER — Ambulatory Visit: Payer: 59 | Admitting: Dermatology

## 2020-08-18 ENCOUNTER — Ambulatory Visit: Payer: 59 | Attending: Family Medicine | Admitting: Physical Therapy

## 2020-08-27 ENCOUNTER — Encounter: Payer: Self-pay | Admitting: Family Medicine

## 2020-08-29 MED ORDER — CYCLOBENZAPRINE HCL 10 MG PO TABS: 5.0000 mg | ORAL_TABLET | Freq: Two times a day (BID) | ORAL | 3 refills | Status: DC | PRN

## 2020-09-19 ENCOUNTER — Other Ambulatory Visit: Payer: Self-pay

## 2020-09-19 ENCOUNTER — Ambulatory Visit (INDEPENDENT_AMBULATORY_CARE_PROVIDER_SITE_OTHER): Payer: Self-pay | Admitting: Physician Assistant

## 2020-09-19 ENCOUNTER — Encounter: Payer: Self-pay | Admitting: Physician Assistant

## 2020-09-19 VITALS — BP 130/84 | HR 105 | Temp 98.4°F | Ht 65.5 in | Wt 140.0 lb

## 2020-09-19 DIAGNOSIS — L723 Sebaceous cyst: Secondary | ICD-10-CM

## 2020-09-19 DIAGNOSIS — F419 Anxiety disorder, unspecified: Secondary | ICD-10-CM | POA: Diagnosis not present

## 2020-09-19 DIAGNOSIS — R7989 Other specified abnormal findings of blood chemistry: Secondary | ICD-10-CM | POA: Diagnosis not present

## 2020-09-19 DIAGNOSIS — E538 Deficiency of other specified B group vitamins: Secondary | ICD-10-CM | POA: Diagnosis not present

## 2020-09-19 DIAGNOSIS — F32A Depression, unspecified: Secondary | ICD-10-CM

## 2020-09-19 NOTE — Progress Notes (Addendum)
Jane Hendricks is a 39 y.o. female is here for follow up.  I acted as a Education administrator for [employer], PA-C Anselmo Pickler, LPN   History of Present Illness:   Chief Complaint  Patient presents with  . Anxiety  . Depression    HPI   Anxiety/Depression Pt following up today, currently taking Wellbutrin XL 150 mg daily, Buspar 7.5 mg BID, Clonazepam 0.5 mg tablet BID, Prozac 40 mg daily. Pt says she is feeling little better but has not seen therapist since here last. Pt would like a referral to another therapist.  Reports that she is drinking just a few glasses of wine per week. Has stopped drinking liquor. Husband is starting a new job on Monday. This will be remote.  Sebaceous cyst Patient has a sebaceous cyst on her upper L back that she would like removed. Area is growing. Can be tender at times. Denies: fever, chills, discharge.  Elevated LFTs Patient reports reduction in alcohol intake since we last met. We need to update her hepatic function panel today. Denies abdominal pain or jaundice.  B12 deficiency Due for updated labs today. Would like to resume B12 injections if needed.   Health Maintenance Due  Topic Date Due  . Hepatitis C Screening  Never done    Past Medical History:  Diagnosis Date  . Ataxic gait   . Compression fracture of C-spine ([place]) 12/12/2019   C4-C5  . GERD (gastroesophageal reflux disease)   . Herniated disc, cervical   . Hypoglycemia   . Migraines      Social History   Tobacco Use  . Smoking status: Never Smoker  . Smokeless tobacco: Never Used  Vaping Use  . Vaping Use: Never used  Substance Use Topics  . Alcohol use: Yes    Alcohol/week: 14.0 standard drinks    Types: 14 Glasses of wine per week  . Drug use: No    Past Surgical History:  Procedure Laterality Date  . ANKLE SURGERY    . DILATION AND CURETTAGE OF UTERUS    . WISDOM TOOTH EXTRACTION      Family History  Problem Relation Age of Onset  . Osteoarthritis  Mother   . Hyperlipidemia Mother   . Hypertension Mother   . Kidney disease Father   . Osteoarthritis Maternal Grandmother   . COPD Maternal Grandmother   . Osteoarthritis Maternal Grandfather   . Lung cancer Maternal Grandfather   . Diabetes Maternal Grandfather   . Hyperlipidemia Maternal Grandfather   . Hypertension Maternal Grandfather   . Osteoarthritis Paternal Grandmother   . COPD Paternal Grandmother   . Osteoarthritis Paternal Grandfather   . Colon cancer Paternal Grandfather        after age 57  . Lung cancer Maternal Aunt        mets to brain  . Breast cancer Neg Hx     PMHx, SurgHx, SocialHx, FamHx, Medications, and Allergies were reviewed in the Visit Navigator and updated as appropriate.   Patient Active Problem List   Diagnosis Date Noted  . Vitamin D deficiency 01/26/2020  . B12 deficiency 01/25/2020  . Anxiety and depression 01/19/2020  . Alcohol use 01/19/2020  . Concussion with loss of consciousness 01/03/2020  . Thyroid nodule 01/03/2020  . Irregular menses 04/28/2019  . Neuropathy 04/28/2019  . Infertility counseling 10/01/2017  . DDD (degenerative disc disease), cervical 11/29/2016  . Hypoglycemia 11/29/2016  . Kidney stone 11/29/2016  . Sinusitis 11/29/2016  . Insomnia 03/08/2016  .  Neck pain 03/08/2016  . 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 Smoking status: Never Smoker  . Smokeless tobacco: Never Used  Vaping Use  . Vaping Use: Never used  Substance Use Topics  . Alcohol use: Yes    Alcohol/week: 14.0 standard drinks    Types: 14 Glasses of wine per week  . Drug use: No    Current Medications and Allergies:    Current Outpatient Medications:  .  acetaminophen (TYLENOL) 500 MG tablet, Take 1,000 mg by mouth every 6 (six) hours as needed., Disp: , Rfl:  .  AMBULATORY NON FORMULARY MEDICATION, [place]  Disp 1 Use as needed. Ataxia R27.0, Disp: 1 each, Rfl: 0 .  Ascorbic Acid (VITA-C PO), Take 2 each by mouth  daily., Disp: , Rfl:  .  B Complex Vitamins (B COMPLEX-B12 PO), Take 1 tablet by mouth daily., Disp: , Rfl:  .  Biotin w/ Vitamins C & E (HAIR SKIN & NAILS GUMMIES PO), Take 2 each by mouth daily., Disp: , Rfl:  .  buPROPion (WELLBUTRIN XL) 150 MG 24 hr tablet, Take 1 tablet (150 mg total) by mouth every morning., Disp: 90 tablet, Rfl: 2 .  busPIRone (BUSPAR) 7.5 MG tablet, Take 1 tablet (7.5 mg total) by mouth 2 (two) times daily., Disp: 60 tablet, Rfl: 6 .  Calcium Carbonate (CALCIUM 500 PO), Take 2 tablets by mouth daily., Disp: , Rfl:  .  celecoxib (CELEBREX) 200 MG capsule, Take 1 capsule (200 mg total) by mouth daily., Disp: 90 capsule, Rfl: 1 .  clonazePAM (KLONOPIN) 0.5 MG tablet, Take 1 tablet (0.5 mg total) by mouth 2 (two) times daily., Disp: 60 tablet, Rfl: 1 .  cyclobenzaprine (FLEXERIL) 10 MG tablet, Take 0.5-1 tablets (5-10 mg total) by mouth 2 (two) times daily as needed for muscle spasms., Disp: 60 tablet, Rfl: 3 .  famotidine (PEPCID) 10 MG tablet, Take 10 mg by mouth daily. , Disp: , Rfl:  .  Fiber Adult Gummies 2 g CHEW, Chew 2 each by mouth daily., Disp: , Rfl:  .  FLUoxetine (PROZAC) 40 MG capsule, Take 40 mg by mouth daily., Disp: , Rfl:  .  ibuprofen (ADVIL) 200 MG tablet, Take 400 mg by mouth every 6 (six) hours as needed., Disp: , Rfl:  .  Lactobacillus-Inulin (PROBIOTIC DIGESTIVE SUPPORT PO), Take 2 each by mouth daily., Disp: , Rfl:  .  lamoTRIgine (LAMICTAL) 25 MG tablet, Take 2 tablets (50 mg total) by mouth daily., Disp: 180 tablet, Rfl: 1 .  meclizine (ANTIVERT) 25 MG tablet, Take 1 tablet (25 mg total) by mouth 3 (three) times daily as needed for dizziness or nausea., Disp: 30 tablet, Rfl: 3 .  Melatonin 5 MG CHEW, Chew 1 each by mouth daily., Disp: , Rfl:  .  Multiple Vitamins-Minerals (ONE-A-DAY WOMENS PO), Take 1 tablet by mouth daily., Disp: , Rfl:  .  mupirocin ointment (BACTROBAN) 2 %, Apply to nasal area 1-2 times daily, Disp: 22 g, Rfl: 0 .  traZODone  (DESYREL) 50 MG tablet, Take 2 tablets (100 mg total) by mouth at bedtime as needed for sleep. (Patient taking differently: Take 50 mg by mouth at bedtime as needed for sleep. ), Disp: 90 tablet, Rfl: 1 .  vitamin E (VITAMIN E) 180 MG (400 UNITS) capsule, Take 400 Units by mouth daily., Disp: , Rfl:    Allergies  Allergen Reactions  . [employer]      Walnuts-tongue swelling  Review of Systems   ROS Negative unless otherwise specified per HPI.  Vitals:   Vitals:   09/19/20 1523  BP: 130/84  Pulse: (!) 105  Temp: 98.4 F (36.9 C)  TempSrc: Temporal  SpO2: 97%  Weight: 140 lb (63.5 kg)  Height: 5' 5.5" (1.664 m)     Body mass index is 22.94 kg/m.   Physical Exam:    Physical Exam Vitals and nursing note reviewed.  Constitutional:      General: She is not in acute distress.    Appearance: She is well-developed. She is not ill-appearing or toxic-appearing.  Cardiovascular:     Rate and Rhythm: Normal rate and regular rhythm.     Pulses: Normal pulses.     Heart sounds: Normal heart sounds, S1 normal and S2 normal.     Comments: No LE edema Pulmonary:     Effort: Pulmonary effort is normal.     Breath sounds: Normal breath sounds.  Skin:    General: Skin is warm and dry.     Comments: 1 cm sebaceous cyst to L upper back without erythema  Neurological:     Mental Status: She is alert.     GCS: GCS eye subscore is 4. GCS verbal subscore is 5. GCS motor subscore is 6.  Psychiatric:        Speech: Speech normal.        Behavior: Behavior normal. Behavior is cooperative.     I&D Meds, vitals, and allergies reviewed.  Indication: sebaceous cyst Pt complaints of: erythema, pain, swelling Location: L upper back Size: 1 cm  Informed consent obtained.  Pt aware of risks not limited to but including infection, bleeding, damage to near by organs.  Prep: etoh/betadine  Anesthesia: 1%lidocaine with epi, good effect  Incision made with 3 mm punch  Wound  explored and loculations removed, purulent area was drained and area was expressed  Tolerated well    Assessment and Plan:    Jane Hendricks was seen today for anxiety and depression.  Diagnoses and all orders for this visit:  B12 deficiency Update B12 and provide recommendations on supplementation accordingly. -     Vitamin B12; Future -     Vitamin B12  Elevated LFTs Update LFTs and determine need for further work-up as indicated. -     Hepatic function panel; Future -     Hepatic function panel  Sebaceous cyst I&D performed. Tolerated well. Discussed if any redness, fever, chills, further purulent discharge to follow-up with [place] ASAP.  Anxiety and depression Well controlled with medication but interested in talk therapy. Continue Wellbutrin XL 150 mg daily, Buspar 7.5 mg BID, Clonazepam 0.5 mg tablet BID, Prozac 40 mg daily. Referral placed for [facility]. -     Ambulatory referral to Psychology   CMA or LPN served as scribe during this visit. History, Physical, and Plan performed by medical provider. The above documentation has been reviewed and is accurate and complete.  Inda Coke, PA-C [place] [place] 09/19/2020  Follow-up: No follow-ups on file.

## 2020-09-19 NOTE — Patient Instructions (Signed)
It was great to see you!  Update liver function and B12 today.  Incision and Drainage, Care After This sheet gives you information about how to care for yourself after your procedure. Your health care provider may also give you more specific instructions. If you have problems or questions, contact your health care provider. What can I expect after the procedure? After the procedure, it is common to have:  Pain or discomfort around the incision site.  Blood, fluid, or pus (drainage) from the incision.  Redness and firm skin around the incision site. Follow these instructions at home: Medicines  Take over-the-counter and prescription medicines only as told by your health care provider.  If you were prescribed an antibiotic medicine, use or take it as told by your health care provider. Do not stop using the antibiotic even if you start to feel better. Wound care Follow instructions from your health care provider about how to take care of your wound. Make sure you:  Wash your hands with soap and water before and after you change your bandage (dressing). If soap and water are not available, use hand sanitizer.  Change your dressing and packing as told by your health care provider. ? If your dressing is dry or stuck when you try to remove it, moisten or wet the dressing with saline or water so that it can be removed without harming your skin or tissues. ? If your wound is packed, leave it in place until your health care provider tells you to remove it. To remove the packing, moisten or wet the packing with saline or water so that it can be removed without harming your skin or tissues.  Leave stitches (sutures), skin glue, or adhesive strips in place. These skin closures may need to stay in place for 2 weeks or longer. If adhesive strip edges start to loosen and curl up, you may trim the loose edges. Do not remove adhesive strips completely unless your health care provider tells you to do  that. Check your wound every day for signs of infection. Check for:  More redness, swelling, or pain.  More fluid or blood.  Warmth.  Pus or a bad smell. If you were sent home with a drain tube in place, follow instructions from your health care provider about:  How to empty it.  How to care for it at home.  General instructions  Rest the affected area.  Do not take baths, swim, or use a hot tub until your health care provider approves. Ask your health care provider if you may take showers. You may only be allowed to take sponge baths.  Return to your normal activities as told by your health care provider. Ask your health care provider what activities are safe for you. Your health care provider may put you on activity or lifting restrictions.  The incision will continue to drain. It is normal to have some clear or slightly bloody drainage. The amount of drainage should lessen each day.  Do not apply any creams, ointments, or liquids unless you have been told to by your health care provider.  Keep all follow-up visits as told by your health care provider. This is important. Contact a health care provider if:  Your cyst or abscess returns.  You have a fever or chills.  You have more redness, swelling, or pain around your incision.  You have more fluid or blood coming from your incision.  Your incision feels warm to the touch.  You have  pus or a bad smell coming from your incision.  You have red streaks above or below the incision site. Get help right away if:  You have severe pain or bleeding.  You cannot eat or drink without vomiting.  You have decreased urine output.  You become short of breath.  You have chest pain.  You cough up blood.  The affected area becomes numb or starts to tingle. These symptoms may represent a serious problem that is an emergency. Do not wait to see if the symptoms will go away. Get medical help right away. Call your local  emergency services (911 in the [place] Do not drive yourself to the hospital. Summary  After this procedure, it is common to have fluid, blood, or pus coming from the surgery site.  Follow all home care instructions. You will be told how to take care of your incision, how to check for infection, and how to take medicines.  If you were prescribed an antibiotic medicine, take it as told by your health care provider. Do not stop taking the antibiotic even if you start to feel better.  Contact a health care provider if you have increased redness, swelling, or pain around your incision. Get help right away if you have chest pain, you vomit, you cough up blood, or you have shortness of breath.  Keep all follow-up visits as told by your health care provider. This is important. This information is not intended to replace advice given to you by your health care provider. Make sure you discuss any questions you have with your health care provider. Document Revised: 09/21/2018 Document Reviewed: 09/21/2018 Elsevier Patient Education  2020 [employer].   Take care,  Jane Motto PA-C

## 2020-09-20 ENCOUNTER — Telehealth: Payer: Self-pay | Admitting: Family Medicine

## 2020-09-20 LAB — HEPATIC FUNCTION PANEL
AG Ratio: 1.3 (calc) (ref 1.0–2.5)
ALT: 57 U/L — ABNORMAL HIGH (ref 6–29)
AST: 90 U/L — ABNORMAL HIGH (ref 10–30)
Albumin: 3.7 g/dL (ref 3.6–5.1)
Alkaline phosphatase (APISO): 134 U/L — ABNORMAL HIGH (ref 31–125)
Bilirubin, Direct: 0.1 mg/dL (ref 0.0–0.2)
Globulin: 2.9 g/dL (calc) (ref 1.9–3.7)
Indirect Bilirubin: 0.4 mg/dL (calc) (ref 0.2–1.2)
Total Bilirubin: 0.5 mg/dL (ref 0.2–1.2)
Total Protein: 6.6 g/dL (ref 6.1–8.1)

## 2020-09-20 LAB — VITAMIN B12: Vitamin B-12: 1422 pg/mL — ABNORMAL HIGH (ref 200–1100)

## 2020-09-20 NOTE — Telephone Encounter (Signed)
Nerve conduction study received from [facility] neurology. Date of service August 29, 2020. Nerve conduction study was normal.    Results will be sent to be scanned.

## 2020-09-25 ENCOUNTER — Encounter: Payer: Self-pay | Admitting: Physician Assistant

## 2020-09-25 ENCOUNTER — Ambulatory Visit: Payer: 59 | Admitting: Family Medicine

## 2020-09-26 ENCOUNTER — Other Ambulatory Visit: Payer: Self-pay | Admitting: Physician Assistant

## 2020-09-26 DIAGNOSIS — R7989 Other specified abnormal findings of blood chemistry: Secondary | ICD-10-CM

## 2020-10-04 DIAGNOSIS — F4310 Post-traumatic stress disorder, unspecified: Secondary | ICD-10-CM | POA: Insufficient documentation

## 2020-10-04 DIAGNOSIS — F451 Undifferentiated somatoform disorder: Secondary | ICD-10-CM | POA: Insufficient documentation

## 2020-10-04 DIAGNOSIS — R419 Unspecified symptoms and signs involving cognitive functions and awareness: Secondary | ICD-10-CM | POA: Insufficient documentation

## 2020-10-04 DIAGNOSIS — F322 Major depressive disorder, single episode, severe without psychotic features: Secondary | ICD-10-CM | POA: Insufficient documentation

## 2020-10-04 DIAGNOSIS — F1011 Alcohol abuse, in remission: Secondary | ICD-10-CM | POA: Insufficient documentation
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
## 2020-10-09 ENCOUNTER — Other Ambulatory Visit: Payer: Self-pay

## 2020-10-09 ENCOUNTER — Encounter: Payer: Self-pay | Admitting: Family Medicine

## 2020-10-09 ENCOUNTER — Ambulatory Visit (INDEPENDENT_AMBULATORY_CARE_PROVIDER_SITE_OTHER): Payer: Commercial Managed Care - PPO | Admitting: Family Medicine

## 2020-10-09 VITALS — BP 104/70 | HR 86 | Ht 65.5 in | Wt 141.0 lb

## 2020-10-09 DIAGNOSIS — F451 Undifferentiated somatoform disorder: Secondary | ICD-10-CM

## 2020-10-09 DIAGNOSIS — F4310 Post-traumatic stress disorder, unspecified: Secondary | ICD-10-CM

## 2020-10-09 DIAGNOSIS — F0781 Postconcussional syndrome: Secondary | ICD-10-CM

## 2020-10-09 DIAGNOSIS — R419 Unspecified symptoms and signs involving cognitive functions and awareness: Secondary | ICD-10-CM

## 2020-10-09 DIAGNOSIS — F332 Major depressive disorder, recurrent severe without psychotic features: Secondary | ICD-10-CM

## 2020-10-09 HISTORY — DX: Postconcussional syndrome: F07.81

## 2020-10-09 NOTE — Patient Instructions (Signed)
Thank you for coming in today.  Work on Print production planner.   Avoid alcohol.  Recheck with me as needed.   Read that eval and ask questions.   Read about somatic disorder.   Mental health is import here.

## 2020-10-15 ENCOUNTER — Encounter: Payer: Self-pay | Admitting: Family Medicine

## 2020-10-18 ENCOUNTER — Other Ambulatory Visit: Payer: Self-pay | Admitting: Physician Assistant

## 2020-10-18 MED ORDER — PANTOPRAZOLE SODIUM 40 MG PO TBEC: 40.0000 mg | DELAYED_RELEASE_TABLET | Freq: Every day | ORAL | 1 refills | Status: DC

## 2020-10-18 NOTE — Telephone Encounter (Signed)
We did NOT reach out to schedule patient, awaiting your review of message before determining if visit needed for this patient.

## 2020-10-23 ENCOUNTER — Other Ambulatory Visit: Payer: Self-pay | Admitting: Physician Assistant

## 2020-10-23 MED ORDER — BUSPIRONE HCL 7.5 MG PO TABS: 7.5000 mg | ORAL_TABLET | Freq: Two times a day (BID) | ORAL | 6 refills | Status: DC

## 2020-10-23 MED ORDER — BUPROPION HCL ER (XL) 150 MG PO TB24: 150.0000 mg | ORAL_TABLET | ORAL | 2 refills | Status: DC

## 2020-10-23 MED ORDER — CLONAZEPAM 0.5 MG PO TABS: 0.5000 mg | ORAL_TABLET | Freq: Two times a day (BID) | ORAL | 1 refills | Status: DC

## 2020-11-06 ENCOUNTER — Other Ambulatory Visit: Payer: Self-pay | Admitting: Physician Assistant

## 2020-11-06 MED ORDER — HYDROCODONE-ACETAMINOPHEN 5-325 MG PO TABS: 1.0000 | ORAL_TABLET | Freq: Every evening | ORAL | 0 refills | Status: DC | PRN

## 2020-11-27 ENCOUNTER — Encounter: Payer: Self-pay | Admitting: Family Medicine

## 2020-11-28 MED ORDER — CYCLOBENZAPRINE HCL 10 MG PO TABS
5.0000 mg | ORAL_TABLET | Freq: Two times a day (BID) | ORAL | 4 refills | Status: AC | PRN
Start: 2020-11-28 — End: ?

## 2020-11-28 MED ORDER — CELECOXIB 200 MG PO CAPS: 200.0000 mg | ORAL_CAPSULE | Freq: Every day | ORAL | 1 refills | Status: DC

## 2020-11-29 ENCOUNTER — Encounter: Payer: Self-pay | Admitting: Physician Assistant

## 2020-12-11 ENCOUNTER — Other Ambulatory Visit: Payer: Self-pay | Admitting: Physician Assistant

## 2020-12-11 MED ORDER — TRAZODONE HCL 50 MG PO TABS
100.0000 mg | ORAL_TABLET | Freq: Every evening | ORAL | 1 refills | Status: DC | PRN
Start: 2020-12-11 — End: 2021-03-06

## 2020-12-22 ENCOUNTER — Other Ambulatory Visit: Payer: Self-pay | Admitting: Physician Assistant

## 2021-02-12 ENCOUNTER — Telehealth: Payer: Self-pay

## 2021-02-12 NOTE — Telephone Encounter (Signed)
Pt was in town today for her significant other's court date and she stopped by the clinic. Pt reported she is in the process of moving and lost her prior handicap placard form. A new form was given to her per Dr. Zollie Pee orders.

## 2021-03-02 ENCOUNTER — Encounter: Payer: Self-pay | Admitting: Family Medicine

## 2021-03-06 MED ORDER — LAMOTRIGINE 25 MG PO TABS: 50.0000 mg | ORAL_TABLET | Freq: Every day | ORAL | 1 refills | Status: AC

## 2021-03-06 MED ORDER — BUSPIRONE HCL 7.5 MG PO TABS: 7.5000 mg | ORAL_TABLET | Freq: Two times a day (BID) | ORAL | 6 refills | Status: AC

## 2021-03-06 MED ORDER — CELECOXIB 200 MG PO CAPS: 200.0000 mg | ORAL_CAPSULE | Freq: Every day | ORAL | 1 refills | Status: AC

## 2021-03-06 MED ORDER — BUPROPION HCL ER (XL) 150 MG PO TB24: 150.0000 mg | ORAL_TABLET | ORAL | 2 refills | Status: AC

## 2021-03-06 MED ORDER — CLONAZEPAM 0.5 MG PO TABS: 0.5000 mg | ORAL_TABLET | Freq: Two times a day (BID) | ORAL | 1 refills | Status: AC

## 2021-03-06 MED ORDER — TRAZODONE HCL 50 MG PO TABS: 100.0000 mg | ORAL_TABLET | Freq: Every evening | ORAL | 1 refills | Status: AC | PRN

## 2021-03-06 MED ORDER — HYDROCODONE-ACETAMINOPHEN 5-325 MG PO TABS: 1.0000 | ORAL_TABLET | Freq: Every evening | ORAL | 0 refills | Status: DC | PRN

## 2021-04-05 MED ORDER — HYDROCODONE-ACETAMINOPHEN 5-325 MG PO TABS: 1.0000 | ORAL_TABLET | Freq: Every evening | ORAL | 0 refills | Status: DC | PRN

## 2021-04-05 NOTE — Addendum Note (Signed)
Addended by: Rodolph Bong on: 04/05/2021 07:41 AM   Modules accepted: Orders

## 2021-04-10 MED ORDER — HYDROCODONE-ACETAMINOPHEN 5-325 MG PO TABS: 1.0000 | ORAL_TABLET | Freq: Every evening | ORAL | 0 refills | Status: AC | PRN

## 2021-04-10 NOTE — Addendum Note (Signed)
Addended by: Rodolph Bong on: 04/10/2021 07:18 AM   Modules accepted: Orders

## 2022-11-12 ENCOUNTER — Encounter: Payer: Self-pay | Admitting: Physical Therapy
